# Patient Record
Sex: Female | Born: 1945 | Race: White | Hispanic: No | Smoking: Never smoker
Health system: Southern US, Community
[De-identification: ages and names within clinical notes are randomized; demographics above are authoritative.]

## PROBLEM LIST (undated history)

## (undated) DIAGNOSIS — N19 Unspecified kidney failure: Secondary | ICD-10-CM

## (undated) DIAGNOSIS — G4733 Obstructive sleep apnea (adult) (pediatric): Secondary | ICD-10-CM

## (undated) DIAGNOSIS — I509 Heart failure, unspecified: Secondary | ICD-10-CM

## (undated) DIAGNOSIS — I272 Pulmonary hypertension, unspecified: Secondary | ICD-10-CM

## (undated) DIAGNOSIS — I4891 Unspecified atrial fibrillation: Secondary | ICD-10-CM

## (undated) DIAGNOSIS — I340 Nonrheumatic mitral (valve) insufficiency: Secondary | ICD-10-CM

## (undated) DIAGNOSIS — I071 Rheumatic tricuspid insufficiency: Secondary | ICD-10-CM

## (undated) DIAGNOSIS — F410 Panic disorder [episodic paroxysmal anxiety] without agoraphobia: Secondary | ICD-10-CM

## (undated) DIAGNOSIS — I10 Essential (primary) hypertension: Secondary | ICD-10-CM

## (undated) DIAGNOSIS — E785 Hyperlipidemia, unspecified: Secondary | ICD-10-CM

## (undated) DIAGNOSIS — M1990 Unspecified osteoarthritis, unspecified site: Secondary | ICD-10-CM

## (undated) HISTORY — PX: TOTAL KNEE ARTHROPLASTY: SHX125

## (undated) HISTORY — PX: TONSILLECTOMY: SUR1361

## (undated) HISTORY — PX: HERNIA REPAIR: SHX51

---

## 2002-07-05 ENCOUNTER — Encounter: Admission: RE | Admit: 2002-07-05 | Discharge: 2002-07-05 | Payer: Self-pay | Admitting: *Deleted

## 2002-07-05 ENCOUNTER — Encounter: Admission: RE | Admit: 2002-07-05 | Discharge: 2002-10-03 | Payer: Self-pay | Admitting: *Deleted

## 2002-08-24 ENCOUNTER — Encounter: Admission: RE | Admit: 2002-08-24 | Discharge: 2002-08-24 | Payer: Self-pay | Admitting: *Deleted

## 2002-09-01 ENCOUNTER — Encounter: Admission: RE | Admit: 2002-09-01 | Discharge: 2002-11-30 | Payer: Self-pay | Admitting: *Deleted

## 2002-09-19 ENCOUNTER — Inpatient Hospital Stay (HOSPITAL_COMMUNITY): Admission: RE | Admit: 2002-09-19 | Discharge: 2002-09-24 | Payer: Self-pay | Admitting: *Deleted

## 2003-12-19 ENCOUNTER — Encounter (INDEPENDENT_AMBULATORY_CARE_PROVIDER_SITE_OTHER): Payer: Self-pay | Admitting: *Deleted

## 2003-12-19 ENCOUNTER — Observation Stay (HOSPITAL_COMMUNITY): Admission: RE | Admit: 2003-12-19 | Discharge: 2003-12-20 | Payer: Self-pay | Admitting: *Deleted

## 2007-11-03 HISTORY — PX: REPLACEMENT TOTAL KNEE: SUR1224

## 2008-06-27 ENCOUNTER — Inpatient Hospital Stay (HOSPITAL_COMMUNITY): Admission: RE | Admit: 2008-06-27 | Discharge: 2008-07-01 | Payer: Self-pay | Admitting: Orthopedic Surgery

## 2011-03-17 NOTE — Op Note (Signed)
NAME:  Kelly Dickson, Kelly Dickson              ACCOUNT NO.:  [id number]   MEDICAL RECORD NO.:  [id number]          PATIENT TYPE:  INP   LOCATION:  1617                         FACILITY:  [facility]   PHYSICIAN:  Ollen Gross, M.D.    DATE OF BIRTH:  03-22-1946   DATE OF PROCEDURE:  06/27/2008  DATE OF DISCHARGE:                               OPERATIVE REPORT   PREOPERATIVE DIAGNOSIS:  Osteoarthritis right knee.   POSTOPERATIVE DIAGNOSIS:  Osteoarthritis right knee.   PROCEDURE:  Right total knee arthroplasty.   SURGEON:  Dr. Lequita Halt   ASSISTANT:  Avel Peace PA-C   ANESTHESIA:  General with postop Marcaine pain pump.   ESTIMATED BLOOD LOSS:  Minimal.   DRAINS:  None.   TOURNIQUET TIME:  Tourniquet not utilized.   COMPLICATIONS:  None.   CONDITION.:  Stable to recovery.   CLINICAL NOTE:  Ms. Blakney is a 65 year old female with end-stage  arthritis of the right knee with progressively worsening pain and  dysfunction.  She has failed nonoperative management and presents for  total knee arthroplasty.   PROCEDURE IN DETAIL:  After successful administration of general  anesthetic a tourniquet placed on the right thigh and right lower  extremity prepped and draped in the usual sterile fashion.  She was  wrapped in Esmarch, knee flexed and tourniquet inflated to 300 mmHg.  Midline incision made with 10 blade through subcutaneous tissue to the  level of the extensor mechanism.  A fresh blade is used make a medial  parapatellar arthrotomy.  At this point it is obvious tourniquet is not  functioning and is serving as a venous tourniquet.  We subsequently  released the tourniquet after having up 3 minutes.  The bleeding was  minimal after I released the tourniquet.  The soft tissue over the  proximal medial tibia is subperiosteally elevated to the joint line with  the knife into the semimembranosus bursa with a Cobb elevator.  Soft  tissue laterally is elevated with attention being paid to avoid  the  patellar tendon on tibial tubercle.  Patella subluxed laterally, knee  flexed 90 degrees and ACL and PCL removed.  Drill was used for a  starting hole in the distal femur and canal was thoroughly irrigated.  The 5 degrees right valgus alignment guide placed referencing off the  posterior condyles, rotations marked on the block pinned to remove 11 mm  of distal femur.  I took 11 because of preop flexion contracture.  Distal femoral resection is made with an oscillating saw.  Sizing blocks  placed and size 4 is most appropriate in the AP plane, 3 most  appropriate in the mediolateral plane, thus 4 narrow will be the femoral  component used.  The size 4 cutting block is then placed with the  rotation marked at the epicondylar axis.  The anterior-posterior chamfer  cuts are made.   The tibia subluxed forward and the menisci removed.  The extramedullary  tibial alignment guide is placed referencing proximally at the medial  aspect of the tibial tubercle and distally along the second metatarsal  axis tibial crest.  The  block is pinned to remove 10 mm of the non  deficient lateral side.  Tibial resection is made with an oscillating  saw.  Size 3 is most appropriate tibial component and the proximal tibia  prepared the modular drill and keel punch for size 3.  Femoral  preparation is completed the intercondylar cut for the size 4.   Size 3 mobile bearing tibial trial, size 4 narrow posterior stabilized  femoral trial and 10-mm posterior stabilized rotating platform insert  trial placed.  With 10 full extension is achieved with excellent varus-  valgus anterior-posterior balance throughout full range of motion.  The  patella was everted, thickness measured to be 22 mm.  Freehand resection  taken to 13 mm, 35 template is placed, lug holes were drilled, trial  patella was placed and it tracks normally.  Osteophytes removed off the  posterior femur with the trial in place.  All trials removed  and the cut  bone surfaces prepared with pulsatile lavage.  Cement was mixed and once  ready for implantation the size 3 mobile bearing tibial tray size 4  narrow posterior stabilized femur and 35 patella are cemented into place  and patella is held with a clamp.  Trial 10-mm inserts placed, knee held  in full extension, all extruded cement removed.  Cement fully hardened  and the permanent 10 mm posterior stabilized rotating platform insert is  placed into the tibial tray.  Wound was copiously irrigated saline  solution and then FloSeal injected on the posterior capsule,  mediolateral gutters and suprapatellar area.  The moist sponge is placed  and then held for 2 minutes.  This effectively stopped all bleeding.  The wounds again irrigated and the arthrotomy closed with interrupted #1  PDS.  Flexion against gravity to 135 degrees.  Subcu closed with  interrupted 2-0 Vicryl subcuticular running 4-0 Monocryl.  Catheter for  Marcaine pain pump is placed and the pump initiated.  Steri-Strips and  bulky sterile dressing applied.  She is placed into a knee immobilizer,  awakened, transferred to recovery in stable condition.      Ollen Gross, M.D.  Electronically Signed     FA/MEDQ  D:  06/27/2008  T:  06/28/2008  Job:  [id number]

## 2011-03-17 NOTE — H&P (Signed)
NAME:  Kelly Dickson, Kelly Dickson              ACCOUNT NO.:  [id number]   MEDICAL RECORD NO.:  [id number]          PATIENT TYPE:  INP   LOCATION:  0006                         [facility]ACILITY:  [facility]   PHYSICIAN:  Ollen Gross, M.D.    DATE OF BIRTH:  1946-09-22   DATE OF ADMISSION:  06/27/2008  DATE OF DISCHARGE:                              HISTORY & PHYSICAL   CHIEF COMPLAINT:  Right knee pain.   HISTORY OF PRESENT ILLNESS:  The patient is a 65 year old female who has  seen by Dr. Lequita Halt for ongoing right knee pain.  She has known bone-on-  bone patellofemoral medial severe end-stage arthritis that has been  refractory to conservative management, now presents for surgery.  She  has been seen and felt to be stable for surgery.   ALLERGIES:  ALEVE.   CURRENT MEDICATIONS:  Lodine, Norvasc, Altace, Wellbutrin/bupropion,  HydroDIURIL.   PAST MEDICAL HISTORY:  1. Depression.  2. Hypertension.   PAST SURGICAL HISTORY:  1. Adenoids, tonsils.  2. Lipoma.  3. Gastric bypass.  4. Two ventral hernia repairs.   FAMILY HISTORY:  Father with abdominal aneurysm.  Mother with history of  severe hydration.  Maternal grandmother blood clot after hysterectomy.   SOCIAL HISTORY:  Divorced, retired [employer].  Nonsmoker.  Seldom intake of alcohol.  Lives alone.  Sister will be  assisting with care after surgery.   REVIEW OF SYSTEMS:  GENERAL:  No fevers, chills or night sweats.  NEUROLOGICAL:  No seizures, syncope or paralysis.  RESPIRATORY:  No  shortness breath, productive cough or hemoptysis.  Does have occasional  seasonal allergies.  CARDIOVASCULAR:  No chest pain, angina or  orthopnea. GASTROINTESTINAL:  No nausea, vomiting, diarrhea,  constipation.  GENITOURINARY:  Little bit of nocturia.  No dysuria,  hematuria.  MUSCULOSKELETAL:  Knee pain.   VITAL SIGNS:  Pulse 64, respirations 14, blood pressure 138/72.  GENERAL:  A 65 year old, white female well-nourished,  well-developed,  overweight, no acute distress, slightly anxious.  HEENT:  Normocephalic, atraumatic.  Pupils round and reactive.  Noted to  wear glasses.  EOMs intact.  NECK:  Supple.  CHEST:  Clear.  HEART:  Regular rate and rhythm with a faint systolic ejection murmur,  S1-S2.  ABDOMEN:  Soft, round, protuberant abdomen, bowel sounds present.  RECTAL-BREAST-GENITALIA:  Not done, not pertinent to present illness.  EXTREMITIES:  No effusion.  There is malalignment deformity, range of  motion 10-110, marked crepitus.   IMPRESSION:  Osteoarthritis, right knee.   PLAN:  The patient admitted to [facility] to undergo a right  total knee replacement arthroplasty.  Surgery will be performed by Dr.  Ollen Gross.      Alexzandrew L. Perkins, P.A.C.      Ollen Gross, M.D.  Electronically Signed    ALP/MEDQ  D:  06/26/2008  T:  06/27/2008  Job:  [id number]   cc:   Ollen Gross, M.D.  Fax: [phone]   Rema Fendt, N.P.  [facility]

## 2011-03-20 NOTE — Op Note (Signed)
   NAME:  Kelly Dickson, Kelly Dickson                        ACCOUNT NO.:  [id number]   MEDICAL RECORD NO.:  [id number]                   PATIENT TYPE:  INP   LOCATION:  X007                                 FACILITY:  [facility]   PHYSICIAN:  Sandria Bales. Ezzard Standing, M.D.               DATE OF BIRTH:  May 28, 1946   DATE OF PROCEDURE:  09/19/2002  DATE OF DISCHARGE:                                 OPERATIVE REPORT   PREOPERATIVE DIAGNOSIS:  Morbid obesity, planned gastrojejunal roux-en-Y  bypass.   POSTOPERATIVE DIAGNOSIS:  Morbid obesity, planned roux-en-Y gastrojejunal  bypass.   PROCEDURE:  Upper esophagoscopy.   SURGEON:  Kristine Garbe. Ezzard Standing, M.D.   ANESTHESIA:  General.   INDICATIONS FOR PROCEDURE:  The patient is undergoing a laparoscopic roux-en-  Y gastrojejunal bypass for morbid obesity, completed by Dr. Luan Pulling and  Dr. Smitty Cords Schermer.  They have now completed their stapled gastrojejunal  anastomosis, and I am endoscoping the patient for confirmation of the  anastomosis itself, the patency, and to recheck for leak.   DESCRIPTION OF PROCEDURE:  The patient had laparoscopes in. With pressure on  the abdomen, I passed the flexible Olympus endoscope without difficulty down  her mouth to the back of her throat. I identified  the esophagus down to the  GE junction which was right at 39 to 40 cm and got into the pouch. I did not  go through the anastomosis but visualized the anastomosis. I took photos of  the anastomosis. It looked like the anastomosis was about 2 to 3 cm below  the gastroesophageal junction. It was patent to the tune of approximately a  diameter of about 2 cm. There was no bleeding. There was no air leak. The  scope was then slowly withdrawn and it looked like a patent anastomosis  without leak and normal esophagus.   The patient tolerated the procedure well. Dr. Luan Pulling will dictate the  remainder of the operation; I am just doing the endoscopic portion.                                       Sandria Bales. Ezzard Standing, M.D.    DHN/MEDQ  D:  09/19/2002  T:  09/19/2002  Job:  [id number]   cc:   Vikki Ports, M.D.  [address], [address]  [place]  [place] [address]  Fax: [phone]

## 2011-03-20 NOTE — Discharge Summary (Signed)
NAME:  Kelly Dickson, Kelly Dickson              ACCOUNT NO.:  [id number]   MEDICAL RECORD NO.:  [id number]          PATIENT TYPE:  INP   LOCATION:  1617                         [facility]ACILITY:  [facility]   PHYSICIAN:  Ollen Gross, M.D.    DATE OF BIRTH:  [birth year]   DATE OF ADMISSION:  06/27/2008  DATE OF DISCHARGE:  07/01/2008                               DISCHARGE SUMMARY   ADMITTING DIAGNOSES:  1. Osteoarthritis, right knee.  2. Depression.  3. Hypertension.   DISCHARGE DIAGNOSES:  1. Osteoarthritis right knee status post right total knee replacement      arthroplasty.  2. Mild postop blood loss anemia, did not require transfusion.  3. Mild hypokalemia, improved.  4. Depression.  5. Hypertension.   PROCEDURE:  June 27, 2008, right total knee.  Surgeon, Dr. Lequita Halt.  Assistant, Avel Peace PA-C.  Anesthesia:  General with Marcaine pain  pump.   CONSULTATIONS:  None.   BRIEF HISTORY:  Ms. Seabolt is a 61-year female with end-stage arthritis  right knee, progressive worsening pain dysfunction, nonoperative  management, now presents for a total knee arthroplasty.   LABORATORY DATA:  Preop CBC showed hemoglobin of 13.2, hematocrit 40.0,  white cell count 8.0, platelets 287.  Serial CBCs followed.  Hemoglobin  dropped down to 9.9 then to 9.6.  last H&H was 9.1 and 27.2.  PT/PTT  preop 12.5 and 32, respectively.  INR 0.9.  Serial protimes followed.  PT/INR 24.0 and 2.0.  Chem panel on admission did show slightly low  potassium at 3.4.  Came back up to 3.9, last noted at 3.5.  Electrolytes  on the serial B-mets remained within normal limits.  Preop UA trace  ketones.  Otherwise, negative.  Blood group type B+.   EKG June 27, 2008, sinus bradycardia.  Septal infarct age  undetermined.  This is an unconfirmed EKG.   HOSPITAL COURSE:  The patient was admitted to [facility],  tolerated procedure well, later transferred to the recovery room on  orthopedic floor.  Started on PCA and p.o.  analgesic for pain control  following surgery.  Had some pain on the morning of day one but  controlled with medications.  Hemoglobin is down to 9.9.  Started on  iron.  Started back on blood pressure medications with parameters.  Blood pressure was elevated up to 200, was ranging from 150-200  postoperatively, so we did start her back on her home medications.  She  had not taken them as of yet postoperatively pressure did come under  better control, back down to 150 the next day, and had been under better  control once her medications were resumed.  Her therapy did well.  She  was up walking about 100 feet by day #2.  Dressing changed on day #2.  Incision looked good.  Hemoglobin was down just a little bit to 9.6, but  she was asymptomatic with this, continued on the iron.  Continued to  progress with physical therapy through day #3 and day #4.  She is up  walking 100 feet and 350 feet.  Encouraged medications and was  discharged home on June 04, 2008.   DISCHARGE/PLAN:  1. The patient discharged home on July 01, 2008.  2. Discharge diagnoses please see above.  3. Discharge medications:  Percocet, Robaxin, Nu-Iron, Coumadin.   DISCHARGE INSTRUCTIONS:  1. Activity:  Weightbearing as tolerated right lower extremity, total      knee protocol.  2. Follow-up 2 weeks.  3. Diet:  Heart-healthy diet.   DISPOSITION:  Home.   CONDITION ON DISCHARGE:  Improved.      Alexzandrew L. Perkins, P.A.C.      Ollen Gross, M.D.  Electronically Signed    ALP/MEDQ  D:  07/26/2008  T:  07/27/2008  Job:  [id number]   cc:   [facility] Rema Fendt, NP

## 2011-03-20 NOTE — Op Note (Signed)
NAME:  Kelly Dickson, Kelly Dickson                        ACCOUNT NO.:  [id number]   MEDICAL RECORD NO.:  [id number]                   PATIENT TYPE:  INP   LOCATION:  X007                                 FACILITY:  [facility]   PHYSICIAN:  Vikki Ports, M.D.         DATE OF BIRTH:  09-01-1946   DATE OF PROCEDURE:  09/19/2002  DATE OF DISCHARGE:                                 OPERATIVE REPORT   PREOPERATIVE DIAGNOSES:  1. Morbid obesity.  2. Ventral hernia.   POSTOPERATIVE DIAGNOSES:  1. Morbid obesity.  2. Ventral hernia.   PROCEDURE:  Laparoscopic Roux-en-Y gastric bypass, retrocolic antegastric  gastrojejunostomy with linear cutter.   SURGEON:  Vikki Ports, M.D.   ASSISTANT:  Sandria Bales. Ezzard Standing, M.D.   PROCTOR:  Gus Puma, M.D. [facility]).   ANESTHESIA:  General.   DESCRIPTION OF PROCEDURE:  The patient was taken to the operating room and  placed in a supine position.  After adequate anesthesia was induced using  endotracheal tube, Foley catheter was placed and the abdomen was prepped and  draped in the normal sterile fashion.  Using a Veress needle in the left  upper quadrant, pneumoperitoneum was obtained and then a blunt 5 mm trocar  was placed in the left upper quadrant.  Ventral hernia was identified.  A  small 5 mm trocar was placed in the left abdomen.  Contents of the hernia  sac were taken down.  The edges of the hernia defect could be visualized.  It measured about 16 x 16 cm.  Under direct visualization a 12 mm port was  placed in the infraumbilical region, and two additional 12 mm ports were  placed in the left upper quadrant.  Dissection began by identifying the  ligament of Treitz by retracting the transverse colon anteriorly.  The  ligament of Treitz was identified and measuring about 30 cm distal from the  ligament of Treitz, the small bowel was divided using a white-loaded GIA  stapling device.  Mesentery was also taken down with  two additional firings  of the GIA stapler.  Adequate hemostasis was ensured.  The distal divided  segment was then marked with a Penrose drain and 100 cm was calculated  distal to that division.  A side-to-side jejunojejunostomy was accomplished  using two firings of the white load GIA stapling device.  The defect was  closed with a running 2-0 Vicryl suture using the EndoStitch device.  Mesenteric defect was closed with interrupted 2-0 silk sutures.  At this  point a small defect in the transverse mesocolon was made just anterior and  lateral to the ligament of Treitz.  The Roux limb was then passed up behind  the transverse colon and into the lesser sac.   I then turned my attention to the stomach.  The EG junction was identified.  Then using the Harmonic scalpel, dissection of the angle of His was  undertaken.  I  had good visualization of the proximal portion of the spleen.  The gastrohepatic omentum was incised and an area about 4 cm distal to the  EG junction  on the lesser curve was identified.  Blunt and sharp dissection  was undertaken posterior to the stomach up to the angle of His.  The gastric  pouch was created using four firings of the GIA blue load and stapling  device.  This was accomplished with a 34 Ewald tube placed transorally.  After complete division of the gastric pouch, the Roux limb was difficult to  pull up posterior to the stomach, and therefore the gastrocolic ligament was  incised.  The Roux limb was visualized there.  There were a lot of adhesions  behind the stomach to the pancreas; therefore, I opted to bring the Roux  limb up anterior to the stomach wall.  After anchoring the Roux limb to the  gastric pouch, a side-to-side anastomosis was performed after a running 2-0  Vicryl stitch layer was placed in the posterior wall.  The stapling device  was fired once.  The anastomosis site appeared adequate.  The defect was  closed then with a running 2-0 Vicryl  suture using the EndoStitch device.  The anterior suture line was then reinforced with a running 2-0 Vicryl  suture.   Through a 5 mm incision in the right subcostal region, a liver retractor had  previously been placed and retracted the liver anteriorly.  After the  anastomosis was completed, endoscopy was performed by Dr. Ovidio Kin that  will be dictated in a separate report.  Endoscopy showed no evidence of  leak.  The upper abdomen was irrigated with saline, and the gastric pouch  and anastomosis were distended after distal obstruction.  There was no  evidence of leak.  The Roux limb was then tacked both medially and laterally  to the transverse mesocolon, and the jejunojejunostomy was also tacked just  distal to the anastomosis with an interrupted 2-0 silk suture.  I opted not  to repair the hernia at this time.  Adequate hemostasis was ensured.  The  fascial defects were closed using an EndoStitch device with interrupted 0  Vicryl suture.  Skin incisions were closed with subcutaneous 3-0 Vicryl and  the skin was closed with staples.  The patient tolerated the procedure well  and went to the PACU in good condition.                                               Vikki Ports, M.D.    KRH/MEDQ  D:  09/19/2002  T:  09/19/2002  Job:  [id number]   cc:   Teena Irani. Arlyce Dice, M.D.  [address]  [place]  [place] [address]  Fax: [phone]

## 2011-03-20 NOTE — Op Note (Signed)
NAME:  Kelly Dickson, Kelly Dickson                        ACCOUNT NO.:  [id number]   MEDICAL RECORD NO.:  [id number]                   PATIENT TYPE:  OBV   LOCATION:  0343                                 FACILITY:  [facility]   PHYSICIAN:  Vikki Ports, M.D.         DATE OF BIRTH:  Aug 29, 1946   DATE OF PROCEDURE:  12/19/2003  DATE OF DISCHARGE:  12/20/2003                                 OPERATIVE REPORT   PREOPERATIVE DIAGNOSES:  1. Ventral hernia.  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   POSTOPERATIVE DIAGNOSES:  1. Ventral hernia.  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   OPERATION/PROCEDURE:  1. Laparoscopic ventral hernia repair with mesh.  2. Excision of giant lipoma of the right upper quadrant.   SURGEON:  Vikki Ports, M.D.   ANESTHESIA:  General.   ASSISTANT:  Thornton Park. Daphine Deutscher, M.D.   DESCRIPTION OF PROCEDURE:  The patient was taken to the operating room and  placed in the supine position.  After adequate general anesthesia was  induced using the endotracheal tube, the abdomen was prepped and draped in  the normal sterile fashion.  Using a 12 mm incision in the left subcostal  region and Optiview technique, trocar was introduced into the abdomen.  Pneumoperitoneum was obtained.  Under direct visualization an additional 12  mm port was placed in the left lower quadrant and a 5 mm port in the right  lower quadrant.  Hernia was identified. Contents were reduced both by manual  palpation and traction intra-abdominally.  This was easily reduced.  A few  adhesions were taken down and the defect measured 8 cm x 8 cm.  A 12 cm  circular piece of Parietex dual-facing mesh was placed in the abdomen after  #1 Novofils had been placed in the periphery.  Those sutures were brought  out through the anterior fascia using a suture grasper.  These were ligated  down to the anterior fascia until the entire defect was closed.  The  periphery was tacked with a stapling  device.  I was satisfied with the  coverage of the hernia repair.  Adequate hemostasis was insured,  pneumoperitoneum was released.  Trocars were removed and the skin incisions  were closed with staples.   Right upper quadrant incision was made over the palpable lipoma.  It was  multilobulated and required significant dissection to remove adhesive bands.  The largest amount was removed en bloc but a number of smaller lipomas  were also excised.  Because of the large cavity, a drain was placed in the  cavity and the skin was closed with staples.  Sterile dressings were  applied.   The patient tolerated the procedure well and went to PACU in good condition.  Vikki Ports, M.D.    KRH/MEDQ  D:  12/20/2003  T:  12/21/2003  Job:  [id number]

## 2011-03-20 NOTE — Discharge Summary (Signed)
   NAME:  Kelly Dickson, Kelly Dickson                        ACCOUNT NO.:  [id number]   MEDICAL RECORD NO.:  [id number]                   PATIENT TYPE:  INP   LOCATION:  0473                                 FACILITY:  [facility]   PHYSICIAN:  Vikki Ports, M.D.         DATE OF BIRTH:  1946/04/06   DATE OF ADMISSION:  09/19/2002  DATE OF DISCHARGE:  09/24/2002                                 DISCHARGE SUMMARY   ADMISSION DIAGNOSES:  Morbid obesity.   DISCHARGE DIAGNOSES:  Morbid obesity.   PROCEDURE:  Laparoscopic Roux-en-Y gastric bypass.   CONSULTATIONS:  Juluis Mire, M.D., Meade Maw, M.D.   CONDITION ON DISCHARGE:  Good and improved.   FOLLOW UP:  With me seven days after surgery.   DISCHARGE MEDICATIONS:  Roxicet elixir 5-10 cubic centimeters p.o. q.4h.  p.r.n. pain.   DIET:  Strict close bariatric laparoscopic bypass diet as given to the  patient.   HISTORY OF PRESENT ILLNESS:  The patient is a 65 year old white female who  underwent extensive preoperative evaluation for morbid obesity.  The patient  presents for laparoscopic Roux-en-Y gastric bypass.  For the remainder of  the thorough H&P, please see the chart.   HOSPITAL COURSE:  The patient was admitted after home bowel prep, taken to  the operating room where she underwent laparoscopic Roux-en-Y gastric  bypass.  Endoscopy showed no evidence of leak and upper GI postoperatively  was normal as well.  The patient's hypertension required the increase of a  clonidine patch postoperatively and hypokalemia required supplemental  potassium.  The patient had minimal nausea over the first one to two days,  was started on a few ice chips and sips of Kool-Aid and Glucerna by  postoperative day number two.  Hypertension continued to improve over the  next three to four days requiring decrease of her clonidine patch.  On  postoperative day number four patient was tolerating her diet, was  ambulating well.  Dopplers showed no  evidence of lower extremity DVT and by postoperative day number five she was  feeling well, ambulating, was ready for discharge home.   FOLLOW UP:  With me one week after discharge.                                               Vikki Ports, M.D.    KRH/MEDQ  D:  11/22/2002  T:  11/22/2002  Job:  [id number]   cc:   Teena Irani. Arlyce Dice, M.D.  [id number]  [place]  [place] [address]  Fax: [phone]

## 2011-07-13 ENCOUNTER — Encounter (HOSPITAL_COMMUNITY): Payer: Medicare Other

## 2011-07-13 ENCOUNTER — Other Ambulatory Visit: Payer: Self-pay | Admitting: Orthopedic Surgery

## 2011-07-13 ENCOUNTER — Other Ambulatory Visit (HOSPITAL_COMMUNITY): Payer: Self-pay | Admitting: Orthopedic Surgery

## 2011-07-13 ENCOUNTER — Ambulatory Visit (HOSPITAL_COMMUNITY)
Admission: RE | Admit: 2011-07-13 | Discharge: 2011-07-13 | Disposition: A | Discharge status: Home / Self Care | Payer: Medicare Other | Source: Ambulatory Visit | Attending: Orthopedic Surgery | Admitting: Orthopedic Surgery

## 2011-07-13 DIAGNOSIS — I10 Essential (primary) hypertension: Secondary | ICD-10-CM | POA: Insufficient documentation

## 2011-07-13 DIAGNOSIS — Z01810 Encounter for preprocedural cardiovascular examination: Secondary | ICD-10-CM | POA: Insufficient documentation

## 2011-07-13 DIAGNOSIS — M1710 Unilateral primary osteoarthritis, unspecified knee: Secondary | ICD-10-CM | POA: Insufficient documentation

## 2011-07-13 DIAGNOSIS — R9431 Abnormal electrocardiogram [ECG] [EKG]: Secondary | ICD-10-CM | POA: Insufficient documentation

## 2011-07-13 DIAGNOSIS — I517 Cardiomegaly: Secondary | ICD-10-CM | POA: Insufficient documentation

## 2011-07-13 DIAGNOSIS — Z01812 Encounter for preprocedural laboratory examination: Secondary | ICD-10-CM | POA: Insufficient documentation

## 2011-07-13 DIAGNOSIS — Z01818 Encounter for other preprocedural examination: Secondary | ICD-10-CM | POA: Insufficient documentation

## 2011-07-13 LAB — COMPREHENSIVE METABOLIC PANEL
ALT: 15 U/L (ref 0–35)
BUN: 16 mg/dL (ref 6–23)
CO2: 28 mEq/L (ref 19–32)
Calcium: 9.8 mg/dL (ref 8.4–10.5)
Creatinine, Ser: 0.5 mg/dL (ref 0.50–1.10)
GFR calc Af Amer: >60 mL/min (ref >60)
GFR calc non Af Amer: >60 mL/min (ref >60)
Glucose, Bld: 109 mg/dL — ABNORMAL HIGH (ref 70–99)
Total Protein: 7.2 g/dL (ref 6.0–8.3)

## 2011-07-13 LAB — URINALYSIS, ROUTINE W REFLEX MICROSCOPIC
Glucose, UA: NEGATIVE mg/dL
Ketones, ur: NEGATIVE mg/dL
Nitrite: NEGATIVE
Protein, ur: NEGATIVE mg/dL
Urobilinogen, UA: 0.2 mg/dL (ref 0.0–1.0)

## 2011-07-13 LAB — PROTIME-INR: Prothrombin Time: 13 seconds (ref 11.6–15.2)

## 2011-07-13 LAB — CBC
Hemoglobin: 11.6 g/dL — ABNORMAL LOW (ref 12.0–15.0)
MCH: 27.6 pg (ref 26.0–34.0)
MCHC: 31.9 g/dL (ref 30.0–36.0)
Platelets: 266 10*3/uL (ref 150–400)
RDW: 13.9 % (ref 11.5–15.5)

## 2011-07-13 LAB — APTT: aPTT: 33 seconds (ref 24–37)

## 2011-07-13 LAB — URINE MICROSCOPIC-ADD ON

## 2011-07-20 ENCOUNTER — Inpatient Hospital Stay (HOSPITAL_COMMUNITY)
Admission: RE | Admit: 2011-07-20 | Discharge: 2011-07-23 | DRG: 470 | Disposition: A | Discharge status: Skilled Nursing Facility | Payer: Medicare Other | Source: Ambulatory Visit | Attending: Orthopedic Surgery | Admitting: Orthopedic Surgery

## 2011-07-20 DIAGNOSIS — Z01812 Encounter for preprocedural laboratory examination: Secondary | ICD-10-CM

## 2011-07-20 DIAGNOSIS — Z9884 Bariatric surgery status: Secondary | ICD-10-CM

## 2011-07-20 DIAGNOSIS — M899 Disorder of bone, unspecified: Secondary | ICD-10-CM | POA: Diagnosis present

## 2011-07-20 DIAGNOSIS — Z780 Asymptomatic menopausal state: Secondary | ICD-10-CM

## 2011-07-20 DIAGNOSIS — I10 Essential (primary) hypertension: Secondary | ICD-10-CM | POA: Diagnosis present

## 2011-07-20 DIAGNOSIS — Z96659 Presence of unspecified artificial knee joint: Secondary | ICD-10-CM

## 2011-07-20 DIAGNOSIS — E876 Hypokalemia: Secondary | ICD-10-CM | POA: Diagnosis not present

## 2011-07-20 DIAGNOSIS — E871 Hypo-osmolality and hyponatremia: Secondary | ICD-10-CM | POA: Diagnosis not present

## 2011-07-20 DIAGNOSIS — Z79899 Other long term (current) drug therapy: Secondary | ICD-10-CM

## 2011-07-20 DIAGNOSIS — D62 Acute posthemorrhagic anemia: Secondary | ICD-10-CM | POA: Diagnosis not present

## 2011-07-20 DIAGNOSIS — M1710 Unilateral primary osteoarthritis, unspecified knee: Principal | ICD-10-CM | POA: Diagnosis present

## 2011-07-20 DIAGNOSIS — F329 Major depressive disorder, single episode, unspecified: Secondary | ICD-10-CM | POA: Diagnosis present

## 2011-07-20 DIAGNOSIS — M21169 Varus deformity, not elsewhere classified, unspecified knee: Secondary | ICD-10-CM | POA: Diagnosis present

## 2011-07-20 DIAGNOSIS — F3289 Other specified depressive episodes: Secondary | ICD-10-CM | POA: Diagnosis present

## 2011-07-20 LAB — TYPE AND SCREEN
ABO/RH(D): B POS
Antibody Screen: NEGATIVE

## 2011-07-21 LAB — CBC
HCT: 28.4 % — ABNORMAL LOW (ref 36.0–46.0)
MCV: 86.9 fL (ref 78.0–100.0)
Platelets: 203 10*3/uL (ref 150–400)
RBC: 3.27 MIL/uL — ABNORMAL LOW (ref 3.87–5.11)
WBC: 8.4 10*3/uL (ref 4.0–10.5)

## 2011-07-21 LAB — BASIC METABOLIC PANEL
CO2: 27 mEq/L (ref 19–32)
Chloride: 100 mEq/L (ref 96–112)
Creatinine, Ser: 0.48 mg/dL — ABNORMAL LOW (ref 0.50–1.10)
Potassium: 4.1 mEq/L (ref 3.5–5.1)

## 2011-07-22 LAB — BASIC METABOLIC PANEL
BUN: 7 mg/dL (ref 6–23)
Calcium: 8.7 mg/dL (ref 8.4–10.5)
Chloride: 97 mEq/L (ref 96–112)
Creatinine, Ser: 0.5 mg/dL (ref 0.50–1.10)
GFR calc Af Amer: >60 mL/min (ref >60)

## 2011-07-22 LAB — CBC
HCT: 27.8 % — ABNORMAL LOW (ref 36.0–46.0)
Hemoglobin: 9 g/dL — ABNORMAL LOW (ref 12.0–15.0)
MCV: 86.6 fL (ref 78.0–100.0)
RDW: 13.6 % (ref 11.5–15.5)
WBC: 11.5 10*3/uL — ABNORMAL HIGH (ref 4.0–10.5)

## 2011-07-22 NOTE — Op Note (Signed)
NAMEMarland Dickson  AUTUM, BENFER NO.:  [id number]  MEDICAL RECORD NO.:  [id number]  LOCATION:  X003                         FACILITY:  [facility]  PHYSICIAN:  Ollen Gross, M.D.    DATE OF BIRTH:  February 27, 1946  DATE OF PROCEDURE:  07/20/2011 DATE OF DISCHARGE:                              OPERATIVE REPORT   PREOPERATIVE DIAGNOSIS:  Osteoarthritis, left knee.  POSTOPERATIVE DIAGNOSIS:  Osteoarthritis, left knee.  PROCEDURE:  Left total knee arthroplasty.  SURGEON:  Ollen Gross, MD  ASSISTANT:  Alexzandrew L. Perkins, PA-C  ANESTHESIA:  General.  ESTIMATED BLOOD LOSS:  Minimal.  TOURNIQUET TIME:  17 minutes at 300 mmHg.  COMPLICATIONS:  None.  CONDITION:  Stable to Recovery.  BRIEF CLINICAL NOTE:  Kelly Dickson is a 65 year old female with advanced end- stage arthritis of her left knee with severe bone on bone change and severe varus deformity.  She has had a previous successful right total knee.  She has failed injections and other nonoperative management on the left knee.  She presents for left total knee arthroplasty.  PROCEDURE IN DETAIL:  After successful administration of general anesthetic, a tourniquet was placed high on her left thigh, and her left lower extremity was prepped and draped in usual sterile fashion. Extremity was wrapped in an Esmarch, knee flexed, tourniquet inflated to 300 mmHg.  A midline incision was made with a 10 blade through subcutaneous tissue to the level of the extensor mechanism.  A fresh blade was used to make a medial parapatellar arthrotomy.  Soft tissue on the proximal medial tibia subperiosteally elevated to the joint line with the knife into the semimembranosus bursa with a Cobb elevator. Soft tissue laterally was elevated with attention being paid to avoid any patellar tendon on tibial tubercle.  Patella was everted, the flexed 90 degrees, and ACL and PCL removed.  She has severe bone on bone change in medial compartment with  a large erosion in the medial proximal tibia. A drill was used to create a starting hole in the distal femur and the canal was thoroughly irrigated.  The 5-degree left valgus alignment guide was placed and a distal femoral cutting block was pinned to remove 11 mm off the distal femur.  Distal femoral resection was made with an oscillating saw.  The tibia was then subluxed forward and the menisci removed.  She has a massive defect medially.  Extramedullary alignment guide was placed referencing proximally at the medial aspect of tibial tubercle and distally along the 2nd metatarsal axis and tibial crest.  If I was going to resect all the way down to the bottom of the defect that would have created 2 larger resection level and very large flexion/extension gaps. I resected at the normal level which was about 8 mm off the less deficient lateral side.  Resection was made with an oscillating saw. She though has a fairly significant defect medially.  I prepared the proximal tibia with a modular drill and keel punched for a size 2.5.  I then resected additional 5 mm of the medial side for placement of a 5 mm wedge medially.  We then drilled down deeper into the tibia for placement of the  M.B.T. revision stem.  We did set up a trial with a size 2.5 with a 5 mm augment medially.  This was impacted with perfect fit on the tibia.  We then used a keel punch to stabilize it.  The femoral sizing guide was placed and size 3 was most appropriate. Rotation was marked off the epicondylar axis.  Size 3 cutting block was placed and the anterior and posterior chamfer cuts were made. Intercondylar block was placed and that cut was made.  Trial size 3 posterior stabilized femur was then placed.  A 10 mm posterior stabilized rotating platform insert trial was placed.  With the 10, full extension was achieved with excellent varus-valgus, anterior-posterior balance, throughout full range of motion.  The patella  was everted and the thickness was measured to be 22 mm.  Freehand resection was taken to 12 mm, 35 template was placed, lug holes were drilled, trial patella was placed, and it tracked normally.  Osteophytes were removed off the posterior femur with the trial in place.  All trials were removed and the cut bone surfaces were prepared with pulsatile lavage.  Cement was mixed and once ready for implantation, the size 2.5 M.B.T. revision tray with a 5 mm augment medially was cemented into the tibia and impacted in place.  All extruded cement was removed.  Size 3 posterior stabilized femur and 35 patella were also cemented into place and the patella was held with a clamp.  Trial 10 mm insert was placed, knee held in full extension, and all extruded cement removed.  When the cement was fully hardened, then the permanent 10 mm posterior stabilized rotating platform insert was placed in the tibial tray.  Wound was copiously irrigated with saline solution and the arthrotomy closed over Hemovac drain with interrupted #1 PDS.  Flexion against gravity was 135 degrees and patella tracked normally. The tourniquet had been released at 17 minutes because it was not functioning.  Note that we did the majority of the case with the tourniquet down.  Subcutaneous was then closed with interrupted 2-0 Vicryl and subcuticular with running 4-0 Monocryl. Catheter for Marcaine pain pump was placed and pump was initiated. Steri-Strips and a bulky sterile dressing applied and she was placed into a knee immobilizer, awakened, and transported to Recovery in stable condition.  Please note that it was medical necessity to have a surgical assistant on this procedure in order to safely and expeditiously perform it. Surgical assistant was necessary for retraction of ligaments and vital neurovascular structures as well as positioning of the leg to allow for appropriately aligned implants.     Ollen Gross,  M.D.     FA/MEDQ  D:  07/20/2011  T:  07/20/2011  Job:  [id number]  Electronically Signed by Ollen Gross M.D. on 07/22/2011 10:10:02 AM

## 2011-07-23 LAB — BASIC METABOLIC PANEL
BUN: 9 mg/dL (ref 6–23)
Creatinine, Ser: <0.47 mg/dL — ABNORMAL LOW (ref 0.50–1.10)
Potassium: 3.4 mEq/L — ABNORMAL LOW (ref 3.5–5.1)

## 2011-07-23 LAB — CBC
HCT: 26.7 % — ABNORMAL LOW (ref 36.0–46.0)
MCHC: 32.6 g/dL (ref 30.0–36.0)
Platelets: 195 10*3/uL (ref 150–400)
RDW: 13.6 % (ref 11.5–15.5)

## 2011-07-29 NOTE — H&P (Signed)
NAME:  Kelly Dickson, STOFFEL NO.:  [id number]  MEDICAL RECORD NO.:  [id number]  LOCATION:                               FACILITY:  [facility]  PHYSICIAN:  Ollen Gross, M.D.    DATE OF BIRTH:  02-Jun-1946  DATE OF ADMISSION:  07/20/2011 DATE OF DISCHARGE:                             HISTORY & PHYSICAL   CHIEF COMPLAINT:  Left knee pain.  HISTORY OF PRESENT ILLNESS:  The patient is a 65 year old female who has previously undergone a right total knee back in August 2009.  Right knee is doing well, but the left knee continues to be problematic.  She is known to have end-stage arthritis and has already developed bone on bone.  She has been treated conservatively in the past for knee with analgesics including injections.  Despite conservative measures, she has progressive pain.  It is felt she would benefit from undergoing surgical intervention.  Risks and benefits have been discussed, she elected to proceed with surgery.  She does not have any contraindications for the surgery such as ongoing infection or progressive neurological disease.  ALLERGIES:  No known drug allergies.  CURRENT MEDICATIONS:  Ramipril, amlodipine, hydrochlorothiazide, oxycodone.  PAST MEDICAL HISTORY: 1. Hypertension. 2. History of depression. 3. Osteopenia. 4. Postmenopausal. 5. Childhood illnesses measles, mumps.  PAST SURGICAL HISTORY:  Tonsils and adenoids surgery in 1950s, tubal ligation in 1971, gastric bypass surgery in 2003, ventral hernia in 2005, ventral hernia repair again in 2007, right total knee replacement in August 2009.  SOCIAL HISTORY:  Divorced, nonsmoker, no alcohol, 4 children.  She does want to look into skilled nursing facility, possibly [facility].  She has 9 steps going into the front of her house and 17 steps coming in from her garage.  FAMILY HISTORY:  Father with an abdominal aneurysm.  Mother with severe dehydration, problems during a rehab stay at assisted  living.  REVIEW OF SYSTEMS:  GENERAL:  No fever, chills, night sweats.  NEURO: No seizures, syncope, or paralysis.  RESPIRATORY:  No shortness of breath, productive cough, or hemoptysis.  CARDIOVASCULAR:  No chest pain, orthopnea.  GI:  No nausea, vomiting, diarrhea, or constipation. GU:  No dysuria, hematuria, or discharge.  MUSCULOSKELETAL:  Knee pain.  PHYSICAL EXAMINATION:  VITAL SIGNS:  Pulse 72, respirations 14, blood pressure 173/88. GENERAL:  A 65 year old white female well nourished, well developed, overweight, obese, hip and thigh obesity.  She is alert, oriented, cooperative, pleasant. HEENT:  Normocephalic, atraumatic.  Pupils round, reactive.  EOMs intact. NECK:  Supple. CHEST:  Clear. HEART:  Heart is regular rate and rhythm with a grade 2/6 systolic ejection murmur, S1 and S2 noted. ABDOMEN:  Soft, round, protuberant abdomen.  Bowel sounds present. RECTAL/BREAST/GENITALIA:  Not done, not pertinent to present illness. EXTREMITIES:  Left knee range of motion 5 to 120, marked crepitus, tender more medial than lateral.  No instability.  IMPRESSION:  Osteoarthritis, left knee.  PLAN:  The patient will be admitted to [facility] to undergo a left total knee replacement arthroplasty.  Surgery will be performed by Dr. Ollen Gross.  She does want to look into [facility] following her hospital stay.  Alexzandrew L. Julien Girt, P.A.C.   ______________________________ Ollen Gross, M.D.    ALP/MEDQ  D:  07/19/2011  T:  07/19/2011  Job:  [id number]  cc:   Rema Fendt, FNP [facility]  Electronically Signed by Patrica Duel P.A.C. on 07/23/2011 11:34:59 AM Electronically Signed by Ollen Gross M.D. on 07/29/2011 10:33:12 AM

## 2011-08-12 NOTE — Discharge Summary (Signed)
NAMEMarland Kitchen  Kelly Dickson, Kelly Dickson              ACCOUNT NO.:  [id number]  MEDICAL RECORD NO.:  [id number]  LOCATION:  1609                         FACILITY:  [facility]  PHYSICIAN:  Alexzandrew L. Perkins, P.A.C.DATE OF BIRTH:  1946/10/23  DATE OF ADMISSION:  07/20/2011 DATE OF DISCHARGE:  07/23/2011                        DISCHARGE SUMMARY - REFERRING   ADMITTING DIAGNOSES: 1. Osteoarthritis, left knee. 2. Hypertension. 3. History of depression. 4. Osteopenia. 5. Postmenopausal. 6. Childhood illnesses of measles and mumps.  DISCHARGE DIAGNOSES: 1. Osteoarthritis, left knee, status post left total knee replacement     and arthroplasty. 2. Mild postop acute blood loss anemia, did not require transfusion. 3. Mild postop hyponatremia, stable. 4. Mild postop hypokalemia. 5. Hypertension. 6. History of depression. 7. Osteopenia. 8. Postmenopausal. 9. Childhood illnesses of measles and mumps.  PROCEDURE:  July 20, 2011, left total knee; surgeon Dr. Lequita Halt; assistant Alexzandrew Julien Girt, PA-C; anesthesia general; tourniquet time 17 minutes.  CONSULTS:  None.  BRIEF HISTORY:  The patient is a 65 year old female with advanced arthritis of left knee with severe bone-on-bone changes and severe varus deformity, successful right total knee and now presents for left total knee.  LABORATORY DATA:  Preop CBC showed a hemoglobin of 11.6, hematocrit 36.4, white cell count 7.8 and platelets 266.  PT/INR preop 13.0 and 0.96 with a PTT of 33.  Chem panel all within normal limits.  Preop UA, small leukocytes, rare squamous 7-10 white cells, rare bacteria.  Blood group type B+.  Nasal swabs were positive for Staph aureus, but negative for MRSA.  Serial CBCs were followed.  Hemoglobin dropped down from 9.1 to 9, last night H and H was 8.7 and 26.7.  Serial BMETs were followed for 3 days.  Sodium dropped from 138 to 132, stabilized at 132. Potassium dropped from 3.8 to 3.4.  Remaining of the  electrolytes remained within normal limits.  Glucose went up from 109 up to 147, was trending back down to 143.  HOSPITAL COURSE:  The patient was admitted to [facility], taken to OR, underwent above-stated procedure without complication.  The patient tolerated the procedure well, later transferred to recovery room of the orthopedic floor, started Xarelto for DVT prophylaxis, given 24 hours postop IV antibiotics.  Had a little bit of itching through the night which she was treated with Benadryl.  She did want to look into [facility].  We got social work involved postoperatively.  Hemovac drain was pulled and discontinued on postop day #1.  Her blood count was a little low, so we put her on some iron supplementation.  Sodium was a little low, felt be a dilutional component.  She started diuresing fluids well, though, was started back on home meds.  She did a little stand pivot on day #1; by day #2, she was still a little bit better, she started actually walking about 30 feet in the morning, got up to about 70 feet the afternoon.  We did change the dressing on day #2 and the incision looked good.  We stopped her fluids.  Sodium was at 132.  Other electrolytes remained within normal limits.  Hemoglobin was 9 at that point and she was asymptomatic.  Continued to  progress well.  She was seen on rounds on postop day #3 by Dr. Lequita Halt.  She was doing fine, no complaints and it was decided that she be transferred over to [facility] for continued postoperative care.  DISCHARGE/PLAN:  The patient was discharged over to [facility] on 07/23/2011.  DISCHARGE DIAGNOSES:  Please see above.  DISCHARGE MEDICATIONS:  Current medications at time of transfer include; 1. Xarelto 10 mg p.o. daily for 3 weeks, then discontinue the Xarelto. 2. Colace 100 mg p.o. b.i.d. 3. Hydrochlorothiazide 12.5 mg daily. 4. Altace 10 mg p.o. daily. 5. Norvasc 5 mg p.o. daily. 6. Nu-Iron 150 mg p.o.  daily for 3 weeks, then discontinue the Nu-     Iron. 7. OxyIR 5 mg 1 or 2 tablets every 4 hours as needed for moderate-to-     severe pain. 8. Tylenol 325 1 or 2 every 4-6 hours as needed for mild to moderate     pain. 9. Laxative of choice. 10.Enema of choice. 11.Robaxin 500 mg p.o. q.6-8 h. p.r.n. spasm.  DIET:  Heart-healthy diet.  ACTIVITY:  She is weightbearing as tolerated, total knee protocol.  PT and OT for gait training, ambulation, ADLs, range of motion and strengthening exercises per continued total knee protocol.  Please note that the patient may start showering, however, do not submerge incision under water.  FOLLOWUP:  2 weeks.  DISPOSITION:  [facility].  CONDITION ON DISCHARGE:  Improved.     Alexzandrew L. Perkins, P.A.C.     ALP/MEDQ  D:  07/23/2011  T:  07/23/2011  Job:  [id number]  cc:   Rema Fendt, FNP [facility]  Electronically Signed by Patrica Duel P.A.C. on 07/23/2011 11:34:47 AM Electronically Signed by Ollen Gross M.D. on 08/12/2011 11:15:52 AM

## 2015-11-25 ENCOUNTER — Emergency Department (HOSPITAL_BASED_OUTPATIENT_CLINIC_OR_DEPARTMENT_OTHER): Payer: Medicare Other

## 2015-11-25 ENCOUNTER — Inpatient Hospital Stay (HOSPITAL_BASED_OUTPATIENT_CLINIC_OR_DEPARTMENT_OTHER)
Admission: EM | Admit: 2015-11-25 | Discharge: 2015-11-30 | DRG: 603 | Disposition: A | Discharge status: Home / Self Care | Payer: Medicare Other | Attending: Internal Medicine | Admitting: Internal Medicine

## 2015-11-25 ENCOUNTER — Encounter (HOSPITAL_BASED_OUTPATIENT_CLINIC_OR_DEPARTMENT_OTHER): Payer: Self-pay

## 2015-11-25 DIAGNOSIS — I340 Nonrheumatic mitral (valve) insufficiency: Secondary | ICD-10-CM | POA: Diagnosis present

## 2015-11-25 DIAGNOSIS — I82812 Embolism and thrombosis of superficial veins of left lower extremities: Secondary | ICD-10-CM | POA: Diagnosis present

## 2015-11-25 DIAGNOSIS — E876 Hypokalemia: Secondary | ICD-10-CM | POA: Diagnosis present

## 2015-11-25 DIAGNOSIS — E785 Hyperlipidemia, unspecified: Secondary | ICD-10-CM | POA: Insufficient documentation

## 2015-11-25 DIAGNOSIS — I272 Other secondary pulmonary hypertension: Secondary | ICD-10-CM | POA: Diagnosis present

## 2015-11-25 DIAGNOSIS — I5022 Chronic systolic (congestive) heart failure: Secondary | ICD-10-CM | POA: Diagnosis present

## 2015-11-25 DIAGNOSIS — M7989 Other specified soft tissue disorders: Secondary | ICD-10-CM | POA: Diagnosis present

## 2015-11-25 DIAGNOSIS — Z7982 Long term (current) use of aspirin: Secondary | ICD-10-CM | POA: Diagnosis not present

## 2015-11-25 DIAGNOSIS — I071 Rheumatic tricuspid insufficiency: Secondary | ICD-10-CM | POA: Diagnosis present

## 2015-11-25 DIAGNOSIS — F410 Panic disorder [episodic paroxysmal anxiety] without agoraphobia: Secondary | ICD-10-CM | POA: Diagnosis not present

## 2015-11-25 DIAGNOSIS — I509 Heart failure, unspecified: Secondary | ICD-10-CM

## 2015-11-25 DIAGNOSIS — Z7901 Long term (current) use of anticoagulants: Secondary | ICD-10-CM | POA: Diagnosis not present

## 2015-11-25 DIAGNOSIS — I482 Chronic atrial fibrillation: Secondary | ICD-10-CM | POA: Diagnosis present

## 2015-11-25 DIAGNOSIS — L03116 Cellulitis of left lower limb: Principal | ICD-10-CM | POA: Diagnosis present

## 2015-11-25 DIAGNOSIS — I10 Essential (primary) hypertension: Secondary | ICD-10-CM | POA: Diagnosis present

## 2015-11-25 HISTORY — DX: Heart failure, unspecified: I50.9

## 2015-11-25 HISTORY — DX: Pulmonary hypertension, unspecified: I27.20

## 2015-11-25 HISTORY — DX: Hyperlipidemia, unspecified: E78.5

## 2015-11-25 HISTORY — DX: Essential (primary) hypertension: I10

## 2015-11-25 HISTORY — DX: Nonrheumatic mitral (valve) insufficiency: I34.0

## 2015-11-25 HISTORY — DX: Unspecified atrial fibrillation: I48.91

## 2015-11-25 HISTORY — DX: Panic disorder (episodic paroxysmal anxiety): F41.0

## 2015-11-25 HISTORY — DX: Rheumatic tricuspid insufficiency: I07.1

## 2015-11-25 LAB — PROTIME-INR
INR: 1.42 (ref 0.00–1.49)
PROTHROMBIN TIME: 17.5 s — AB (ref 11.6–15.2)

## 2015-11-25 LAB — COMPREHENSIVE METABOLIC PANEL
ALK PHOS: 76 U/L (ref 38–126)
ALT: 36 U/L (ref 14–54)
ANION GAP: 7 (ref 5–15)
AST: 24 U/L (ref 15–41)
Albumin: 3.4 g/dL — ABNORMAL LOW (ref 3.5–5.0)
BILIRUBIN TOTAL: 0.6 mg/dL (ref 0.3–1.2)
BUN: 18 mg/dL (ref 6–20)
CALCIUM: 8.8 mg/dL — AB (ref 8.9–10.3)
CO2: 31 mmol/L (ref 22–32)
CREATININE: 0.71 mg/dL (ref 0.44–1.00)
Chloride: 101 mmol/L (ref 101–111)
GFR calc Af Amer: >60 mL/min (ref >60)
GFR calc non Af Amer: >60 mL/min (ref >60)
GLUCOSE: 126 mg/dL — AB (ref 65–99)
Potassium: 3 mmol/L — ABNORMAL LOW (ref 3.5–5.1)
Sodium: 139 mmol/L (ref 135–145)
TOTAL PROTEIN: 6.2 g/dL — AB (ref 6.5–8.1)

## 2015-11-25 LAB — CBC WITH DIFFERENTIAL/PLATELET
Basophils Absolute: 0 10*3/uL (ref 0.0–0.1)
Basophils Relative: 0 %
EOS PCT: 0 %
Eosinophils Absolute: 0 10*3/uL (ref 0.0–0.7)
HEMATOCRIT: 35.1 % — AB (ref 36.0–46.0)
Hemoglobin: 11.1 g/dL — ABNORMAL LOW (ref 12.0–15.0)
LYMPHS ABS: 0.6 10*3/uL — AB (ref 0.7–4.0)
LYMPHS PCT: 6 %
MCH: 31 pg (ref 26.0–34.0)
MCHC: 31.6 g/dL (ref 30.0–36.0)
MCV: 98 fL (ref 78.0–100.0)
MONO ABS: 0.6 10*3/uL (ref 0.1–1.0)
MONOS PCT: 5 %
Neutro Abs: 9.3 10*3/uL — ABNORMAL HIGH (ref 1.7–7.7)
Neutrophils Relative %: 89 %
PLATELETS: 138 10*3/uL — AB (ref 150–400)
RBC: 3.58 MIL/uL — ABNORMAL LOW (ref 3.87–5.11)
RDW: 14.2 % (ref 11.5–15.5)
WBC: 10.5 10*3/uL (ref 4.0–10.5)

## 2015-11-25 LAB — LACTIC ACID, PLASMA: LACTIC ACID, VENOUS: 0.9 mmol/L (ref 0.5–2.0)

## 2015-11-25 LAB — BRAIN NATRIURETIC PEPTIDE: B NATRIURETIC PEPTIDE 5: 854 pg/mL — AB (ref 0.0–100.0)

## 2015-11-25 LAB — PROCALCITONIN: Procalcitonin: 0.18 ng/mL

## 2015-11-25 LAB — APTT: aPTT: 42 seconds — ABNORMAL HIGH (ref 24–37)

## 2015-11-25 MED ORDER — COQ10 100 MG PO CAPS: 100.0000 mg | ORAL_CAPSULE | Freq: Two times a day (BID) | ORAL | Status: DC

## 2015-11-25 MED ORDER — FUROSEMIDE 40 MG PO TABS
40.0000 mg | ORAL_TABLET | Freq: Every day | ORAL | Status: DC
  Administered 2015-11-26 – 2015-11-30 (×5): 40 mg via ORAL
  Filled 2015-11-25 (×5): qty 1

## 2015-11-25 MED ORDER — VANCOMYCIN HCL IN DEXTROSE 1-5 GM/200ML-% IV SOLN
1000.0000 mg | Freq: Two times a day (BID) | INTRAVENOUS | Status: DC
  Administered 2015-11-25 – 2015-11-28 (×6): 1000 mg via INTRAVENOUS
  Filled 2015-11-25 (×8): qty 200

## 2015-11-25 MED ORDER — VITAMIN D 1000 UNITS PO TABS
1000.0000 [IU] | ORAL_TABLET | Freq: Every day | ORAL | Status: DC
  Administered 2015-11-26 – 2015-11-29 (×4): 1000 [IU] via ORAL
  Filled 2015-11-25 (×4): qty 1

## 2015-11-25 MED ORDER — OMEGA-3-ACID ETHYL ESTERS 1 G PO CAPS
1.0000 g | ORAL_CAPSULE | Freq: Every day | ORAL | Status: DC
  Administered 2015-11-26 – 2015-11-29 (×4): 1 g via ORAL
  Filled 2015-11-25 (×5): qty 1

## 2015-11-25 MED ORDER — SPIRONOLACTONE 25 MG PO TABS
12.5000 mg | ORAL_TABLET | Freq: Every day | ORAL | Status: DC
  Administered 2015-11-26 – 2015-11-30 (×5): 12.5 mg via ORAL
  Filled 2015-11-25 (×5): qty 1

## 2015-11-25 MED ORDER — ACETAMINOPHEN 650 MG RE SUPP: 650.0000 mg | Freq: Four times a day (QID) | RECTAL | Status: DC | PRN

## 2015-11-25 MED ORDER — AMIODARONE HCL 200 MG PO TABS
200.0000 mg | ORAL_TABLET | Freq: Two times a day (BID) | ORAL | Status: DC
  Administered 2015-11-25 – 2015-11-30 (×10): 200 mg via ORAL
  Filled 2015-11-25 (×10): qty 1

## 2015-11-25 MED ORDER — CLINDAMYCIN PHOSPHATE 900 MG/50ML IV SOLN
900.0000 mg | Freq: Once | INTRAVENOUS | Status: AC
  Administered 2015-11-25: 900 mg via INTRAVENOUS
  Filled 2015-11-25: qty 50

## 2015-11-25 MED ORDER — ONDANSETRON HCL 4 MG/2ML IJ SOLN: 4.0000 mg | Freq: Four times a day (QID) | INTRAMUSCULAR | Status: DC | PRN

## 2015-11-25 MED ORDER — SODIUM CHLORIDE 0.9 % IJ SOLN
3.0000 mL | Freq: Two times a day (BID) | INTRAMUSCULAR | Status: DC
  Administered 2015-11-25 – 2015-11-29 (×7): 3 mL via INTRAVENOUS

## 2015-11-25 MED ORDER — MORPHINE SULFATE (PF) 2 MG/ML IV SOLN
1.0000 mg | INTRAVENOUS | Status: DC | PRN
  Administered 2015-11-26 – 2015-11-29 (×11): 1 mg via INTRAVENOUS
  Filled 2015-11-25 (×11): qty 1

## 2015-11-25 MED ORDER — DEXTROSE 5 % IV SOLN
2.0000 g | INTRAVENOUS | Status: DC
  Filled 2015-11-25: qty 2

## 2015-11-25 MED ORDER — BENZONATATE 100 MG PO CAPS: 200.0000 mg | ORAL_CAPSULE | ORAL | Status: DC | PRN

## 2015-11-25 MED ORDER — POTASSIUM CHLORIDE CRYS ER 20 MEQ PO TBCR
40.0000 meq | EXTENDED_RELEASE_TABLET | Freq: Once | ORAL | Status: AC
  Administered 2015-11-25: 40 meq via ORAL
  Filled 2015-11-25: qty 2

## 2015-11-25 MED ORDER — APIXABAN 5 MG PO TABS: 5.0000 mg | ORAL_TABLET | Freq: Two times a day (BID) | ORAL | Status: DC

## 2015-11-25 MED ORDER — SODIUM CHLORIDE 0.9 % IV BOLUS (SEPSIS)
1000.0000 mL | Freq: Once | INTRAVENOUS | Status: AC
  Administered 2015-11-25: 1000 mL via INTRAVENOUS

## 2015-11-25 MED ORDER — SACUBITRIL-VALSARTAN 24-26 MG PO TABS
1.0000 | ORAL_TABLET | Freq: Two times a day (BID) | ORAL | Status: DC
  Administered 2015-11-25 – 2015-11-30 (×10): 1 via ORAL
  Filled 2015-11-25 (×14): qty 1

## 2015-11-25 MED ORDER — ONDANSETRON HCL 4 MG PO TABS: 4.0000 mg | ORAL_TABLET | Freq: Four times a day (QID) | ORAL | Status: DC | PRN

## 2015-11-25 MED ORDER — FISH OIL OIL: 1000.0000 mg | TOPICAL_OIL | Freq: Every day | Status: DC

## 2015-11-25 MED ORDER — RED YEAST RICE 600 MG PO TABS: 1.0000 | ORAL_TABLET | Freq: Every day | ORAL | Status: DC

## 2015-11-25 MED ORDER — ASPIRIN EC 81 MG PO TBEC
81.0000 mg | DELAYED_RELEASE_TABLET | Freq: Every day | ORAL | Status: DC
Start: 2015-11-26 — End: 2015-11-30
  Administered 2015-11-26 – 2015-11-29 (×4): 81 mg via ORAL
  Filled 2015-11-25 (×4): qty 1

## 2015-11-25 MED ORDER — DULOXETINE HCL 30 MG PO CPEP
30.0000 mg | ORAL_CAPSULE | Freq: Every day | ORAL | Status: DC
  Administered 2015-11-26 – 2015-11-30 (×5): 30 mg via ORAL
  Filled 2015-11-25 (×5): qty 1

## 2015-11-25 MED ORDER — OXYCODONE-ACETAMINOPHEN 5-325 MG PO TABS: 1.0000 | ORAL_TABLET | Freq: Once | ORAL | Status: DC

## 2015-11-25 MED ORDER — APIXABAN 5 MG PO TABS
5.0000 mg | ORAL_TABLET | Freq: Two times a day (BID) | ORAL | Status: DC
  Administered 2015-11-25 – 2015-11-30 (×10): 5 mg via ORAL
  Filled 2015-11-25 (×10): qty 1

## 2015-11-25 MED ORDER — ACETAMINOPHEN 325 MG PO TABS
650.0000 mg | ORAL_TABLET | Freq: Four times a day (QID) | ORAL | Status: DC | PRN
  Administered 2015-11-25: 650 mg via ORAL
  Filled 2015-11-25: qty 2

## 2015-11-25 MED ORDER — ALPHA-TOCOPHEROL LIQD: 1.0000 | Freq: Every day | Status: DC

## 2015-11-25 MED ORDER — METOPROLOL TARTRATE 25 MG PO TABS
25.0000 mg | ORAL_TABLET | Freq: Two times a day (BID) | ORAL | Status: DC
  Administered 2015-11-25 – 2015-11-30 (×10): 25 mg via ORAL
  Filled 2015-11-25 (×10): qty 1

## 2015-11-25 MED ORDER — ADULT MULTIVITAMIN W/MINERALS CH
1.0000 | ORAL_TABLET | Freq: Every day | ORAL | Status: DC
Start: 2015-11-26 — End: 2015-11-30
  Administered 2015-11-26 – 2015-11-29 (×4): 1 via ORAL
  Filled 2015-11-25 (×6): qty 1

## 2015-11-25 MED ORDER — CALCIUM-VITAMIN D-VITAMIN K 500-1000-40 MG-UNT-MCG PO CHEW: 1.0000 | CHEWABLE_TABLET | Freq: Two times a day (BID) | ORAL | Status: DC

## 2015-11-25 MED ORDER — PIPERACILLIN-TAZOBACTAM 3.375 G IVPB
3.3750 g | Freq: Three times a day (TID) | INTRAVENOUS | Status: DC
  Administered 2015-11-26 – 2015-11-29 (×13): 3.375 g via INTRAVENOUS
  Filled 2015-11-25 (×18): qty 50

## 2015-11-25 MED ORDER — LECITHIN 400 MG PO CAPS: 1.0000 | ORAL_CAPSULE | Freq: Two times a day (BID) | ORAL | Status: DC

## 2015-11-25 MED ORDER — LACTINEX PO CHEW
1.0000 | CHEWABLE_TABLET | Freq: Every day | ORAL | Status: DC
  Administered 2015-11-26 – 2015-11-29 (×4): 1 via ORAL
  Filled 2015-11-25 (×9): qty 1

## 2015-11-25 NOTE — Progress Notes (Signed)
Patient arrived on unit via stretcher with [facility]. Patient alert and oriented x4. Patient oriented to room, staff and unit. Patient placed on telemetry monitor, CCMD notified. Skin assessment completed, check flowsheets. Patient's IV clean, dry and intact. Pain denies pain.  Safety Fall Prevention Plan was given, discussed and signed by patient.  Orders have been reviewed and implemented. Call light has been placed within reach. RN will continue to monitor the patient.   Rivka Barbara BSN, RN  Phone Number: [phone]

## 2015-11-25 NOTE — Progress Notes (Addendum)
ANTICOAGULATION CONSULT NOTE - Initial Consult  Pharmacy Consult for apixaban Indication: atrial fibrillation  No Known Allergies  Patient Measurements: Height: 5\' 4"  (162.6 cm) Weight: 213 lb 6.4 oz (96.798 kg) IBW/kg (Calculated) : 54.7  Vital Signs: Temp: 98.4 F (36.9 C) (01/23 1935) Temp Source: Oral (01/23 1935) BP: 126/55 mmHg (01/23 1935) Pulse Rate: 77 (01/23 1935)  Labs:  Recent Labs  11/25/15 1345  HGB 11.1*  HCT 35.1*  PLT 138*  CREATININE 0.71    Estimated Creatinine Clearance: 73.9 mL/min (by C-G formula based on Cr of 0.71).   Medical History: Past Medical History  Diagnosis Date  . Panic attack   . A-fib (HCC)   . CHF (congestive heart failure) (HCC)    Assessment: 69 yof presented to the hospital with leg swelling. To continue her home regimen of apixaban. Patients daughter stated she only took her AM dose of 5mg  today. H/H + platelets are slightly low. Scr is WNL. No bleeding noted.   Goal of Therapy:  Therapeutic anticoagulation and stroke prevention Monitor platelets by anticoagulation protocol: Yes   Plan:  - Apixaban 5mg  PO BID - F/u renal fxn, S&S of bleeding  *Pharmacy will sign off and only follow peripherally as no dose adjustments are anticipated. Thank you for the consult!  Gracee Ratterree, Drake Leach 11/25/2015,8:55 PM  Addendum: Also starting vancomycin and zosyn for cellulitis. Pt is afebrile and WBC is WNL. Scr is WNL and cultures are pending.   Plan: - Zosyn 3.375gm IV Q8H (4 hr inf) - Vanc 1gm IV Q12H - F/u renal fxn, C&S, clinical status and trough at [facility], PharmD, BCPS Pager # [phone] 11/25/2015 10:17 PM

## 2015-11-25 NOTE — ED Notes (Addendum)
Attempted to call report to receiving nurse on 6E but she is unavailable at this time.  She is to call me back.  [facility] left with pt before she could get the Percocet.

## 2015-11-25 NOTE — ED Notes (Signed)
Left LE swelling, "blue since August"-now red with "weeping" since yesterday-pt has been on doxy and lasix with no relief-has been seen by PCP, cards and minute [facility]

## 2015-11-25 NOTE — H&P (Signed)
[facility] History and Physical  Kelly Dickson ZOX:[id number] DOB: 1946-10-07 DOA: 11/25/2015  Referring physician: ED physician PCP: Karma Lew, FNP  Specialists:   Chief Complaint: left leg pain and swelling  HPI: Kelly Dickson is a 70 y.o. female with PMH of Systolic congestive heart failure (EF 25-30 percent per patient's daughter, I could not find 2-D echo record in Epic), hypertension, depression, anxiety, panic disorder, atrial fibrillation on Eliquis, pulmonary hypertension, mitral regurgitation, tricuspid regurgitation, who presents with left leg pain and swelling.  Patient reports that she has been having intermittent left leg swelling and blue color change since last August. She recently had dog scratch and puncture wound to the left lateral aspect of her leg. Since then has had some increased swelling. Has seen her PCP multiple times for this. She completed 10-day course of doxycycline without relief. Has tried Lasix as well. She reports that her left leg become red and more painful, which has been progressively getting worse. She had fever with temperature 100 at one time point. Patient does not have nausea, vomiting, chest pain, shortness of breath, cough, abdominal pain, diarrhea, dysuria. She has increased urinary frequency, which she attributed to Lasix use.   In ED, patient was found to have WBC 10.5, temperature normal, no tachycardia, potassium 3.0, renal function normal. Left leg venous Doppler is negative for DVT, but showed nonocclusive thrombus within the left greater saphenous vein. Patient is admitted to inpatient for further eval and treatment.  EKG: Not done in ED, will get one.   Where does patient live?   At home    Can patient participate in ADLs?   Little   Review of Systems:   General: had fevers, chills, no changes in body weight, has fatigue HEENT: no blurry vision, hearing changes or sore throat Pulm: no dyspnea, coughing,  wheezing CV: no chest pain, palpitations Abd: no nausea, vomiting, abdominal pain, diarrhea, constipation GU: no dysuria, burning on urination, increased urinary frequency, hematuria  Ext: has leg edema Neuro: no unilateral weakness, numbness, or tingling, no vision change or hearing loss Skin: no rash MSK: No muscle spasm, no deformity, no limitation of range of movement in spin Heme: No easy bruising.  Travel history: No recent long distant travel.  Allergy: No Known Allergies  Past Medical History  Diagnosis Date  . Panic attack   . A-fib (HCC)   . CHF (congestive heart failure) (HCC)   . Essential hypertension   . MR (mitral regurgitation)   . TR (tricuspid regurgitation)   . Pulmonary hypertension (HCC)   . HLD (hyperlipidemia)     Past Surgical History  Procedure Laterality Date  . Replacement total knee      Social History:  reports that she has never smoked. She does not have any smokeless tobacco history on file. She reports that she does not drink alcohol or use illicit drugs.  Family History:  Family History  Problem Relation Age of Onset  . Breast cancer Mother   . Hypertension Sister      Prior to Admission medications   Medication Sig Start Date End Date Taking? Authorizing Provider  AMIODARONE HCL PO Take by mouth.   Yes Historical Provider, MD  Apixaban (ELIQUIS PO) Take by mouth.   Yes Historical Provider, MD  aspirin 81 MG tablet Take 81 mg by mouth daily.   Yes Historical Provider, MD  Benzonatate (TESSALON PO) Take by mouth.   Yes Historical Provider, MD  Bioflavonoid Products (ESTER C  PO) Take by mouth.   Yes Historical Provider, MD  Calcium Citrate-Vitamin D (CALCIUM + D PO) Take by mouth.   Yes Historical Provider, MD  COENZYME Q-10 PO Take by mouth.   Yes Historical Provider, MD  DULoxetine HCl (CYMBALTA PO) Take by mouth.   Yes Historical Provider, MD  Fish Oil OIL by Does not apply route.   Yes Historical Provider, MD  Furosemide (LASIX PO)  Take by mouth.   Yes Historical Provider, MD  METOPROLOL TARTRATE PO Take by mouth.   Yes Historical Provider, MD  Multiple Vitamin (MULTIVITAMIN) tablet Take 1 tablet by mouth daily.   Yes Historical Provider, MD  Red Yeast Rice Extract (RED YEAST RICE PO) Take by mouth.   Yes Historical Provider, MD  Sacubitril-Valsartan (ENTRESTO PO) Take by mouth.   Yes Historical Provider, MD  Vitamin E (ALPHA-TOCOPHEROL) LIQD by Does not apply route.   Yes Historical Provider, MD  VITAMIN E EX Apply topically.   Yes Historical Provider, MD    Physical Exam: Filed Vitals:   11/25/15 1700 11/25/15 1748 11/25/15 1800 11/25/15 1935  BP: 126/68 106/62 120/52 126/55  Pulse: 63 68 88 77  Temp:  99 F (37.2 C)  98.4 F (36.9 C)  TempSrc:  Oral  Oral  Resp:  18  18  Height:    5\' 4"  (1.626 m)  Weight:    96.798 kg (213 lb 6.4 oz)  SpO2: 98% 99% 96% 97%   General: Not in acute distress HEENT:       Eyes: PERRL, EOMI, no scleral icterus.       ENT: No discharge from the ears and nose, no pharynx injection, no tonsillar enlargement.        Neck: No JVD, no bruit, no mass felt. Heme: No neck lymph node enlargement. Cardiac: S1/S2, RRR, No murmurs, No gallops or rubs. Pulm:  No rales, wheezing, rhonchi or rubs. Abd: Soft, nondistended, nontender, no rebound pain, no organomegaly, BS present. Has a reducible and nontender ventral hernia Ext: has leg edema bilaterally, 3+ on left leg and 2+ on the right leg. The left lower leg is warm and tender.  2+DP/PT pulse bilaterally. Musculoskeletal: No joint deformities, No joint redness or warmth, no limitation of ROM in spin. Skin: No rashes.  Neuro: Alert, oriented X3, cranial nerves II-XII grossly intact, moves all extremities. Psych: Patient is not psychotic, no suicidal or hemocidal ideation.  Labs on Admission:  Basic Metabolic Panel:  Recent Labs Lab 11/25/15 1345 11/26/15 0145  NA 139 142  K 3.0* 4.1  CL 101 102  CO2 31 30  GLUCOSE 126* 131*   BUN 18 14  CREATININE 0.71 0.83  CALCIUM 8.8* 8.8*  MG  --  2.0   Liver Function Tests:  Recent Labs Lab 11/25/15 1345 11/26/15 0145  AST 24 19  ALT 36 29  ALKPHOS 76 75  BILITOT 0.6 1.0  PROT 6.2* 5.4*  ALBUMIN 3.4* 2.7*   No results for input(s): LIPASE, AMYLASE in the last 168 hours. No results for input(s): AMMONIA in the last 168 hours. CBC:  Recent Labs Lab 11/25/15 1345 11/26/15 0145  WBC 10.5 8.0  NEUTROABS 9.3*  --   HGB 11.1* 10.1*  HCT 35.1* 32.0*  MCV 98.0 97.6  PLT 138* 128*   Cardiac Enzymes: No results for input(s): CKTOTAL, CKMB, CKMBINDEX, TROPONINI in the last 168 hours.  BNP (last 3 results)  Recent Labs  11/25/15 2234  BNP 854.0*    ProBNP (  last 3 results) No results for input(s): PROBNP in the last 8760 hours.  CBG: No results for input(s): GLUCAP in the last 168 hours.  Radiological Exams on Admission: US Venous Img Lower Unilateral Left  11/25/2015  CLINICAL DATA:  History of lower leg swelling, redness, tenderness for 2 weeks. Status post abrasion on the calf. Pain, edema, injury, color changes. History of atrial fibrillation and CHF. EXAM: LEFT LOWER EXTREMITY VENOUS DOPPLER ULTRASOUND TECHNIQUE: Gray-scale sonography with graded compression, as well as color Doppler and duplex ultrasound were performed to evaluate the lower extremity deep venous systems from the level of the common femoral vein and including the common femoral, femoral, profunda femoral, popliteal and calf veins including the posterior tibial, peroneal and gastrocnemius veins when visible. The superficial great saphenous vein was also interrogated. Spectral Doppler was utilized to evaluate flow at rest and with distal augmentation maneuvers in the common femoral, femoral and popliteal veins. COMPARISON:  None. FINDINGS: Contralateral Common Femoral Vein: Respiratory phasicity is normal and symmetric with the symptomatic side. No evidence of thrombus. Normal  compressibility. Common Femoral Vein: No evidence of thrombus. Normal compressibility, respiratory phasicity and response to augmentation. Saphenofemoral Junction: No evidence of thrombus. Normal compressibility and flow on color Doppler imaging. Profunda Femoral Vein: No evidence of thrombus. Normal compressibility and flow on color Doppler imaging. Femoral Vein: No evidence of thrombus. Normal compressibility, respiratory phasicity and response to augmentation. Popliteal Vein: No evidence of thrombus. Normal compressibility, respiratory phasicity and response to augmentation. Calf Veins: Limited visualization Superficial Great Saphenous Vein: Create left greater saphenous vein from the upper thigh to the knee is noncompressible. Only partial flow is seen in the upper thigh of the greater saphenous vein. Echogenic material is identified within the greater saphenous vein in the upper thigh, consistent with nonocclusive thrombus. Venous Reflux:  None. Other Findings:  Subcutaneous edema identified in the lower leg. IMPRESSION: 1. No evidence for occlusive deep vein thrombosis in the left lower extremity. 2. Nonocclusive thrombus identified within the left greater saphenous vein. 3. Lower extremity edema. Electronically Signed   By: Norva Pavlov M.D.   On: 11/25/2015 14:59    Assessment/Plan Principal Problem:   Cellulitis of leg, left Active Problems:   Cellulitis   Panic attack   A-fib (HCC)   CHF (congestive heart failure) (HCC)   Leg swelling   Essential hypertension   MR (mitral regurgitation)   TR (tricuspid regurgitation)   Pulmonary hypertension (HCC)   Cellulitis of leg, left: Patient's left leg tenderness, warmth and swelling are consistency with cellulitis. Given history of dog scratch, will start Abx with anaerobe coverage. Patient is not septic on admission, hemodynamically stable.  - will admit to tele bed - Empiric antimicrobial treatment with vancomycin and Zosyn per pharmacy  (EDP gave dose of clindamycin). - PRN Zofran for nausea, morphine and Percocet for pain - Blood cultures x 2  - will get Procalcitonin and trend lactic acid levels per sepsis protocol. - IVF: 1.0 L of NS bolus was given by EDP, will not continue IVF now due to Low EF.  Atrial Fibrillation: CHA2DS2-VASc Score is 5, needs oral anticoagulation. Patient is on Eliquis at home. INR is pending on admission. Heart rate is well controlled. -continue metoprolol, amiodarone and eliquis  Systolic CHF (congestive heart failure) (HCC): Per patient's daughter, she had 2-D echo on 05/2015 with EF 25-30 percent. Patient is on Lasix at home. Patient has fluid overload, but respiratory functions okay. Due to ongoing cellulitis and risk of  developing sepsis, I will not escalate diuretics at this moment. -Continue home dose Lasix 40 mg daily and spironolactone -Aspirin, metoprolol,and Entresto -Check BNP  Essential hypertension: -On metoprolol and Lasix -Entresto  Hypokalemia: K= 3.0 on admission. - Repleted - Check Mg level   DVT ppx: on Eliquis  Code Status: Full code Family Communication:  Yes, patient's daughter  at bed side Disposition Plan: Admit to inpatient   Date of Service 11/26/2015    Lorretta Harp [facility] Pager [phone]  If 7PM-7AM, please contact night-coverage [email or web address] Password [email or web address] 11/26/2015, 5:06 AM

## 2015-11-25 NOTE — ED Provider Notes (Signed)
CSN: [id number]     Arrival date & time 11/25/15  1246 History   First MD Initiated Contact with Patient 11/25/15 1307     Chief Complaint  Patient presents with  . Leg Swelling     (Consider location/radiation/quality/duration/timing/severity/associated sxs/prior Treatment) Patient is a 70 y.o. female presenting with general illness. The history is provided by the patient and a relative.  Illness Severity:  Severe Onset quality:  Gradual Duration:  1 week Timing:  Constant Progression:  Worsening Chronicity:  Recurrent Associated symptoms: rash   Associated symptoms: no chest pain, no congestion, no fever, no headaches, no myalgias, no nausea, no rhinorrhea, no shortness of breath, no vomiting and no wheezing     70 yo F with a chief complaint of left lower externally swelling. This is been an off and on problem for this patient. She recently had a wound from a dog scratch. Puncture the left lateral aspect of her leg. Since then has had some increased swelling. Has seen her PCP multiple times for this. Did attend a course of doxycycline without relief. Has tried Lasix as well. Patient returns today feeling systemically ill. Denies any fevers but has had some weakness. Patient with some nausea but denies vomiting.  Past Medical History  Diagnosis Date  . Panic attack   . A-fib (HCC)   . CHF (congestive heart failure) [facility])    Past Surgical History  Procedure Laterality Date  . Replacement total knee     No family history on file. Social History  Substance Use Topics  . Smoking status: Never Smoker   . Smokeless tobacco: None  . Alcohol Use: No   OB History    No data available     Review of Systems  Constitutional: Negative for fever and chills.  HENT: Negative for congestion and rhinorrhea.   Eyes: Negative for redness and visual disturbance.  Respiratory: Negative for shortness of breath and wheezing.   Cardiovascular: Positive for leg swelling. Negative for chest  pain and palpitations.  Gastrointestinal: Negative for nausea and vomiting.  Genitourinary: Negative for dysuria and urgency.  Musculoskeletal: Negative for myalgias and arthralgias.  Skin: Positive for color change and rash. Negative for pallor and wound.  Neurological: Negative for dizziness and headaches.      Allergies  Review of patient's allergies indicates no known allergies.  Home Medications   Prior to Admission medications   Medication Sig Start Date End Date Taking? Authorizing Provider  AMIODARONE HCL PO Take by mouth.   Yes Historical Provider, MD  Apixaban (ELIQUIS PO) Take by mouth.   Yes Historical Provider, MD  aspirin 81 MG tablet Take 81 mg by mouth daily.   Yes Historical Provider, MD  Benzonatate (TESSALON PO) Take by mouth.   Yes Historical Provider, MD  Bioflavonoid Products (ESTER C PO) Take by mouth.   Yes Historical Provider, MD  Calcium Citrate-Vitamin D (CALCIUM + D PO) Take by mouth.   Yes Historical Provider, MD  COENZYME Q-10 PO Take by mouth.   Yes Historical Provider, MD  DULoxetine HCl (CYMBALTA PO) Take by mouth.   Yes Historical Provider, MD  Fish Oil OIL by Does not apply route.   Yes Historical Provider, MD  Furosemide (LASIX PO) Take by mouth.   Yes Historical Provider, MD  METOPROLOL TARTRATE PO Take by mouth.   Yes Historical Provider, MD  Multiple Vitamin (MULTIVITAMIN) tablet Take 1 tablet by mouth daily.   Yes Historical Provider, MD  Red Yeast Rice Extract (  RED YEAST RICE PO) Take by mouth.   Yes Historical Provider, MD  Sacubitril-Valsartan (ENTRESTO PO) Take by mouth.   Yes Historical Provider, MD  Vitamin E (ALPHA-TOCOPHEROL) LIQD by Does not apply route.   Yes Historical Provider, MD  VITAMIN E EX Apply topically.   Yes Historical Provider, MD   BP 116/64 mmHg  Pulse 84  Temp(Src) 98.4 F (36.9 C) (Oral)  Resp 18  Ht 5\' 4"  (1.626 m)  Wt 195 lb (88.451 kg)  BMI 33.46 kg/m2  SpO2 97% Physical Exam  Constitutional: She is  oriented to person, place, and time. She appears well-developed and well-nourished. No distress.  HENT:  Head: Normocephalic and atraumatic.  Eyes: EOM are normal. Pupils are equal, round, and reactive to light.  Neck: Normal range of motion. Neck supple.  Cardiovascular: Normal rate and regular rhythm.  Exam reveals no gallop and no friction rub.   No murmur heard. Pulmonary/Chest: Effort normal. She has no wheezes. She has no rales.  Abdominal: Soft. She exhibits no distension. There is no tenderness. There is no rebound.  Musculoskeletal: She exhibits edema (4+ edema to the left lower extremity compared to 2+ on the right. Erythema spreading from the ankle up into the  tibial tuberosity). She exhibits no tenderness.  Pulse motor and sensation intact to the foot.  Neurological: She is alert and oriented to person, place, and time.  Skin: Skin is warm and dry. She is not diaphoretic.  Psychiatric: She has a normal mood and affect. Her behavior is normal.  Nursing note and vitals reviewed.   ED Course  Procedures (including critical care time) Labs Review Labs Reviewed  CBC WITH DIFFERENTIAL/PLATELET - Abnormal; Notable for the following:    RBC 3.58 (*)    Hemoglobin 11.1 (*)    HCT 35.1 (*)    Platelets 138 (*)    Neutro Abs 9.3 (*)    Lymphs Abs 0.6 (*)    All other components within normal limits  COMPREHENSIVE METABOLIC PANEL - Abnormal; Notable for the following:    Potassium 3.0 (*)    Glucose, Bld 126 (*)    Calcium 8.8 (*)    Total Protein 6.2 (*)    Albumin 3.4 (*)    All other components within normal limits  CULTURE, BLOOD (ROUTINE X 2)  CULTURE, BLOOD (ROUTINE X 2)    Imaging Review No results found. I have personally reviewed and evaluated these images and lab results as part of my medical decision-making.   EKG Interpretation None      Emergency Focused Ultrasound Exam Limited Ultrasound of Soft Tissue   Performed and interpreted by Dr.  Adela Lank Indication: evaluation for infection or foreign body Transverse and Sagittal views of the LLE are obtained in real time for the purposes of evaluation of skin and underlying soft tissues.  Findings: no heterogeneous fluid collection, with hyperemia/edema of surrounding tissue Interpretation: no abscess, with cellulitis Images archived electronically.  CPT Codes:   Lower extremity [id number]      MDM   Final diagnoses:  Cellulitis of left lower extremity    70 yo F with a chief complaint of left lower extremity swelling. This is most likely secondary to cellulitis. Patient having significant edema will obtain a DVT study. Due to extent of edema and erythema as well as systemic symptoms feel the patient needs admission with IV antibiotics. DVT study negative. Will admit. Transfer to [facility].   The patients results and plan were reviewed and discussed.  Any x-rays performed were independently reviewed by myself.   Differential diagnosis were considered with the presenting HPI.  Medications  potassium chloride SA (K-DUR,KLOR-CON) CR tablet 40 mEq (not administered)  sodium chloride 0.9 % bolus 1,000 mL (1,000 mLs Intravenous New Bag/Given 11/25/15 1401)  clindamycin (CLEOCIN) IVPB 900 mg (0 mg Intravenous Stopped 11/25/15 1519)    Filed Vitals:   11/25/15 1300 11/25/15 1437 11/25/15 1500  BP: 116/64 127/80 105/66  Pulse: 84 69 67  Temp: 98.4 F (36.9 C)    TempSrc: Oral    Resp: 18 16 16   Height: 5\' 4"  (1.626 m)    Weight: 195 lb (88.451 kg)    SpO2: 97% 99% 99%    Final diagnoses:  Cellulitis of left lower extremity    Admission/ observation were discussed with the admitting physician, patient and/or family and they are comfortable with the plan.    Melene Plan, DO 11/25/15 1536

## 2015-11-26 DIAGNOSIS — I10 Essential (primary) hypertension: Secondary | ICD-10-CM

## 2015-11-26 DIAGNOSIS — E876 Hypokalemia: Secondary | ICD-10-CM

## 2015-11-26 LAB — COMPREHENSIVE METABOLIC PANEL
ALBUMIN: 2.7 g/dL — AB (ref 3.5–5.0)
ALK PHOS: 75 U/L (ref 38–126)
ALT: 29 U/L (ref 14–54)
AST: 19 U/L (ref 15–41)
Anion gap: 10 (ref 5–15)
BILIRUBIN TOTAL: 1 mg/dL (ref 0.3–1.2)
BUN: 14 mg/dL (ref 6–20)
CALCIUM: 8.8 mg/dL — AB (ref 8.9–10.3)
CO2: 30 mmol/L (ref 22–32)
CREATININE: 0.83 mg/dL (ref 0.44–1.00)
Chloride: 102 mmol/L (ref 101–111)
GFR calc Af Amer: >60 mL/min (ref >60)
GLUCOSE: 131 mg/dL — AB (ref 65–99)
POTASSIUM: 4.1 mmol/L (ref 3.5–5.1)
Sodium: 142 mmol/L (ref 135–145)
TOTAL PROTEIN: 5.4 g/dL — AB (ref 6.5–8.1)

## 2015-11-26 LAB — CBC
HCT: 32 % — ABNORMAL LOW (ref 36.0–46.0)
Hemoglobin: 10.1 g/dL — ABNORMAL LOW (ref 12.0–15.0)
MCH: 30.8 pg (ref 26.0–34.0)
MCHC: 31.6 g/dL (ref 30.0–36.0)
MCV: 97.6 fL (ref 78.0–100.0)
PLATELETS: 128 10*3/uL — AB (ref 150–400)
RBC: 3.28 MIL/uL — ABNORMAL LOW (ref 3.87–5.11)
RDW: 14.1 % (ref 11.5–15.5)
WBC: 8 10*3/uL (ref 4.0–10.5)

## 2015-11-26 LAB — LACTIC ACID, PLASMA: LACTIC ACID, VENOUS: 0.8 mmol/L (ref 0.5–2.0)

## 2015-11-26 LAB — MAGNESIUM: Magnesium: 2 mg/dL (ref 1.7–2.4)

## 2015-11-26 NOTE — Evaluation (Addendum)
Occupational Therapy Evaluation Patient Details Name: Kelly Dickson MRN: [id number] DOB: [birth year] Today's Date: 11/26/2015    History of Present Illness HPI: Kelly Dickson is a 69 y.o. female with PMH of Systolic congestive heart failure, hypertension, depression, knee surgery, anxiety, panic disorder, atrial fibrillation on Eliquis, pulmonary hypertension, mitral regurgitation, tricuspid regurgitation, who presents with left leg pain and swelling. Left leg venous Doppler is negative for DVT, but showed nonocclusive thrombus within the left greater saphenous vein   Clinical Impression   Pt admitted with above. Pt independent with ADLs, PTA. Feel pt will benefit from acute OT to reinforce energy conservation techniques and increase strength prior to d/c. Do not feel pt will need OT follow up upon d/c.    Follow Up Recommendations  No OT follow up    Equipment Recommendations  None recommended by OT    Recommendations for Other Services       Precautions / Restrictions Restrictions Weight Bearing Restrictions: No      Mobility Bed Mobility Overal bed mobility: Needs Assistance Bed Mobility: Sit to Supine       Sit to supine: Min assist   General bed mobility comments: assist with LE and positioned LEs towards end of session.  Transfers Overall transfer level: Needs assistance   Transfers: Sit to/from Stand Sit to Stand: Supervision              Balance      Able to pick item off of floor without LOB. Pt had difficulty with single leg stance without using UE support-decreased balance.                                      ADL Overall ADL's : Needs assistance/impaired     Grooming: Wash/dry hands;Oral care;Set up;Standing (set up for tasks performed today; may would need supervision as well for additional tasks for energy conservation).        Lower Body Bathing: Set up;Supervison/ safety;Sit to/from stand       Lower Body  Dressing: Sit to/from stand;Supervision/safety;Set up   Toilet Transfer: Supervision/safety;Ambulation;Regular Toilet   Toileting- Architect and Hygiene: Sit to/from stand Toileting - Clothing Manipulation Details (indicate cue type and reason): no physical assist needed. Pt wanting door shut while she toileted.     Functional mobility during ADLs: Supervision/safety General ADL Comments: instructed pt to elevate LLE. Educated on energy conservation techniques. Pt verbalized understanding of tub transfer. Educated on safety such as rugs/items on floor and sitting for LB ADLs.  Pt talked about her son (who recently passed away). Informed pt of cost of reacher in gift shop.     Vision     Perception     Praxis      Pertinent Vitals/Pain Pain Assessment: 0-10 Pain Score: 8  Pain Location: LLE and also Rt shoulder  Pain Intervention(s): Monitored during session;Repositioned     Hand Dominance     Extremity/Trunk Assessment Upper Extremity Assessment Upper Extremity Assessment: Generalized weakness (Rt shoulder flexors weaker than left)   Lower Extremity Assessment Lower Extremity Assessment: Defer to PT evaluation       Communication Communication Communication: No difficulties   Cognition Arousal/Alertness: Awake/alert Behavior During Therapy: WFL for tasks assessed/performed Overall Cognitive Status: Within Functional Limits for tasks assessed (talkative)                     General  Comments       Exercises       Shoulder Instructions      Home Living Family/patient expects to be discharged to:: Private residence Living Arrangements: Alone Available Help at Discharge: Family;Available PRN/intermittently Type of Home: House Home Access: Ramped entrance (on rainy days, will go through basement-flight of steps)     Home Layout: Two level;Able to live on main level with bedroom/bathroom;Laundry or work area in Government social research officer of Steps: flight Alternate Level Stairs-Rails: Right Bathroom Shower/Tub: Dietitian: Environmental consultant - 2 wheels;Bedside commode;Tub bench; long handled brush; hand held shower head          Prior Functioning/Environment Level of Independence: Independent             OT Diagnosis: Generalized weakness;Acute pain   OT Problem List: Decreased strength;Decreased activity tolerance;Impaired balance (sitting and/or standing);Decreased knowledge of use of DME or AE;Pain;Increased edema   OT Treatment/Interventions: Self-care/ADL training;Therapeutic exercise;Energy conservation;DME and/or AE instruction;Therapeutic activities;Patient/family education;Balance training    OT Goals(Current goals can be found in the care plan section) Acute Rehab OT Goals Patient Stated Goal: not stated OT Goal Formulation: With patient Time For Goal Achievement: 12/03/15 Potential to Achieve Goals: Good ADL Goals Pt Will Transfer to Toilet: with modified independence;ambulating Additional ADL Goal #1: Pt will independently verbalize 3 energy conservation techniques and utilize in session as needed. Additional ADL Goal #2: Pt will independently perform HEP for bilateral UEs to increase strength.  OT Frequency: Min 2X/week   Barriers to D/C:            Co-evaluation              End of Session    Activity Tolerance: Patient tolerated treatment well Patient left: in bed;with call bell/phone within reach   Time: 1412-1444 OT Time Calculation (min): 32 min Charges:  OT General Charges $OT Visit: 1 Procedure OT Evaluation $OT Eval Moderate Complexity: 1 Procedure G-CodesEarlie Raveling OTR/L [phone] 11/26/2015, 3:00 PM

## 2015-11-26 NOTE — Progress Notes (Signed)
Utilization review completed. Rafferty Postlewait, RN, BSN. 

## 2015-11-26 NOTE — Progress Notes (Signed)
[facility] PROGRESS NOTE  ORADELL SKOGSTAD VJD:[id number] DOB: Mar 16, 1946 DOA: 11/25/2015 PCP: Karma Lew, FNP  Assessment/Plan: 1. Left lower extremity cellulitis -She presents with complaints of worsening left lower extremity pain, erythema, swelling, localized warmth. Reports having been scratched by her dog several weeks ago. Symptoms worsened despite being treated with a ten-day course of doxycycline -Plan to continue broad-spectrum empiric IV antibiotic therapy with vancomycin and Zosyn for now. -She had a left lower extremity ultrasound which did not reveal evidence for occlusive deep venous thrombosis.  2.  History of atrial fibrillation -Having a CHADSVasc score of 5 -She is chronically anticoagulated with Eliquis which has been continued during this hospitalization -Continue metoprolol 25 mg by mouth twice a day and amiodarone 200 mg by mouth twice a day. -Telemetry shows she is rate controlled  3.  Chronic systolic congestive heart failure -Per family members patient having a history of systolic congestive heart failure having EF of 25-30%. -She appears euvolemic -Plan to continue spironolactone 12.5 mg daily and Lasix 40 mg by mouth daily  4.  Hypokalemia -Patient presents with a potassium of 3.0, improved to 4.1 after replacement  5.  Hypertension. -Blood pressures controlled, continue metoprolol 25 mg by mouth twice a day and Entresto 24/26 mg BID.   Code Status: Full Code Family Communication: I spoke with her daughter who was present at bedside Disposition Plan: Continue emperic AB coverage   Consultants:  Pharmacy  Antibiotics:  Vancomycin  Zosyn  HPI/Subjective: Kelly Dickson Is a 70 year old female with a past medical history chronic systolic congestive heart failure, Having an ejection fraction of 25-30% per family members, hypertension, chronic atrial fibrillation on chronic anticoagulation therapy with Eliquis, admitted to the medicine  service on 11/25/2015 when she presented with complaints of left leg swelling, pain, erythema. Symptoms consistent with left lower extremity cellulitis.She had been treated with doxycycline in the outpatient setting. She was started on vancomycin and Zosyn during this hospitalization.  Objective: Filed Vitals:   11/26/15 0518 11/26/15 0821  BP: 130/49 141/63  Pulse: 62 71  Temp: 97.9 F (36.6 C) 97.8 F (36.6 C)  Resp: 16 17    Intake/Output Summary (Last 24 hours) at 11/26/15 1337 Last data filed at 11/26/15 1007  Gross per 24 hour  Intake    582 ml  Output      0 ml  Net    582 ml   Filed Weights   11/25/15 1300 11/25/15 1935  Weight: 88.451 kg (195 lb) 96.798 kg (213 lb 6.4 oz)    Exam:   General:  calm, cooperative, pleasant in no acute distress awake and alert  Cardiovascular: Regular rate and rhythm normal S1-S2  Respiratory: Normal is 24, lungs are clear to auscultation bilaterally  Abdomen: Soft nontender nondistended  Musculoskeletal: There is erythema and swelling involving her left lower extremity extending from ankle to just below the knee, associate with inspiration over posterior aspect of left lower extremity, there is no evidence of Purulence or fluctuant masses  Data Reviewed: Basic Metabolic Panel:  Recent Labs Lab 11/25/15 1345 11/26/15 0145  NA 139 142  K 3.0* 4.1  CL 101 102  CO2 31 30  GLUCOSE 126* 131*  BUN 18 14  CREATININE 0.71 0.83  CALCIUM 8.8* 8.8*  MG  --  2.0   Liver Function Tests:  Recent Labs Lab 11/25/15 1345 11/26/15 0145  AST 24 19  ALT 36 29  ALKPHOS 76 75  BILITOT 0.6 1.0  PROT 6.2*  5.4*  ALBUMIN 3.4* 2.7*   No results for input(s): LIPASE, AMYLASE in the last 168 hours. No results for input(s): AMMONIA in the last 168 hours. CBC:  Recent Labs Lab 11/25/15 1345 11/26/15 0145  WBC 10.5 8.0  NEUTROABS 9.3*  --   HGB 11.1* 10.1*  HCT 35.1* 32.0*  MCV 98.0 97.6  PLT 138* 128*   Cardiac Enzymes: No  results for input(s): CKTOTAL, CKMB, CKMBINDEX, TROPONINI in the last 168 hours. BNP (last 3 results)  Recent Labs  11/25/15 2234  BNP 854.0*    ProBNP (last 3 results) No results for input(s): PROBNP in the last 8760 hours.  CBG: No results for input(s): GLUCAP in the last 168 hours.  Recent Results (from the past 240 hour(s))  Culture, blood (Routine X 2) w Reflex to ID Panel     Status: None (Preliminary result)   Collection Time: 11/25/15  1:44 PM  Result Value Ref Range Status   Specimen Description BLOOD RIGHT ANTECUBITAL  Final   Special Requests BOTTLES DRAWN AEROBIC AND ANAEROBIC 5CC  Final   Culture PENDING  Incomplete   Report Status PENDING  Incomplete  Culture, blood (Routine X 2) w Reflex to ID Panel     Status: None (Preliminary result)   Collection Time: 11/25/15  1:50 PM  Result Value Ref Range Status   Specimen Description BLOOD LEFT ANTECUBITAL  Final   Special Requests BOTTLES DRAWN AEROBIC AND ANAEROBIC  5CC  Final   Culture PENDING  Incomplete   Report Status PENDING  Incomplete     Studies: US Venous Img Lower Unilateral Left  11/25/2015  CLINICAL DATA:  History of lower leg swelling, redness, tenderness for 2 weeks. Status post abrasion on the calf. Pain, edema, injury, color changes. History of atrial fibrillation and CHF. EXAM: LEFT LOWER EXTREMITY VENOUS DOPPLER ULTRASOUND TECHNIQUE: Gray-scale sonography with graded compression, as well as color Doppler and duplex ultrasound were performed to evaluate the lower extremity deep venous systems from the level of the common femoral vein and including the common femoral, femoral, profunda femoral, popliteal and calf veins including the posterior tibial, peroneal and gastrocnemius veins when visible. The superficial great saphenous vein was also interrogated. Spectral Doppler was utilized to evaluate flow at rest and with distal augmentation maneuvers in the common femoral, femoral and popliteal veins.  COMPARISON:  None. FINDINGS: Contralateral Common Femoral Vein: Respiratory phasicity is normal and symmetric with the symptomatic side. No evidence of thrombus. Normal compressibility. Common Femoral Vein: No evidence of thrombus. Normal compressibility, respiratory phasicity and response to augmentation. Saphenofemoral Junction: No evidence of thrombus. Normal compressibility and flow on color Doppler imaging. Profunda Femoral Vein: No evidence of thrombus. Normal compressibility and flow on color Doppler imaging. Femoral Vein: No evidence of thrombus. Normal compressibility, respiratory phasicity and response to augmentation. Popliteal Vein: No evidence of thrombus. Normal compressibility, respiratory phasicity and response to augmentation. Calf Veins: Limited visualization Superficial Great Saphenous Vein: Create left greater saphenous vein from the upper thigh to the knee is noncompressible. Only partial flow is seen in the upper thigh of the greater saphenous vein. Echogenic material is identified within the greater saphenous vein in the upper thigh, consistent with nonocclusive thrombus. Venous Reflux:  None. Other Findings:  Subcutaneous edema identified in the lower leg. IMPRESSION: 1. No evidence for occlusive deep vein thrombosis in the left lower extremity. 2. Nonocclusive thrombus identified within the left greater saphenous vein. 3. Lower extremity edema. Electronically Signed   By: Lanora Manis  Manson Passey M.D.   On: 11/25/2015 14:59    Scheduled Meds: . amiodarone  200 mg Oral BID  . apixaban  5 mg Oral BID  . aspirin EC  81 mg Oral Daily  . cholecalciferol  1,000 Units Oral Daily  . DULoxetine  30 mg Oral Daily  . furosemide  40 mg Oral Daily  . lactobacillus acidophilus & bulgar  1 tablet Oral QHS  . metoprolol tartrate  25 mg Oral BID  . multivitamin with minerals  1 tablet Oral Daily  . omega-3 acid ethyl esters  1 g Oral Daily  . piperacillin-tazobactam (ZOSYN)  IV  3.375 g Intravenous Q8H   . sacubitril-valsartan  1 tablet Oral BID  . sodium chloride  3 mL Intravenous Q12H  . spironolactone  12.5 mg Oral Daily  . vancomycin  1,000 mg Intravenous Q12H   Continuous Infusions:   Principal Problem:   Cellulitis of leg, left Active Problems:   Panic attack   A-fib (HCC)   CHF (congestive heart failure) (HCC)   Leg swelling   Essential hypertension   MR (mitral regurgitation)   TR (tricuspid regurgitation)   Pulmonary hypertension (HCC)   Hypokalemia    Time spent: 35 min    Kelly Dickson  [facility] Pager [phone]. If 7PM-7AM, please contact night-coverage at [email or web address], password [email or web address] 11/26/2015, 1:37 PM  LOS: 1 day

## 2015-11-26 NOTE — Evaluation (Signed)
Physical Therapy Evaluation Patient Details Name: Kelly Dickson MRN: [id number] DOB: 1946/01/11 Today's Date: 11/26/2015   History of Present Illness  HPI: Kelly Dickson is a 70 y.o. female with PMH of Systolic congestive heart failure (EF 25-30 percent per patient's daughter, I could not find 2-D echo record in Epic), hypertension, depression, anxiety, panic disorder, atrial fibrillation on Eliquis, pulmonary hypertension, mitral regurgitation, tricuspid regurgitation, who presents with left leg pain and swelling. Left leg venous Doppler is negative for DVT, but showed nonocclusive thrombus within the left greater saphenous vein  Clinical Impression   Pt admitted with above diagnosis. Pt currently with functional limitations due to the deficits listed below (see PT Problem List). I anticipate that with antibiotics her cellulitis will improve rapidly and she will likely progress to not needing an assistive device;  Pt will benefit from skilled PT to increase their independence and safety with mobility to allow discharge to the venue listed below.       Follow Up Recommendations No PT follow up    Equipment Recommendations  None recommended by PT (well-equipped from previous TKAs)    Recommendations for Other Services       Precautions / Restrictions Precautions Precautions: None Restrictions Weight Bearing Restrictions: No      Mobility  Bed Mobility Overal bed mobility: Independent                Transfers Overall transfer level: Needs assistance Equipment used: None;Rolling walker (2 wheeled) Transfers: Sit to/from Stand Sit to Stand: Supervision         General transfer comment: Noting very painful with Weight bearing LLE at initial stand, with initial unsteadiness without UE support on RW; Still, not needing phsyical assist; better with RW  Ambulation/Gait Ambulation/Gait assistance: Supervision Ambulation Distance (Feet): 150 Feet Assistive device:  Rolling walker (2 wheeled);None Gait Pattern/deviations: Step-to pattern;Step-through pattern Gait velocity: approaching WFL   General Gait Details: Step-to pattern initially, progressing to step-through, and with more time walking approached a gait pattern close to WNL; Needing RW/UE support at sink for first few steps to unweigh painful LLE; Able to walk back to room pushing IV pole without a lot of difficulty  Stairs            Wheelchair Mobility    Modified Rankin (Stroke Patients Only)       Balance Overall balance assessment: No apparent balance deficits (not formally assessed)                                           Pertinent Vitals/Pain Pain Assessment: 0-10 Pain Score: 6  Pain Location: LLE in dependent position, or with weight bearing Pain Descriptors / Indicators: Aching;Throbbing Pain Intervention(s): Limited activity within patient's tolerance;Monitored during session;Other (comment) (elevated LLE in recliner)    Home Living Family/patient expects to be discharged to:: Private residence Living Arrangements: Alone Available Help at Discharge: Family;Available PRN/intermittently Type of Home: House Home Access: Ramped entrance (on rainy days, will go thru basement -- flight of steps)     Home Layout: Two level;Able to live on main level with bedroom/bathroom;Laundry or work area in [employer] Equipment: Environmental consultant - 2 wheels;Bedside commode (has had TKAs)      Prior Function Level of Independence: Independent               Hand Dominance  Extremity/Trunk Assessment   Upper Extremity Assessment: Overall WFL for tasks assessed           Lower Extremity Assessment: LLE deficits/detail   LLE Deficits / Details: Pain, erythema, and swelling LLE below the knee; Hip, knee, ankle ROM WFL; pt and daughter report erythema is much improved with antibiotics     Communication   Communication: No difficulties  Cognition  Arousal/Alertness: Awake/alert Behavior During Therapy: WFL for tasks assessed/performed Overall Cognitive Status: Within Functional Limits for tasks assessed                      General Comments General comments (skin integrity, edema, etc.): Noted increased swelling after activity with LLE in dependent position with skin tight/shiny; Elevated extremity    Exercises General Exercises - Lower Extremity Pt was able to perform her HEP after her TKAs       Assessment/Plan    PT Assessment Patient needs continued PT services  PT Diagnosis Difficulty walking;Acute pain   PT Problem List Decreased activity tolerance;Decreased mobility;Decreased knowledge of use of DME;Pain  PT Treatment Interventions DME instruction;Gait training;Stair training;Functional mobility training;Therapeutic activities;Therapeutic exercise;Patient/family education   PT Goals (Current goals can be found in the Care Plan section) Acute Rehab PT Goals Patient Stated Goal: did not state PT Goal Formulation: With patient Time For Goal Achievement: 12/10/15 Potential to Achieve Goals: Good (I anticipate she'll meet goals in 1-2 sessions)    Frequency Min 3X/week   Barriers to discharge        Co-evaluation               End of Session   Activity Tolerance: Patient tolerated treatment well Patient left: in chair;with call bell/phone within reach Nurse Communication: Mobility status         Time: 8295-6213 PT Time Calculation (min) (ACUTE ONLY): 31 min   Charges:   PT Evaluation $PT Eval Low Complexity: 1 Procedure PT Treatments $Gait Training: 8-22 mins   PT G Codes:        Olen Pel 11/26/2015, 10:21 AM  Van Clines, PT  Acute Rehabilitation Services Pager [phone] Office [phone]

## 2015-11-27 LAB — BASIC METABOLIC PANEL
Anion gap: 10 (ref 5–15)
BUN: 14 mg/dL (ref 6–20)
CHLORIDE: 103 mmol/L (ref 101–111)
CO2: 27 mmol/L (ref 22–32)
Calcium: 8.4 mg/dL — ABNORMAL LOW (ref 8.9–10.3)
Creatinine, Ser: 0.77 mg/dL (ref 0.44–1.00)
GFR calc Af Amer: >60 mL/min (ref >60)
GFR calc non Af Amer: >60 mL/min (ref >60)
Glucose, Bld: 101 mg/dL — ABNORMAL HIGH (ref 65–99)
POTASSIUM: 3 mmol/L — AB (ref 3.5–5.1)
SODIUM: 140 mmol/L (ref 135–145)

## 2015-11-27 LAB — CBC
HEMATOCRIT: 31.7 % — AB (ref 36.0–46.0)
HEMOGLOBIN: 10.3 g/dL — AB (ref 12.0–15.0)
MCH: 31.9 pg (ref 26.0–34.0)
MCHC: 32.5 g/dL (ref 30.0–36.0)
MCV: 98.1 fL (ref 78.0–100.0)
Platelets: 144 10*3/uL — ABNORMAL LOW (ref 150–400)
RBC: 3.23 MIL/uL — AB (ref 3.87–5.11)
RDW: 13.8 % (ref 11.5–15.5)
WBC: 5.7 10*3/uL (ref 4.0–10.5)

## 2015-11-27 MED ORDER — POTASSIUM CHLORIDE CRYS ER 20 MEQ PO TBCR
40.0000 meq | EXTENDED_RELEASE_TABLET | Freq: Four times a day (QID) | ORAL | Status: AC
  Administered 2015-11-27 (×2): 40 meq via ORAL
  Filled 2015-11-27 (×2): qty 2

## 2015-11-27 NOTE — Progress Notes (Signed)
[facility] PROGRESS NOTE  Kelly TOPPIN ZOX:[id number] DOB: 1946-09-30 DOA: 11/25/2015 PCP: Karma Lew, FNP  Assessment/Plan: 1. Left lower extremity cellulitis -She presents with complaints of worsening left lower extremity pain, erythema, swelling, localized warmth. Reports having been scratched by her dog several weeks ago. Symptoms worsened despite being treated with a ten-day course of doxycycline -She had a left lower extremity ultrasound which did not reveal evidence for occlusive deep venous thrombosis. -On 11/27/2015 she continues to have extensive erythema, swelling involving left lower extremity. This is day 2 of IV antibiotics, will continue broad-spectrum coverage with IV vancomycin and Zosyn  2.  History of atrial fibrillation -Having a CHADSVasc score of 5 -She is chronically anticoagulated with Eliquis which has been continued during this hospitalization -Continue metoprolol 25 mg by mouth twice a day and amiodarone 200 mg by mouth twice a day. -She has been rate controlled for the past several days  3.  Chronic systolic congestive heart failure -Per family members patient having a history of systolic congestive heart failure having EF of 25-30%. -She remains euvolemic -Plan to continue spironolactone 12.5 mg daily and Lasix 40 mg by mouth daily  4.  Hypokalemia -A.m. lab work on 11/27/2015 showing potassium of 3.0, will replace with oral potassium  5.  Hypertension. -Blood pressures controlled, continue metoprolol 25 mg by mouth twice a day and Entresto 24/26 mg BID.   Code Status: Full Code Family Communication: I spoke with her daughter who was present at bedside Disposition Plan: Continue emperic AB coverage   Consultants:  Pharmacy  Antibiotics:  Vancomycin  Zosyn  HPI/Subjective: Mrs Kelly Dickson Is a 70 year old female with a past medical history chronic systolic congestive heart failure, Having an ejection fraction of 25-30% per family  members, hypertension, chronic atrial fibrillation on chronic anticoagulation therapy with Eliquis, admitted to the medicine service on 11/25/2015 when she presented with complaints of left leg swelling, pain, erythema. Symptoms consistent with left lower extremity cellulitis.She had been treated with doxycycline in the outpatient setting. She was started on vancomycin and Zosyn during this hospitalization.  Objective: Filed Vitals:   11/26/15 2102 11/27/15 0518  BP: 111/48 121/73  Pulse: 59 64  Temp: 98.7 F (37.1 C) 98.6 F (37 C)  Resp: 18 16    Intake/Output Summary (Last 24 hours) at 11/27/15 0930 Last data filed at 11/27/15 0600  Gross per 24 hour  Intake   1260 ml  Output      0 ml  Net   1260 ml   Filed Weights   11/25/15 1300 11/25/15 1935  Weight: 88.451 kg (195 lb) 96.798 kg (213 lb 6.4 oz)    Exam:   General:  calm, cooperative, pleasant in no acute distress awake and alert. She ambulated down the hallway and back.  Cardiovascular: Regular rate and rhythm normal S1-S2  Respiratory: Normal is 24, lungs are clear to auscultation bilaterally  Abdomen: Soft nontender nondistended  Musculoskeletal: There is erythema and swelling involving her left lower extremity extending from ankle to just below the knee, associate with inspiration over posterior aspect of left lower extremity, there is no evidence of Purulence or fluctuant masses  Data Reviewed: Basic Metabolic Panel:  Recent Labs Lab 11/25/15 1345 11/26/15 0145 11/27/15 0715  NA 139 142 140  K 3.0* 4.1 3.0*  CL 101 102 103  CO2 31 30 27   GLUCOSE 126* 131* 101*  BUN 18 14 14   CREATININE 0.71 0.83 0.77  CALCIUM 8.8* 8.8* 8.4*  MG  --  2.0  --    Liver Function Tests:  Recent Labs Lab 11/25/15 1345 11/26/15 0145  AST 24 19  ALT 36 29  ALKPHOS 76 75  BILITOT 0.6 1.0  PROT 6.2* 5.4*  ALBUMIN 3.4* 2.7*   No results for input(s): LIPASE, AMYLASE in the last 168 hours. No results for input(s):  AMMONIA in the last 168 hours. CBC:  Recent Labs Lab 11/25/15 1345 11/26/15 0145 11/27/15 0715  WBC 10.5 8.0 5.7  NEUTROABS 9.3*  --   --   HGB 11.1* 10.1* 10.3*  HCT 35.1* 32.0* 31.7*  MCV 98.0 97.6 98.1  PLT 138* 128* 144*   Cardiac Enzymes: No results for input(s): CKTOTAL, CKMB, CKMBINDEX, TROPONINI in the last 168 hours. BNP (last 3 results)  Recent Labs  11/25/15 2234  BNP 854.0*    ProBNP (last 3 results) No results for input(s): PROBNP in the last 8760 hours.  CBG: No results for input(s): GLUCAP in the last 168 hours.  Recent Results (from the past 240 hour(s))  Culture, blood (Routine X 2) w Reflex to ID Panel     Status: None (Preliminary result)   Collection Time: 11/25/15  1:44 PM  Result Value Ref Range Status   Specimen Description BLOOD RIGHT ANTECUBITAL  Final   Special Requests BOTTLES DRAWN AEROBIC AND ANAEROBIC 5CC  Final   Culture   Final    NO GROWTH < 24 HOURS Performed at [facility]    Report Status PENDING  Incomplete  Culture, blood (Routine X 2) w Reflex to ID Panel     Status: None (Preliminary result)   Collection Time: 11/25/15  1:50 PM  Result Value Ref Range Status   Specimen Description BLOOD LEFT ANTECUBITAL  Final   Special Requests BOTTLES DRAWN AEROBIC AND ANAEROBIC  5CC  Final   Culture   Final    NO GROWTH < 24 HOURS Performed at [facility]    Report Status PENDING  Incomplete     Studies: US Venous Img Lower Unilateral Left  11/25/2015  CLINICAL DATA:  History of lower leg swelling, redness, tenderness for 2 weeks. Status post abrasion on the calf. Pain, edema, injury, color changes. History of atrial fibrillation and CHF. EXAM: LEFT LOWER EXTREMITY VENOUS DOPPLER ULTRASOUND TECHNIQUE: Gray-scale sonography with graded compression, as well as color Doppler and duplex ultrasound were performed to evaluate the lower extremity deep venous systems from the level of the common femoral vein and including the  common femoral, femoral, profunda femoral, popliteal and calf veins including the posterior tibial, peroneal and gastrocnemius veins when visible. The superficial great saphenous vein was also interrogated. Spectral Doppler was utilized to evaluate flow at rest and with distal augmentation maneuvers in the common femoral, femoral and popliteal veins. COMPARISON:  None. FINDINGS: Contralateral Common Femoral Vein: Respiratory phasicity is normal and symmetric with the symptomatic side. No evidence of thrombus. Normal compressibility. Common Femoral Vein: No evidence of thrombus. Normal compressibility, respiratory phasicity and response to augmentation. Saphenofemoral Junction: No evidence of thrombus. Normal compressibility and flow on color Doppler imaging. Profunda Femoral Vein: No evidence of thrombus. Normal compressibility and flow on color Doppler imaging. Femoral Vein: No evidence of thrombus. Normal compressibility, respiratory phasicity and response to augmentation. Popliteal Vein: No evidence of thrombus. Normal compressibility, respiratory phasicity and response to augmentation. Calf Veins: Limited visualization Superficial Great Saphenous Vein: Create left greater saphenous vein from the upper thigh to the knee is noncompressible. Only partial flow is seen in  the upper thigh of the greater saphenous vein. Echogenic material is identified within the greater saphenous vein in the upper thigh, consistent with nonocclusive thrombus. Venous Reflux:  None. Other Findings:  Subcutaneous edema identified in the lower leg. IMPRESSION: 1. No evidence for occlusive deep vein thrombosis in the left lower extremity. 2. Nonocclusive thrombus identified within the left greater saphenous vein. 3. Lower extremity edema. Electronically Signed   By: Norva Pavlov M.D.   On: 11/25/2015 14:59    Scheduled Meds: . amiodarone  200 mg Oral BID  . apixaban  5 mg Oral BID  . aspirin EC  81 mg Oral Daily  .  cholecalciferol  1,000 Units Oral Daily  . DULoxetine  30 mg Oral Daily  . furosemide  40 mg Oral Daily  . lactobacillus acidophilus & bulgar  1 tablet Oral QHS  . metoprolol tartrate  25 mg Oral BID  . multivitamin with minerals  1 tablet Oral Daily  . omega-3 acid ethyl esters  1 g Oral Daily  . piperacillin-tazobactam (ZOSYN)  IV  3.375 g Intravenous Q8H  . sacubitril-valsartan  1 tablet Oral BID  . sodium chloride  3 mL Intravenous Q12H  . spironolactone  12.5 mg Oral Daily  . vancomycin  1,000 mg Intravenous Q12H   Continuous Infusions:   Principal Problem:   Cellulitis of leg, left Active Problems:   Panic attack   A-fib (HCC)   CHF (congestive heart failure) (HCC)   Leg swelling   Essential hypertension   MR (mitral regurgitation)   TR (tricuspid regurgitation)   Pulmonary hypertension (HCC)   Hypokalemia    Time spent: 30 min    Jeralyn Bennett  [facility] Pager [phone]. If 7PM-7AM, please contact night-coverage at [email or web address], password [facility] 11/27/2015, 9:30 AM  LOS: 2 days

## 2015-11-27 NOTE — Progress Notes (Signed)
Occupational Therapy Treatment and Discharge Summary Patient Details Name: ROBBYE DEDE MRN: [id number] DOB: 07/20/1946 Today's Date: 11/27/2015    History of present illness HPI: KARIANNE NOGUEIRA is a 70 y.o. female with PMH of Systolic congestive heart failure, hypertension, depression, knee surgery, anxiety, panic disorder, atrial fibrillation on Eliquis, pulmonary hypertension, mitral regurgitation, tricuspid regurgitation, who presents with left leg pain and swelling. Left leg venous Doppler is negative for DVT, but showed nonocclusive thrombus within the left greater saphenous vein   OT comments  Pt making good progress and now is mod I with all basic adls in her room. Pt bathed in shower sitting on shower chair and manages room and toileting w/o assist.  Pt given theraband for exercises at home and energy conservation information.  No further acute OT needs.  Follow Up Recommendations  No OT follow up    Equipment Recommendations  None recommended by OT    Recommendations for Other Services      Precautions / Restrictions Precautions Precautions: None Restrictions Weight Bearing Restrictions: No       Mobility Bed Mobility               General bed mobility comments: pt on EOB on arrival.  Transfers Overall transfer level: Modified independent Equipment used: None Transfers: Sit to/from Stand Sit to Stand: Modified independent (Device/Increase time)         General transfer comment: pt walking in room w/o physical assist.  uses walker when in hallway.    Balance Overall balance assessment: No apparent balance deficits (not formally assessed)                                 ADL Overall ADL's : Needs assistance/impaired Eating/Feeding: Independent;Sitting   Grooming: Wash/dry hands;Wash/dry face;Oral care;Modified independent;Standing   Upper Body Bathing: Modified independent;Sitting   Lower Body Bathing: Modified independent;Sit  to/from stand Lower Body Bathing Details (indicate cue type and reason): pt fully bathed in the shower Upper Body Dressing : Set up;Sitting   Lower Body Dressing: Modified independent;Sit to/from stand Lower Body Dressing Details (indicate cue type and reason): pt walked to drawers and retreived own clothing. Toilet Transfer: Modified Independent;Ambulation;Comfort height toilet;Grab bars Toilet Transfer Details (indicate cue type and reason): pt walked to bathroom without walker.   Toileting- Clothing Manipulation and Hygiene: Modified independent;Sit to/from stand Toileting - Clothing Manipulation Details (indicate cue type and reason): no physical assist needed. Pt wanting door shut while she toileted. Tub/ Shower Transfer: Gaffer;Modified independent;Shower seat (has tub shower at home with a bench.) Tub/Shower Transfer Details (indicate cue type and reason): no assist needed. Functional mobility during ADLs: Modified independent General ADL Comments: Pt given energy conservation hand out and pt utilizes many of these techniques already at home.      Vision                     Perception     Praxis      Cognition   Behavior During Therapy: [facility] for tasks assessed/performed Overall Cognitive Status: Within Functional Limits for tasks assessed                       Extremity/Trunk Assessment               Exercises Other Exercises Other Exercises: Pt given two levels of theraband and exercises for shoulders and elbows to  assist her moving forward.  Pt understands all exercises.   Shoulder Instructions       General Comments      Pertinent Vitals/ Pain       Pain Assessment: 0-10 Pain Score: 5  Pain Location: LLlE Pain Descriptors / Indicators: Tightness;Throbbing Pain Intervention(s): Limited activity within patient's tolerance;Monitored during session;Repositioned  Home Living                                           Prior Functioning/Environment              Frequency Min 2X/week     Progress Toward Goals  OT Goals(current goals can now be found in the care plan section)  Progress towards OT goals: Goals met/education completed, patient discharged from OT  Acute Rehab OT Goals Patient Stated Goal: not stated OT Goal Formulation: With patient Time For Goal Achievement: 12/03/15 Potential to Achieve Goals: Good ADL Goals Pt Will Transfer to Toilet: with modified independence;ambulating Additional ADL Goal #1: Pt will independently verbalize 3 energy conservation techniques and utilize in session as needed. Additional ADL Goal #2: Pt will independently perform HEP for bilateral UEs to increase strength.  Plan Discharge plan remains appropriate    Co-evaluation                 End of Session     Activity Tolerance Patient tolerated treatment well   Patient Left in bed;with call bell/phone within reach   Nurse Communication Mobility status        Time: 1030-1100 OT Time Calculation (min): 30 min  Charges: OT General Charges $OT Visit: 1 Procedure OT Treatments $Self Care/Home Management : 23-37 mins  Glenford Peers 11/27/2015, 10:56 AM  [phone]

## 2015-11-28 DIAGNOSIS — M7989 Other specified soft tissue disorders: Secondary | ICD-10-CM

## 2015-11-28 LAB — CBC
HEMATOCRIT: 36.6 % (ref 36.0–46.0)
HEMOGLOBIN: 11.6 g/dL — AB (ref 12.0–15.0)
MCH: 31.1 pg (ref 26.0–34.0)
MCHC: 31.7 g/dL (ref 30.0–36.0)
MCV: 98.1 fL (ref 78.0–100.0)
Platelets: 195 10*3/uL (ref 150–400)
RBC: 3.73 MIL/uL — AB (ref 3.87–5.11)
RDW: 13.8 % (ref 11.5–15.5)
WBC: 6.9 10*3/uL (ref 4.0–10.5)

## 2015-11-28 LAB — BASIC METABOLIC PANEL
ANION GAP: 11 (ref 5–15)
BUN: 16 mg/dL (ref 6–20)
CHLORIDE: 100 mmol/L — AB (ref 101–111)
CO2: 29 mmol/L (ref 22–32)
Calcium: 8.9 mg/dL (ref 8.9–10.3)
Creatinine, Ser: 0.83 mg/dL (ref 0.44–1.00)
GFR calc non Af Amer: >60 mL/min (ref >60)
Glucose, Bld: 102 mg/dL — ABNORMAL HIGH (ref 65–99)
POTASSIUM: 4.4 mmol/L (ref 3.5–5.1)
SODIUM: 140 mmol/L (ref 135–145)

## 2015-11-28 MED ORDER — VANCOMYCIN HCL IN DEXTROSE 1-5 GM/200ML-% IV SOLN
1000.0000 mg | Freq: Two times a day (BID) | INTRAVENOUS | Status: DC
  Filled 2015-11-28: qty 200

## 2015-11-28 NOTE — Care Management Important Message (Signed)
Important Message  Patient Details  Name: Kelly Dickson MRN: [id number] Date of Birth: [birth year]   Medicare Important Message Given:  Yes    Oralia Rud Delorse Shane 11/28/2015, 2:34 PM

## 2015-11-28 NOTE — Progress Notes (Signed)
Pharmacy Antibiotic Follow-up Note  Kelly Dickson is a 70 y.o. year-old female admitted on 11/25/2015.  The patient is currently on day 4 of Vanco/Zosyn for cellulitis.  Assessment/Plan: 69 yoF, LLE cellulitis, pharmacy consulted to dose Vanc/Zosyn. Reports having been scratched by her dog several weeks ago.   ID: Vanc/zosyn D#4 for cellulitis - Afebrile, WBC WNL, Scr 0.77 AF LLE cellulitis - sxs worsened despite 10 day course doxy. [place] neg for DVT. Less red, less tender today.. Continue broad spectrum abx.   Vanc 1/23>> Zosyn 1/23>>  1/23 BC x 2 >>ngtd  Plan:  - Apixaban 5mg  PO BID - Zosyn 3.375gm IV Q8H (4 hr inf) - Vanc 1gm IV Q12H - I think q12 is probably too much for 70 yo F. Trough tonight.  [name] S. Merilynn Finland, PharmD, BCPS Clinical Staff Pharmacist Pager [phone]   Temp (24hrs), Avg:98.3 F (36.8 C), Min:97.6 F (36.4 C), Max:98.7 F (37.1 C)   Recent Labs Lab 11/25/15 1345 11/26/15 0145 11/27/15 0715 11/28/15 0543  WBC 10.5 8.0 5.7 6.9    Recent Labs Lab 11/25/15 1345 11/26/15 0145 11/27/15 0715 11/28/15 0543  CREATININE 0.71 0.83 0.77 0.83   Estimated Creatinine Clearance: 67.9 mL/min (by C-G formula based on Cr of 0.83).    No Known Allergies   Pasty Spillers PharmD 11/28/2015 2:50 PM

## 2015-11-28 NOTE — Progress Notes (Signed)
Physical Therapy Treatment Patient Details Name: Kelly Dickson MRN: [id number] DOB: [birth year] Today's Date: 11/28/2015    History of Present Illness HPI: Kelly Dickson is a 70 y.o. female with PMH of Systolic congestive heart failure (EF 25-30 percent per patient's daughter, I could not find 2-D echo record in Epic), hypertension, depression, anxiety, panic disorder, atrial fibrillation on Eliquis, pulmonary hypertension, mitral regurgitation, tricuspid regurgitation, who presents with left leg pain and swelling. Left leg venous Doppler is negative for DVT, but showed nonocclusive thrombus within the left greater saphenous vein    PT Comments    Continues to make good progress towards functional goals. Tolerating ambulation better without an assistive device, occasionally holding rail in hallway. Will continue to follow and progress until d/c.  Follow Up Recommendations  No PT follow up     Equipment Recommendations  None recommended by PT (well-equipped from previous TKAs)    Recommendations for Other Services       Precautions / Restrictions Precautions Precautions: None Restrictions Weight Bearing Restrictions: No    Mobility  Bed Mobility               General bed mobility comments: Standing when PT entered room  Transfers                 General transfer comment: Standing when PT entered room  Ambulation/Gait Ambulation/Gait assistance: Modified independent (Device/Increase time) Ambulation Distance (Feet): 225 Feet Assistive device: None Gait Pattern/deviations: Step-through pattern;Decreased stride length;Wide base of support;Drifts right/left Gait velocity: approaching WFL Gait velocity interpretation: Below normal speed for age/gender General Gait Details: Mild sway noted but able to self correct. Declined use of RW for support. Occasionally touching rail in hallway for balance. Did not require physical assist. She required one standing rest  break to complete full distance. No overt loss of balance. Cues for awareness and safety.   Stairs            Wheelchair Mobility    Modified Rankin (Stroke Patients Only)       Balance                                    Cognition Arousal/Alertness: Awake/alert Behavior During Therapy: WFL for tasks assessed/performed Overall Cognitive Status: Within Functional Limits for tasks assessed                      Exercises General Exercises - Lower Extremity Ankle Circles/Pumps: AROM;Left;5 reps;Seated    General Comments        Pertinent Vitals/Pain Pain Assessment: Faces Faces Pain Scale: Hurts little more Pain Location: LLE Pain Descriptors / Indicators: Throbbing Pain Intervention(s): Monitored during session;Repositioned    Home Living                      Prior Function            PT Goals (current goals can now be found in the care plan section) Acute Rehab PT Goals Patient Stated Goal: did not state PT Goal Formulation: With patient Time For Goal Achievement: 12/10/15 Potential to Achieve Goals: Good (I anticipate she'll meet goals in 1-2 sessions) Progress towards PT goals: Progressing toward goals    Frequency  Min 3X/week    PT Plan Current plan remains appropriate    Co-evaluation             End  of Session   Activity Tolerance: Patient tolerated treatment well Patient left: with call bell/phone within reach;in bed     Time: 1049-1104 PT Time Calculation (min) (ACUTE ONLY): 15 min  Charges:  $Gait Training: 8-22 mins                    G Codes:      Kelly Dickson 28-Dec-2015, 12:08 PM Kelly Dickson Kelly Dickson, [place] [phone]

## 2015-11-28 NOTE — Progress Notes (Signed)
PROGRESS NOTE  Kelly Dickson ZOX:[id number] DOB: September 29, 1946 DOA: 11/25/2015 PCP: Karma Lew, FNP  HPI: Kelly Dickson Is a 70 year old female with a past medical history chronic systolic congestive heart failure, Having an ejection fraction of 25-30% per family members, hypertension, chronic atrial fibrillation on chronic anticoagulation therapy with Eliquis, admitted to the medicine service on 11/25/2015 when she presented with complaints of left leg swelling, pain, erythema. Symptoms consistent with left lower extremity cellulitis.She had been treated with doxycycline in the outpatient setting. She was started on vancomycin and Zosyn during this hospitalization.  Subjective / 24 H Interval events - appreciates improvement in her LLE cellulitis, less red and less tender - pain mostly on posterior calf  Assessment/Plan: Principal Problem:   Cellulitis of leg, left Active Problems:   Panic attack   A-fib (HCC)   CHF (congestive heart failure) (HCC)   Leg swelling   Essential hypertension   MR (mitral regurgitation)   TR (tricuspid regurgitation)   Pulmonary hypertension (HCC)   Hypokalemia  Left lower extremity cellulitis - She presents with complaints of worsening left lower extremity pain, erythema, swelling, localized warmth. Reports having been scratched by her dog several weeks ago. Symptoms worsened despite being treated with a ten-day course of doxycycline - She had a left lower extremity ultrasound which did not reveal evidence for occlusive deep venous thrombosis. - On 11/28/2015 she continues to have extensive erythema, swelling involving left lower extremity, however improving.  - day #3 of IV antibiotics, will continue broad-spectrum coverage with IV vancomycin and Zosyn for now  History of atrial fibrillation - Having a CHADSVasc score of 5 - She is chronically anticoagulated with Eliquis which has been continued during this hospitalization - Continue  metoprolol 25 mg by mouth twice a day and amiodarone 200 mg by mouth twice a day. - She has been rate controlled for the past several days  Chronic systolic congestive heart failure - Per family members patient having a history of systolic congestive heart failure having EF of 25-30%. - She remains euvolemic - Plan to continue spironolactone 12.5 mg daily and Lasix 40 mg by mouth daily  Hypokalemia - A.m. lab work yesterday showing potassium of 3.0, repleted - improved to 4.4 this morning with supplementation   Hypertension. - Blood pressures controlled 121/48, continue metoprolol 25 mg by mouth twice a day and Entresto 24/26 mg BID.    Diet: Diet 2 gram sodium Room service appropriate?: Yes; Fluid consistency:: Thin Fluids: none DVT Prophylaxis: Eliquis  Code Status: Full Code Family Communication: no family bedside  Disposition Plan: home when ready   Barriers to discharge: IV antibiotics  Consultants:  None   Procedures:  None    Antibiotics Vancomycin 1/23 >> Zosyn 1/23 >>   Studies  No results found.  Objective  Filed Vitals:   11/27/15 2013 11/28/15 0501 11/28/15 0950 11/28/15 1000  BP: 122/37 142/41 120/73 121/48  Pulse: 60 54 75 64  Temp: 98.7 F (37.1 C) 98.4 F (36.9 C)  98.4 F (36.9 C)  TempSrc: Oral Oral  Oral  Resp: 19 20  20   Height:      Weight: 88.5 kg (195 lb 1.7 oz)     SpO2: 96% 97%  96%    Intake/Output Summary (Last 24 hours) at 11/28/15 1329 Last data filed at 11/28/15 0900  Gross per 24 hour  Intake   1052 ml  Output      0 ml  Net   1052 ml  Filed Weights   11/25/15 1300 11/25/15 1935 11/27/15 2013  Weight: 88.451 kg (195 lb) 96.798 kg (213 lb 6.4 oz) 88.5 kg (195 lb 1.7 oz)    Exam:  GENERAL: NAD  HEENT: no scleral icterus, PERRL  NECK: supple, no LAD  LUNGS: CTA biL, no wheezing  HEART: RRR without MRG  ABDOMEN: soft, non tender  EXTREMITIES: no clubbing / cyanosis. LLE erythema and swelling extending  from below the knee towards the ankle, more erythematous posteriorly  NEUROLOGIC: non focal  PSYCHIATRIC: normal mood and affect  SKIN: no rashes  Data Reviewed: Basic Metabolic Panel:  Recent Labs Lab 11/25/15 1345 11/26/15 0145 11/27/15 0715 11/28/15 0543  NA 139 142 140 140  K 3.0* 4.1 3.0* 4.4  CL 101 102 103 100*  CO2 31 30 27 29   GLUCOSE 126* 131* 101* 102*  BUN 18 14 14 16   CREATININE 0.71 0.83 0.77 0.83  CALCIUM 8.8* 8.8* 8.4* 8.9  MG  --  2.0  --   --    Liver Function Tests:  Recent Labs Lab 11/25/15 1345 11/26/15 0145  AST 24 19  ALT 36 29  ALKPHOS 76 75  BILITOT 0.6 1.0  PROT 6.2* 5.4*  ALBUMIN 3.4* 2.7*   CBC:  Recent Labs Lab 11/25/15 1345 11/26/15 0145 11/27/15 0715 11/28/15 0543  WBC 10.5 8.0 5.7 6.9  NEUTROABS 9.3*  --   --   --   HGB 11.1* 10.1* 10.3* 11.6*  HCT 35.1* 32.0* 31.7* 36.6  MCV 98.0 97.6 98.1 98.1  PLT 138* 128* 144* 195   BNP (last 3 results)  Recent Labs  11/25/15 2234  BNP 854.0*    Recent Results (from the past 240 hour(s))  Culture, blood (Routine X 2) w Reflex to ID Panel     Status: None (Preliminary result)   Collection Time: 11/25/15  1:44 PM  Result Value Ref Range Status   Specimen Description BLOOD RIGHT ANTECUBITAL  Final   Special Requests BOTTLES DRAWN AEROBIC AND ANAEROBIC 5CC  Final   Culture   Final    NO GROWTH 2 DAYS Performed at [facility]    Report Status PENDING  Incomplete  Culture, blood (Routine X 2) w Reflex to ID Panel     Status: None (Preliminary result)   Collection Time: 11/25/15  1:50 PM  Result Value Ref Range Status   Specimen Description BLOOD LEFT ANTECUBITAL  Final   Special Requests BOTTLES DRAWN AEROBIC AND ANAEROBIC  5CC  Final   Culture   Final    NO GROWTH 2 DAYS Performed at [facility]    Report Status PENDING  Incomplete     Scheduled Meds: . amiodarone  200 mg Oral BID  . apixaban  5 mg Oral BID  . aspirin EC  81 mg Oral Daily  .  cholecalciferol  1,000 Units Oral Daily  . DULoxetine  30 mg Oral Daily  . furosemide  40 mg Oral Daily  . lactobacillus acidophilus & bulgar  1 tablet Oral QHS  . metoprolol tartrate  25 mg Oral BID  . multivitamin with minerals  1 tablet Oral Daily  . omega-3 acid ethyl esters  1 g Oral Daily  . piperacillin-tazobactam (ZOSYN)  IV  3.375 g Intravenous Q8H  . sacubitril-valsartan  1 tablet Oral BID  . sodium chloride  3 mL Intravenous Q12H  . spironolactone  12.5 mg Oral Daily  . vancomycin  1,000 mg Intravenous Q12H   Continuous Infusions:  Pamella Pert, MD [facility] Pager [phone]. If 7 PM - 7 AM, please contact night-coverage at [email or web address], password [facility] 11/28/2015, 1:29 PM  LOS: 3 days

## 2015-11-28 NOTE — Care Management Note (Signed)
Case Management Note  Patient Details  Name: DEVENY PEGUES MRN: [id number] Date of Birth: [birth year]  Subjective/Objective:        Left lower extremity cellulitis            Action/Plan: NCM spoke to pt. States she is independent at home. Drives her to her appointments. Her dtr lives next door and assist with her care if needed. States she can afford her medications. States she is grieving the passing of her son, Viviann Spare that passed away two weeks ago. Reports having RW and 3n1 at home from her previous knee replacement. She had HH with [facility] in the past. No PT/OT recommended. Pt states she will be able to care for herself once she goes home. NCM will continue to follow for dc needs.   Expected Discharge Date:  11/30/2015               Expected Discharge Plan:  Home/Self Care  In-House Referral:  NA  Discharge planning Services  CM Consult  Post Acute Care Choice:  NA Choice offered to:  NA  DME Arranged:  N/A DME Agency:  NA  HH Arranged:  NA HH Agency:  NA  Status of Service:  Completed, signed off  Medicare Important Message Given:  Yes Date Medicare IM Given:    Medicare IM give by:    Date Additional Medicare IM Given:    Additional Medicare Important Message give by:     If discussed at Long Length of Stay Meetings, dates discussed:    Additional Comments:  Elliot Cousin, RN 11/28/2015, 2:50 PM

## 2015-11-29 LAB — VANCOMYCIN, TROUGH: Vancomycin Tr: 23 ug/mL — ABNORMAL HIGH (ref 10.0–20.0)

## 2015-11-29 MED ORDER — VANCOMYCIN HCL IN DEXTROSE 1-5 GM/200ML-% IV SOLN
1000.0000 mg | INTRAVENOUS | Status: DC
  Administered 2015-11-29: 1000 mg via INTRAVENOUS
  Filled 2015-11-29 (×2): qty 200

## 2015-11-29 NOTE — Progress Notes (Deleted)
Attempted 3 times to call [facility], no answer from staff. Notified social work, advised to proceed with discharge.    Ok Anis, RN 11/29/2015 7:34 PM

## 2015-11-29 NOTE — Progress Notes (Signed)
Physical Therapy Treatment Patient Details Name: Kelly Dickson MRN: [id number] DOB: 10-02-1946 Today's Date: 11/29/2015    History of Present Illness HPI: Kelly Dickson is a 70 y.o. female with PMH of Systolic congestive heart failure (EF 25-30 percent per patient's daughter, I could not find 2-D echo record in Epic), hypertension, depression, anxiety, panic disorder, atrial fibrillation on Eliquis, pulmonary hypertension, mitral regurgitation, tricuspid regurgitation, who presents with left leg pain and swelling. Left leg venous Doppler is negative for DVT, but showed nonocclusive thrombus within the left greater saphenous vein    PT Comments    Pt is getting up to walk with RW today due to her new puncture site on LLE posterior calf.  Per pt she struck it on the chair when she got up to regular chair over being tired of recliner.  Nursing was notified to come re-dress the leg and give approval for a bath due to new skin change.  Follow Up Recommendations  No PT follow up     Equipment Recommendations  None recommended by PT    Recommendations for Other Services       Precautions / Restrictions Precautions Precautions: None Restrictions Weight Bearing Restrictions: No    Mobility  Bed Mobility Overal bed mobility: Modified Independent                Transfers Overall transfer level: Modified independent Equipment used: Rolling walker (2 wheeled);1 person hand held assist Transfers: Sit to/from [employer] Sit to Stand: Supervision;Min guard Stand pivot transfers: Supervision;Min guard       General transfer comment: Pt has been reportedly taking a shower by herself  Ambulation/Gait Ambulation/Gait assistance: Min guard Ambulation Distance (Feet): 200 Feet Assistive device: 1 person hand held assist;Rolling walker (2 wheeled) (per pt request due to puncturing her L leg on the chair) Gait Pattern/deviations: Step-through pattern;Wide base of  support;Drifts right/left Gait velocity: approaching WFL Gait velocity interpretation: Below normal speed for age/gender General Gait Details: asked for walker, did show some unsteadiness with PT and did use contact of therapist and  gait belt   Stairs            Wheelchair Mobility    Modified Rankin (Stroke Patients Only)       Balance Overall balance assessment: Modified Independent                                  Cognition Arousal/Alertness: Awake/alert Behavior During Therapy: WFL for tasks assessed/performed Overall Cognitive Status: Within Functional Limits for tasks assessed                      Exercises      General Comments General comments (skin integrity, edema, etc.): new puncture on L posterior calf pt had just created when PT entered      Pertinent Vitals/Pain Pain Assessment: Faces Pain Score: 0-No pain Faces Pain Scale: No hurt    Home Living                      Prior Function            PT Goals (current goals can now be found in the care plan section) Acute Rehab PT Goals Patient Stated Goal: did not state Progress towards PT goals: Progressing toward goals    Frequency  Min 3X/week    PT Plan Current plan remains appropriate  Co-evaluation             End of Session Equipment Utilized During Treatment: Gait belt Activity Tolerance: Patient tolerated treatment well Patient left: with call bell/phone within reach;in bed     Time: 1610-9604 PT Time Calculation (min) (ACUTE ONLY): 27 min  Charges:  $Gait Training: 8-22 mins $Therapeutic Activity: 8-22 mins                    G Codes:      Ivar Drape 05-Dec-2015, 5:25 PM   Samul Dada, PT MS Acute Rehab Dept. Number: [facility] [id number] and [facility] [phone]

## 2015-11-29 NOTE — Progress Notes (Signed)
PROGRESS NOTE  Kelly Dickson DXI:[id number] DOB: 11-Aug-1946 DOA: 11/25/2015 PCP: Karma Lew, FNP  HPI: Kelly Dickson Is a 70 year old female with a past medical history chronic systolic congestive heart failure, Having an ejection fraction of 25-30% per family members, hypertension, chronic atrial fibrillation on chronic anticoagulation therapy with Eliquis, admitted to the medicine service on 11/25/2015 when she presented with complaints of left leg swelling, pain, erythema. Symptoms consistent with left lower extremity cellulitis.She had been treated with doxycycline in the outpatient setting. She was started on vancomycin and Zosyn during this hospitalization.  Subjective / 24 H Interval events - No complaints this morning, appreciates ongoing improvement in her leg - no chest pain, shortness of breath, no abdominal pain, nausea or vomiting.   Assessment/Plan: Principal Problem:   Cellulitis of leg, left Active Problems:   Panic attack   A-fib (HCC)   CHF (congestive heart failure) (HCC)   Leg swelling   Essential hypertension   MR (mitral regurgitation)   TR (tricuspid regurgitation)   Pulmonary hypertension (HCC)   Hypokalemia   Left lower extremity cellulitis - She presents with complaints of worsening left lower extremity pain, erythema, swelling, localized warmth. Reports having been scratched by her dog several weeks ago. Symptoms worsened despite being treated with a ten-day course of doxycycline - She had a left lower extremity ultrasound which did not reveal evidence for occlusive deep venous thrombosis. - day #4 of IV antibiotics, will continue broad-spectrum coverage with IV vancomycin and Zosyn for now - improving, anticipate transition to oral antibiotics tomorrow   History of atrial fibrillation - Having a CHADSVasc score of 5 - She is chronically anticoagulated with Eliquis which has been continued during this hospitalization - Continue metoprolol 25 mg  by mouth twice a day and amiodarone 200 mg by mouth twice a day. - She has been rate controlled for the past several days  Chronic systolic congestive heart failure - Per family members patient having a history of systolic congestive heart failure having EF of 25-30%. - She remains euvolemic - Plan to continue spironolactone 12.5 mg daily and Lasix 40 mg by mouth daily  Hypokalemia - A.m. lab work yesterday showing potassium of 3.0, repleted - stable  Hypertension. - Blood pressures controlled 121/48, continue metoprolol 25 mg by mouth twice a day and Entresto 24/26 mg BID.    Diet: Diet 2 gram sodium Room service appropriate?: Yes; Fluid consistency:: Thin Fluids: none DVT Prophylaxis: Eliquis  Code Status: Full Code Family Communication: no family bedside  Disposition Plan: home when ready, likely 24 hours Barriers to discharge: IV antibiotics  Consultants:  None   Procedures:  None    Antibiotics Vancomycin 1/23 >> Zosyn 1/23 >>   Studies  No results found.  Objective  Filed Vitals:   11/28/15 2159 11/29/15 0427 11/29/15 0500 11/29/15 1019  BP: 118/68 120/63  122/61  Pulse: 62 58  70  Temp: 98.5 F (36.9 C) 98.7 F (37.1 C)    TempSrc: Oral Oral    Resp: 19 20    Height:      Weight: 88.4 kg (194 lb 14.2 oz)  88.4 kg (194 lb 14.2 oz)   SpO2: 98% 95%  99%    Intake/Output Summary (Last 24 hours) at 11/29/15 1202 Last data filed at 11/29/15 0900  Gross per 24 hour  Intake    770 ml  Output      0 ml  Net    770 ml   Filed  Weights   11/27/15 2013 11/28/15 2159 11/29/15 0500  Weight: 88.5 kg (195 lb 1.7 oz) 88.4 kg (194 lb 14.2 oz) 88.4 kg (194 lb 14.2 oz)    Exam:  GENERAL: NAD  HEENT: no scleral icterus  NECK: supple, no LAD  LUNGS: CTA biL, no wheezing  HEART: RRR without MRG  ABDOMEN: soft, non tender  EXTREMITIES: no clubbing / cyanosis. LLE erythema and swelling extending from below the knee towards the ankle, more erythematous  posteriorly, improving today  Data Reviewed: Basic Metabolic Panel:  Recent Labs Lab 11/25/15 1345 11/26/15 0145 11/27/15 0715 11/28/15 0543  NA 139 142 140 140  K 3.0* 4.1 3.0* 4.4  CL 101 102 103 100*  CO2 GLUCOSE 126* 131* 101* 102*  BUN CREATININE 0.71 0.83 0.77 0.83  CALCIUM 8.8* 8.8* 8.4* 8.9  MG  --  2.0  --   --    Liver Function Tests:  Recent Labs Lab 11/25/15 1345 11/26/15 0145  AST 24 19  ALT 36 29  ALKPHOS 76 75  BILITOT 0.6 1.0  PROT 6.2* 5.4*  ALBUMIN 3.4* 2.7*   CBC:  Recent Labs Lab 11/25/15 1345 11/26/15 0145 11/27/15 0715 11/28/15 0543  WBC 10.5 8.0 5.7 6.9  NEUTROABS 9.3*  --   --   --   HGB 11.1* 10.1* 10.3* 11.6*  HCT 35.1* 32.0* 31.7* 36.6  MCV 98.0 97.6 98.1 98.1  PLT 138* 128* 144* 195   BNP (last 3 results)  Recent Labs  11/25/15 2234  BNP 854.0*    Recent Results (from the past 240 hour(s))  Culture, blood (Routine X 2) w Reflex to ID Panel     Status: None (Preliminary result)   Collection Time: 11/25/15  1:44 PM  Result Value Ref Range Status   Specimen Description BLOOD RIGHT ANTECUBITAL  Final   Special Requests BOTTLES DRAWN AEROBIC AND ANAEROBIC 5CC  Final   Culture   Final    NO GROWTH 3 DAYS Performed at [facility]    Report Status PENDING  Incomplete  Culture, blood (Routine X 2) w Reflex to ID Panel     Status: None (Preliminary result)   Collection Time: 11/25/15  1:50 PM  Result Value Ref Range Status   Specimen Description BLOOD LEFT ANTECUBITAL  Final   Special Requests BOTTLES DRAWN AEROBIC AND ANAEROBIC  5CC  Final   Culture   Final    NO GROWTH 3 DAYS Performed at [facility]    Report Status PENDING  Incomplete     Scheduled Meds: . amiodarone  200 mg Oral BID  . apixaban  5 mg Oral BID  . aspirin EC  81 mg Oral Daily  . cholecalciferol  1,000 Units Oral Daily  . DULoxetine  30 mg Oral Daily  . furosemide  40 mg Oral Daily  . lactobacillus  acidophilus & bulgar  1 tablet Oral QHS  . metoprolol tartrate  25 mg Oral BID  . multivitamin with minerals  1 tablet Oral Daily  . omega-3 acid ethyl esters  1 g Oral Daily  . piperacillin-tazobactam (ZOSYN)  IV  3.375 g Intravenous Q8H  . sacubitril-valsartan  1 tablet Oral BID  . sodium chloride  3 mL Intravenous Q12H  . spironolactone  12.5 mg Oral Daily  . vancomycin  1,000 mg Intravenous Q24H   Continuous Infusions:    Pamella Pert, MD [facility] Pager [phone]. If 7  PM - 7 AM, please contact night-coverage at [email or web address], password [facility] 11/29/2015, 12:02 PM  LOS: 4 days

## 2015-11-29 NOTE — Progress Notes (Signed)
Pharmacy Antibiotic Follow-up Note  Kelly Dickson is a 70 y.o. year-old female admitted on 11/25/2015.  The patient is currently on day 4 of Vanco/Zosyn for cellulitis.  Assessment/Plan: 69 yoF, LLE cellulitis, pharmacy consulted to dose Vanc/Zosyn. Reports having been scratched by her dog several weeks ago.   ID: Vanc/zosyn D#5 for cellulitis - Afebrile, WBC WNL, Scr 0.77 AF LLE cellulitis - sxs worsened despite 10 day course doxy. [place] neg for DVT. Less red, less tender today.. Continue broad spectrum abx.   Vanc 1/23>> Zosyn 1/23>>  1/27 VT = 23  1/23 BC x 2 >>ngtd  Plan:  - Apixaban 5mg  PO BID - Zosyn 3.375gm IV Q8H (4 hr inf) - Decrease Vancomycin to 1 gram iv Q 24 hours starting 1/27 at 1600 pm      Temp (24hrs), Avg:98.4 F (36.9 C), Min:98.2 F (36.8 C), Max:98.5 F (36.9 C)   Recent Labs Lab 11/25/15 1345 11/26/15 0145 11/27/15 0715 11/28/15 0543  WBC 10.5 8.0 5.7 6.9     Recent Labs Lab 11/25/15 1345 11/26/15 0145 11/27/15 0715 11/28/15 0543  CREATININE 0.71 0.83 0.77 0.83   Estimated Creatinine Clearance: 67.9 mL/min (by C-G formula based on Cr of 0.83).    No Known Allergies  Thank you Okey Regal, PharmD [phone] 11/29/2015 3:13 AM

## 2015-11-30 LAB — CULTURE, BLOOD (ROUTINE X 2)
CULTURE: NO GROWTH
CULTURE: NO GROWTH

## 2015-11-30 MED ORDER — CIPROFLOXACIN HCL 500 MG PO TABS: 500.0000 mg | ORAL_TABLET | Freq: Two times a day (BID) | ORAL | Status: DC

## 2015-11-30 MED ORDER — CLINDAMYCIN HCL 300 MG PO CAPS: 300.0000 mg | ORAL_CAPSULE | Freq: Three times a day (TID) | ORAL | Status: DC

## 2015-11-30 MED ORDER — BACITRACIN 500 UNIT/GM EX OINT: 1.0000 "application " | TOPICAL_OINTMENT | Freq: Every day | CUTANEOUS | Status: DC

## 2015-11-30 NOTE — Discharge Summary (Signed)
Physician Discharge Summary  Kelly Dickson PYK:[id number] DOB: [birth year] DOA: 11/25/2015  PCP: Kelly Lew, FNP  Admit date: 11/25/2015 Discharge date: 11/30/2015  Time spent: > 30 minutes  Recommendations for Outpatient Follow-up:  1. Outpatient referral to Heart failure clinic 2. Outpatient referral to Vascular Surgery    Discharge Diagnoses:  Principal Problem:   Cellulitis of leg, left Active Problems:   Panic attack   A-fib (HCC)   CHF (congestive heart failure) (HCC)   Leg swelling   Essential hypertension   MR (mitral regurgitation)   TR (tricuspid regurgitation)   Pulmonary hypertension (HCC)   Hypokalemia  Discharge Condition: stable  Diet recommendation: heart healthy  Filed Weights   11/28/15 2159 11/29/15 0500 11/30/15 0500  Weight: 88.4 kg (194 lb 14.2 oz) 88.4 kg (194 lb 14.2 oz) 88.4 kg (194 lb 14.2 oz)    History of present illness:  See H&P, Labs, Consult and Test reports for all details in brief, patient is a [name] Is a 71 year old female with a past medical history chronic systolic congestive heart failure, Having an ejection fraction of 25-30% per family members, hypertension, chronic atrial fibrillation on chronic anticoagulation therapy with Eliquis, admitted to the medicine service on 11/25/2015 when she presented with complaints of left leg swelling, pain, erythema. Symptoms consistent with left lower extremity cellulitis.She had been treated with doxycycline in the outpatient setting. She was started on vancomycin and Zosyn during this hospitalization.  Hospital Course:  Left lower extremity cellulitis - She presents with complaints of worsening left lower extremity pain, erythema, swelling, localized warmth. Reports having been scratched by her dog several weeks ago. Symptoms worsened despite being treated with a ten-day course of doxycycline. She had a left lower extremity ultrasound which did not reveal evidence for occlusive deep  venous thrombosis. She is also on Eliquis. She was maintained with IV Vancomycin and Zosyn for 5 days, with significant improvement in her cellulitis, her erythema resolved and was transitioned on oral agents with Clindamycin and Ciprofloxacin on discharge to complete a course.  History of atrial fibrillation - Having a CHADSVasc score of 5, She is chronically anticoagulated with Eliquis which has been continued during this hospitalization. Continue metoprolol 25 mg by mouth twice a day and amiodarone 200 mg by mouth twice a day. She has been rate controlled Chronic systolic congestive heart failure - Per family members patient having a history of systolic congestive heart failure having EF of 25-30%. She remains euvolemic, continue spironolactone 12.5 mg daily and Lasix 40 mg. Continue Entresto. Patient wants to get established with outpatient cardiology, information regarding CHF clinic was given to patient.  Hypokalemia - stable Hypertension - controlled  Procedures:  None    Consultations:  None   Discharge Exam: Filed Vitals:   11/29/15 2057 11/30/15 0500 11/30/15 0517 11/30/15 0941  BP: 112/65  117/39 126/44  Pulse: 65  55 73  Temp: 98 F (36.7 C)  98.3 F (36.8 C) 98 F (36.7 C)  TempSrc: Oral  Oral Oral  Resp: 18  16 18   Height:      Weight:  88.4 kg (194 lb 14.2 oz)    SpO2: 98%  97% 98%    General: NAD Cardiovascular: RRR Respiratory: CTA biL  Discharge Instructions Activity:  As tolerated   Get Medicines reviewed and adjusted: Please take all your medications with you for your next visit with your Primary MD  Please request your Primary MD to go over all hospital tests and  procedure/radiological results at the follow up, please ask your Primary MD to get all Hospital records sent to his/her office.  If you experience worsening of your admission symptoms, develop shortness of breath, life threatening emergency, suicidal or homicidal thoughts you must seek medical  attention immediately by calling 911 or calling your MD immediately if symptoms less severe.  You must read complete instructions/literature along with all the possible adverse reactions/side effects for all the Medicines you take and that have been prescribed to you. Take any new Medicines after you have completely understood and accpet all the possible adverse reactions/side effects.   Do not drive when taking Pain medications.   Do not take more than prescribed Pain, Sleep and Anxiety Medications  Special Instructions: If you have smoked or chewed Tobacco in the last 2 yrs please stop smoking, stop any regular Alcohol and or any Recreational drug use.  Wear Seat belts while driving.  Please note  You were cared for by a hospitalist during your hospital stay. Once you are discharged, your primary care physician will handle any further medical issues. Please note that NO REFILLS for any discharge medications will be authorized once you are discharged, as it is imperative that you return to your primary care physician (or establish a relationship with a primary care physician if you do not have one) for your aftercare needs so that they can reassess your need for medications and monitor your lab values.    Medication List    TAKE these medications        acetaminophen 500 MG tablet  Commonly known as:  TYLENOL  Take 500 mg by mouth at bedtime as needed for moderate pain.     amiodarone 200 MG tablet  Commonly known as:  PACERONE  Take 200 mg by mouth 2 (two) times daily.     aspirin 81 MG tablet  Take 81 mg by mouth daily.     bacitracin 500 UNIT/GM ointment  Apply 1 application topically daily.     CALCIUM + D [phone] MG-UNT-MCG Chew  Generic drug:  Calcium-Vitamin D-Vitamin K  Chew 1 tablet by mouth 2 (two) times daily.     cholecalciferol 1000 units tablet  Commonly known as:  VITAMIN D  Take 1,000 Units by mouth daily.     ciprofloxacin 500 MG tablet  Commonly  known as:  CIPRO  Take 1 tablet (500 mg total) by mouth 2 (two) times daily.     clindamycin 300 MG capsule  Commonly known as:  CLEOCIN  Take 1 capsule (300 mg total) by mouth 3 (three) times daily.     CoQ10 100 MG Caps  Take 100 mg by mouth 2 (two) times daily.     DULoxetine 30 MG capsule  Commonly known as:  CYMBALTA  Take 30 mg by mouth daily.     ELIQUIS 5 MG Tabs tablet  Generic drug:  apixaban  Take 5 mg by mouth 2 (two) times daily.     ENTRESTO 24-26 MG  Generic drug:  sacubitril-valsartan  Take 1 tablet by mouth 2 (two) times daily.     Fish Oil 1000 MG Caps  Take 1,000 mg by mouth 2 (two) times daily.     furosemide 40 MG tablet  Commonly known as:  LASIX  Take 40 mg by mouth daily.     lactobacillus acidophilus & bulgar chewable tablet  Chew 1 tablet by mouth at bedtime.     LECITHIN PO  Take 2 tablets  by mouth 2 (two) times daily.     metoprolol tartrate 25 MG tablet  Commonly known as:  LOPRESSOR  Take 25 mg by mouth 2 (two) times daily.     multivitamin tablet  Take 1 tablet by mouth daily.     Red Yeast Rice 600 MG Caps  Take 600 mg by mouth 2 (two) times daily.     spironolactone 25 MG tablet  Commonly known as:  ALDACTONE  Take 12.5 mg by mouth daily.     TESSALON PERLES 100 MG capsule  Generic drug:  benzonatate  Take 100 mg by mouth 3 (three) times daily as needed for cough.     Vitamin C/Bioflavonoids 1000-25 MG Tabs  Take 1 tablet by mouth daily.     vitamin E 400 UNIT capsule  Take 400 Units by mouth daily.           Follow-up Information    Follow up with Bensimhon, Reuel Boom, MD. Call in 2 days.   Specialty:  Cardiology   Why:  Heart failure Clinic   Contact information:   [address] [place] [address] [phone]       Follow up with [facility]. Call in 2 days.   Contact information:   [address] [place] [address] [phone]      Follow up with  Kelly Lew, FNP In 1 week.   Specialty:  Nurse Practitioner   Why:  to re-evaluate cellulitis prior to stopping antibiotics   Contact information:   [address] [place] [address] [phone]       The results of significant diagnostics from this hospitalization (including imaging, microbiology, ancillary and laboratory) are listed below for reference.    Significant Diagnostic Studies: US Venous Img Lower Unilateral Left  11/25/2015  CLINICAL DATA:  History of lower leg swelling, redness, tenderness for 2 weeks. Status post abrasion on the calf. Pain, edema, injury, color changes. History of atrial fibrillation and CHF. EXAM: LEFT LOWER EXTREMITY VENOUS DOPPLER ULTRASOUND TECHNIQUE: Gray-scale sonography with graded compression, as well as color Doppler and duplex ultrasound were performed to evaluate the lower extremity deep venous systems from the level of the common femoral vein and including the common femoral, femoral, profunda femoral, popliteal and calf veins including the posterior tibial, peroneal and gastrocnemius veins when visible. The superficial great saphenous vein was also interrogated. Spectral Doppler was utilized to evaluate flow at rest and with distal augmentation maneuvers in the common femoral, femoral and popliteal veins. COMPARISON:  None. FINDINGS: Contralateral Common Femoral Vein: Respiratory phasicity is normal and symmetric with the symptomatic side. No evidence of thrombus. Normal compressibility. Common Femoral Vein: No evidence of thrombus. Normal compressibility, respiratory phasicity and response to augmentation. Saphenofemoral Junction: No evidence of thrombus. Normal compressibility and flow on color Doppler imaging. Profunda Femoral Vein: No evidence of thrombus. Normal compressibility and flow on color Doppler imaging. Femoral Vein: No evidence of thrombus. Normal compressibility, respiratory phasicity and response to augmentation.  Popliteal Vein: No evidence of thrombus. Normal compressibility, respiratory phasicity and response to augmentation. Calf Veins: Limited visualization Superficial Great Saphenous Vein: Create left greater saphenous vein from the upper thigh to the knee is noncompressible. Only partial flow is seen in the upper thigh of the greater saphenous vein. Echogenic material is identified within the greater saphenous vein in the upper thigh, consistent with nonocclusive thrombus. Venous Reflux:  None. Other Findings:  Subcutaneous edema identified in the lower leg.  IMPRESSION: 1. No evidence for occlusive deep vein thrombosis in the left lower extremity. 2. Nonocclusive thrombus identified within the left greater saphenous vein. 3. Lower extremity edema. Electronically Signed   By: Norva Pavlov M.D.   On: 11/25/2015 14:59    Microbiology: Recent Results (from the past 240 hour(s))  Culture, blood (Routine X 2) w Reflex to ID Panel     Status: None   Collection Time: 11/25/15  1:44 PM  Result Value Ref Range Status   Specimen Description BLOOD RIGHT ANTECUBITAL  Final   Special Requests BOTTLES DRAWN AEROBIC AND ANAEROBIC 5CC  Final   Culture   Final    NO GROWTH 5 DAYS Performed at [facility]    Report Status 11/30/2015 FINAL  Final  Culture, blood (Routine X 2) w Reflex to ID Panel     Status: None   Collection Time: 11/25/15  1:50 PM  Result Value Ref Range Status   Specimen Description BLOOD LEFT ANTECUBITAL  Final   Special Requests BOTTLES DRAWN AEROBIC AND ANAEROBIC  5CC  Final   Culture   Final    NO GROWTH 5 DAYS Performed at [facility]    Report Status 11/30/2015 FINAL  Final     Labs: Basic Metabolic Panel:  Recent Labs Lab 11/25/15 1345 11/26/15 0145 11/27/15 0715 11/28/15 0543  NA 139 142 140 140  K 3.0* 4.1 3.0* 4.4  CL 101 102 103 100*  CO2 31 30 27 29   GLUCOSE 126* 131* 101* 102*  BUN 18 14 14 16   CREATININE 0.71 0.83 0.77 0.83  CALCIUM 8.8* 8.8*  8.4* 8.9  MG  --  2.0  --   --    Liver Function Tests:  Recent Labs Lab 11/25/15 1345 11/26/15 0145  AST 24 19  ALT 36 29  ALKPHOS 76 75  BILITOT 0.6 1.0  PROT 6.2* 5.4*  ALBUMIN 3.4* 2.7*   CBC:  Recent Labs Lab 11/25/15 1345 11/26/15 0145 11/27/15 0715 11/28/15 0543  WBC 10.5 8.0 5.7 6.9  NEUTROABS 9.3*  --   --   --   HGB 11.1* 10.1* 10.3* 11.6*  HCT 35.1* 32.0* 31.7* 36.6  MCV 98.0 97.6 98.1 98.1  PLT 138* 128* 144* 195   BNP: BNP (last 3 results)  Recent Labs  11/25/15 2234  BNP 854.0*     Signed:  GHERGHE, COSTIN  [facility] 11/30/2015, 1:16 PM

## 2015-11-30 NOTE — Discharge Instructions (Signed)
Follow with FEDZIUK, BERNADETTE H, FNP in 5-7 days to re-evaluate cellulitis  Please get a complete blood count and chemistry panel checked by your Primary MD at your next visit, and again as instructed by your Primary MD. Please get your medications reviewed and adjusted by your Primary MD.  Please request your Primary MD to go over all Hospital Tests and Procedure/Radiological results at the follow up, please get all Hospital records sent to your Prim MD by signing hospital release before you go home.  If you had Pneumonia of Lung problems at the Hospital: Please get a 2 view Chest X ray done in 6-8 weeks after hospital discharge or sooner if instructed by your Primary MD.  If you have Congestive Heart Failure: Please call your Cardiologist or Primary MD anytime you have any of the following symptoms:  1) 3 pound weight gain in 24 hours or 5 pounds in 1 week  2) shortness of breath, with or without a dry hacking cough  3) swelling in the hands, feet or stomach  4) if you have to sleep on extra pillows at night in order to breathe  Follow cardiac low salt diet and 1.5 lit/day fluid restriction.  If you have diabetes Accuchecks 4 times/day, Once in AM empty stomach and then before each meal. Log in all results and show them to your primary doctor at your next visit. If any glucose reading is under 80 or above 300 call your primary MD immediately.  If you have Seizure/Convulsions/Epilepsy: Please do not drive, operate heavy machinery, participate in activities at heights or participate in high speed sports until you have seen by Primary MD or a Neurologist and advised to do so again.  If you had Gastrointestinal Bleeding: Please ask your Primary MD to check a complete blood count within one week of discharge or at your next visit. Your endoscopic/colonoscopic biopsies that are pending at the time of discharge, will also need to followed by your Primary MD.  Get Medicines reviewed and  adjusted. Please take all your medications with you for your next visit with your Primary MD  Please request your Primary MD to go over all hospital tests and procedure/radiological results at the follow up, please ask your Primary MD to get all Hospital records sent to his/her office.  If you experience worsening of your admission symptoms, develop shortness of breath, life threatening emergency, suicidal or homicidal thoughts you must seek medical attention immediately by calling 911 or calling your MD immediately  if symptoms less severe.  You must read complete instructions/literature along with all the possible adverse reactions/side effects for all the Medicines you take and that have been prescribed to you. Take any new Medicines after you have completely understood and accpet all the possible adverse reactions/side effects.   Do not drive or operate heavy machinery when taking Pain medications.   Do not take more than prescribed Pain, Sleep and Anxiety Medications  Special Instructions: If you have smoked or chewed Tobacco  in the last 2 yrs please stop smoking, stop any regular Alcohol  and or any Recreational drug use.  Wear Seat belts while driving.  Please note You were cared for by a hospitalist during your hospital stay. If you have any questions about your discharge medications or the care you received while you were in the hospital after you are discharged, you can call the unit and asked to speak with the hospitalist on call if the hospitalist that took care of you  is not available. Once you are discharged, your primary care physician will handle any further medical issues. Please note that NO REFILLS for any discharge medications will be authorized once you are discharged, as it is imperative that you return to your primary care physician (or establish a relationship with a primary care physician if you do not have one) for your aftercare needs so that they can reassess your need  for medications and monitor your lab values.  You can reach the hospitalist office at phone [phone] or fax [phone]   If you do not have a primary care physician, you can call [phone] for a physician referral.  Activity: As tolerated with Full fall precautions use walker/cane & assistance as needed  Diet: heart healthy  Disposition Home

## 2015-12-06 ENCOUNTER — Encounter: Payer: Self-pay | Admitting: Vascular Surgery

## 2015-12-11 ENCOUNTER — Encounter: Payer: Self-pay | Admitting: Vascular Surgery

## 2015-12-11 ENCOUNTER — Ambulatory Visit (INDEPENDENT_AMBULATORY_CARE_PROVIDER_SITE_OTHER): Payer: Medicare Other | Admitting: Vascular Surgery

## 2015-12-11 VITALS — BP 144/82 | HR 66 | Ht 64.0 in | Wt 205.0 lb

## 2015-12-11 DIAGNOSIS — G609 Hereditary and idiopathic neuropathy, unspecified: Secondary | ICD-10-CM

## 2015-12-11 DIAGNOSIS — I809 Phlebitis and thrombophlebitis of unspecified site: Secondary | ICD-10-CM

## 2015-12-11 LAB — VITAMIN B12: VITAMIN B 12: 315 pg/mL (ref 200–1100)

## 2015-12-11 NOTE — Progress Notes (Signed)
[facility] [place] HISTORY AND PHYSICAL   Referring Physician: Dr Pamella Pert History of Present Illness:  Patient is a 70 y.o. female who presents for evaluation of numbness and tingling in her feet left greater than right. She feels like she is wearing socks on her feet at all times. This has been present for 8 months. She also recently had a cellulitis in her left lower extremity. As part of the workup for that she was noted to have a thrombosed left greater saphenous vein and superficial thrombophlebitis. She denies any claudication symptoms although she does not walk very long distances. She has a history of congestive heart failure and is followed by Dr. Gala Romney for this. She denies rest pain. She has no history of nonhealing wounds.  Other medical problems include atrial fibrillation, hypertension, pulmonary hypertension, and hyperlipidemia all of which are currently stable. She is on Elavil plus for her atrial fibrillation. She was on Ellik was prior to the time of her left greater saphenous vein thrombosis.  Past Medical History  Diagnosis Date  . Panic attack   . A-fib (HCC)   . CHF (congestive heart failure) (HCC)   . Essential hypertension   . MR (mitral regurgitation)   . TR (tricuspid regurgitation)   . Pulmonary hypertension (HCC)   . HLD (hyperlipidemia)    past surgical history: Gastric bypass, ventral hernia repair  Past Surgical History  Procedure Laterality Date  . Replacement total knee      Social History Social History  Substance Use Topics  . Smoking status: Never Smoker   . Smokeless tobacco: None  . Alcohol Use: No    Family History Family History  Problem Relation Age of Onset  . Breast cancer Mother   . Hypertension Sister     Allergies  No Known Allergies   Current Outpatient Prescriptions  Medication Sig Dispense Refill  . acetaminophen (TYLENOL) 500 MG tablet Take 500 mg by mouth at bedtime as needed for moderate  pain.     [name] amiodarone (PACERONE) 200 MG tablet Take 200 mg by mouth 2 (two) times daily.    [name] apixaban (ELIQUIS) 5 MG TABS tablet Take 5 mg by mouth 2 (two) times daily.    [name] aspirin 81 MG tablet Take 81 mg by mouth daily.    . bacitracin 500 UNIT/GM ointment Apply 1 application topically daily. 15 g 0  . benzonatate (TESSALON PERLES) 100 MG capsule Take 100 mg by mouth 3 (three) times daily as needed for cough.     . Bioflavonoid Products (VITAMIN C/BIOFLAVONOIDS) 1000-25 MG TABS Take 1 tablet by mouth daily.    . Calcium-Vitamin D-Vitamin K (CALCIUM + D) [phone] MG-UNT-MCG CHEW Chew 1 tablet by mouth 2 (two) times daily.    . cholecalciferol (VITAMIN D) 1000 units tablet Take 1,000 Units by mouth daily.    . clindamycin (CLEOCIN) 300 MG capsule Take 1 capsule (300 mg total) by mouth 3 (three) times daily. 21 capsule 0  . Coenzyme Q10 (COQ10) 100 MG CAPS Take 100 mg by mouth 2 (two) times daily.    . DULoxetine (CYMBALTA) 30 MG capsule Take 30 mg by mouth daily.    . furosemide (LASIX) 40 MG tablet Take 40 mg by mouth daily.    [name] lactobacillus acidophilus & bulgar (LACTINEX) chewable tablet Chew 1 tablet by mouth at bedtime.    [name] LECITHIN PO Take 2 tablets by mouth 2 (two) times daily.    . metoprolol tartrate (  LOPRESSOR) 25 MG tablet Take 25 mg by mouth 2 (two) times daily.    . Multiple Vitamin (MULTIVITAMIN) tablet Take 1 tablet by mouth daily.    . Omega-3 Fatty Acids (FISH OIL) 1000 MG CAPS Take 1,000 mg by mouth 2 (two) times daily.    . Red Yeast Rice 600 MG CAPS Take 600 mg by mouth 2 (two) times daily.    . sacubitril-valsartan (ENTRESTO) 24-26 MG Take 1 tablet by mouth 2 (two) times daily.    [name] spironolactone (ALDACTONE) 25 MG tablet Take 12.5 mg by mouth daily.    . vitamin E 400 UNIT capsule Take 400 Units by mouth daily.    . ciprofloxacin (CIPRO) 500 MG tablet Take 1 tablet (500 mg total) by mouth 2 (two) times daily. (Patient not taking: Reported on 12/11/2015) 14 tablet 0    No current facility-administered medications for this visit.    ROS:   General:  No weight loss, Fever, chills  HEENT: No recent headaches, no nasal bleeding, no visual changes, no sore throat  Neurologic: No dizziness, blackouts, seizures. No recent symptoms of stroke or mini- stroke. No recent episodes of slurred speech, or temporary blindness.  Cardiac: No recent episodes of chest pain/pressure, no shortness of breath at rest.  + shortness of breath with exertion.  + history of atrial fibrillation or irregular heartbeat  Vascular: No history of rest pain in feet.  No history of claudication.  No history of non-healing ulcer, No history of DVT   Pulmonary: No home oxygen, no productive cough, no hemoptysis,  No asthma or wheezing  Musculoskeletal:  [ x] Arthritis, [ ]  Low back pain,  [ ]  Joint pain  Hematologic:No history of hypercoagulable state.  No history of easy bleeding.  No history of anemia  Gastrointestinal: No hematochezia or melena,  No gastroesophageal reflux, no trouble swallowing  Urinary: [ ]  chronic Kidney disease, [ ]  on HD - [ ]  MWF or [ ]  TTHS, [ ]  Burning with urination, [ ]  Frequent urination, [ ]  Difficulty urinating;   Skin: No rashes  Psychological: + history of anxiety,  No history of depression   Physical Examination  Filed Vitals:   12/11/15 1440 12/11/15 1450  BP: 141/85 144/82  Pulse: 66   Height: 5\' 4"  (1.626 m)   Weight: 205 lb (92.987 kg)   SpO2: 99%     Body mass index is 35.17 kg/(m^2).  General:  Alert and oriented, no acute distress HEENT: Normal Neck: No bruit or JVD Pulmonary: Clear to auscultation bilaterally Cardiac: Regular Rate and Rhythm without 3/6 systolic murmur Abdomen: Soft, non-tender, non-distended, no mass, no scars Skin: No rash, toes dusky bilaterally Extremity Pulses:  2+ radial, brachial, femoral, dorsalis pedis, posterior tibial pulses bilaterally Musculoskeletal: No deformity trace edema  Neurologic:  Upper and lower extremity motor 5/5 and symmetric  DATA:  Reviewed patient's duplex scan dated 04/24/2016 which showed superficial thrombophlebitis of the left greater saphenous no evidence of DVT bilaterally  ASSESSMENT:  Superficial thrombophlebitis left leg now resolved most likely this was secondary to the cellulitis from an infection in her left leg. She is currently on Ellik was which should reduce risk of propagation of this and creation of secondary DVT. She has palpable pulses in her feet and no evidence of arterial occlusive disease. The duskiness of her toes is most likely secondary to her pulmonary hypertension and overall cardiac dysfunction. Her numbness and tingling in her feet is most likely secondary to peripheral neuropathy.  I'm not sure of the etiology of this. However she has had gastric bypass and sometimes these patients can become vitamin B12 deficient over time.   PLAN: #1 check vitamin B12 level #2 follow-up with me on an as-needed basis. She has follow-up scheduled in the near future with Dr. Gala Romney.  Fabienne Bruns, MD [facility] [place] Office: [phone] Pager: [phone]

## 2015-12-12 LAB — VITAMIN B1

## 2015-12-19 ENCOUNTER — Other Ambulatory Visit (HOSPITAL_COMMUNITY): Payer: Self-pay | Admitting: *Deleted

## 2015-12-19 ENCOUNTER — Telehealth (HOSPITAL_COMMUNITY): Payer: Self-pay | Admitting: Vascular Surgery

## 2015-12-19 ENCOUNTER — Encounter (HOSPITAL_COMMUNITY): Payer: Self-pay | Admitting: Internal Medicine

## 2015-12-19 ENCOUNTER — Encounter (HOSPITAL_COMMUNITY): Payer: Self-pay | Admitting: *Deleted

## 2015-12-19 ENCOUNTER — Ambulatory Visit (HOSPITAL_COMMUNITY)
Admission: RE | Admit: 2015-12-19 | Discharge: 2015-12-19 | Disposition: A | Discharge status: Home / Self Care | Payer: Medicare Other | Source: Ambulatory Visit | Attending: Internal Medicine | Admitting: Internal Medicine

## 2015-12-19 VITALS — BP 104/60 | HR 83 | Wt 202.2 lb

## 2015-12-19 DIAGNOSIS — I4891 Unspecified atrial fibrillation: Secondary | ICD-10-CM | POA: Diagnosis not present

## 2015-12-19 DIAGNOSIS — I428 Other cardiomyopathies: Secondary | ICD-10-CM | POA: Insufficient documentation

## 2015-12-19 DIAGNOSIS — Z8249 Family history of ischemic heart disease and other diseases of the circulatory system: Secondary | ICD-10-CM | POA: Diagnosis not present

## 2015-12-19 DIAGNOSIS — I447 Left bundle-branch block, unspecified: Secondary | ICD-10-CM | POA: Insufficient documentation

## 2015-12-19 DIAGNOSIS — R0683 Snoring: Secondary | ICD-10-CM | POA: Insufficient documentation

## 2015-12-19 DIAGNOSIS — I081 Rheumatic disorders of both mitral and tricuspid valves: Secondary | ICD-10-CM | POA: Insufficient documentation

## 2015-12-19 DIAGNOSIS — Z7982 Long term (current) use of aspirin: Secondary | ICD-10-CM | POA: Insufficient documentation

## 2015-12-19 DIAGNOSIS — I482 Chronic atrial fibrillation: Secondary | ICD-10-CM | POA: Diagnosis not present

## 2015-12-19 DIAGNOSIS — E785 Hyperlipidemia, unspecified: Secondary | ICD-10-CM | POA: Diagnosis not present

## 2015-12-19 DIAGNOSIS — I272 Other secondary pulmonary hypertension: Secondary | ICD-10-CM | POA: Insufficient documentation

## 2015-12-19 DIAGNOSIS — I110 Hypertensive heart disease with heart failure: Secondary | ICD-10-CM | POA: Insufficient documentation

## 2015-12-19 DIAGNOSIS — I2510 Atherosclerotic heart disease of native coronary artery without angina pectoris: Secondary | ICD-10-CM | POA: Diagnosis not present

## 2015-12-19 DIAGNOSIS — Z803 Family history of malignant neoplasm of breast: Secondary | ICD-10-CM | POA: Diagnosis not present

## 2015-12-19 DIAGNOSIS — Z79899 Other long term (current) drug therapy: Secondary | ICD-10-CM | POA: Diagnosis not present

## 2015-12-19 DIAGNOSIS — Z7902 Long term (current) use of antithrombotics/antiplatelets: Secondary | ICD-10-CM | POA: Insufficient documentation

## 2015-12-19 DIAGNOSIS — I5022 Chronic systolic (congestive) heart failure: Secondary | ICD-10-CM | POA: Insufficient documentation

## 2015-12-19 LAB — COMPREHENSIVE METABOLIC PANEL
ALT: 22 U/L (ref 14–54)
AST: 21 U/L (ref 15–41)
Albumin: 4 g/dL (ref 3.5–5.0)
Alkaline Phosphatase: 67 U/L (ref 38–126)
Anion gap: 10 (ref 5–15)
BILIRUBIN TOTAL: 0.7 mg/dL (ref 0.3–1.2)
BUN: 24 mg/dL — AB (ref 6–20)
CALCIUM: 9.7 mg/dL (ref 8.9–10.3)
CO2: 28 mmol/L (ref 22–32)
CREATININE: 0.98 mg/dL (ref 0.44–1.00)
Chloride: 105 mmol/L (ref 101–111)
GFR calc Af Amer: >60 mL/min (ref >60)
GFR, EST NON AFRICAN AMERICAN: 57 mL/min — AB (ref >60)
Glucose, Bld: 113 mg/dL — ABNORMAL HIGH (ref 65–99)
Potassium: 4.5 mmol/L (ref 3.5–5.1)
Sodium: 143 mmol/L (ref 135–145)
TOTAL PROTEIN: 7.2 g/dL (ref 6.5–8.1)

## 2015-12-19 LAB — T4, FREE: Free T4: 1.09 ng/dL (ref 0.61–1.12)

## 2015-12-19 LAB — TSH: TSH: 4.474 u[IU]/mL (ref 0.350–4.500)

## 2015-12-19 MED ORDER — CARVEDILOL 6.25 MG PO TABS: 6.2500 mg | ORAL_TABLET | Freq: Two times a day (BID) | ORAL | Status: DC

## 2015-12-19 NOTE — Telephone Encounter (Signed)
Left pt detailed message .[name] Gave pt echo appt 12/31/15 @ 2:00, sleep study 02/10/16 and 3 week f/u 01/09/16 @ 11:00

## 2015-12-19 NOTE — Patient Instructions (Signed)
Stop Metoprolol  Start Carvedilol 6.25 mg Twice daily   Labs today  Your physician has requested that you have an echocardiogram. Echocardiography is a painless test that uses sound waves to create images of your heart. It provides your doctor with information about the size and shape of your heart and how well your heart's chambers and valves are working. This procedure takes approximately one hour. There are no restrictions for this procedure.  Your physician has recommended that you have a sleep study. This test records several body functions during sleep, including: brain activity, eye movement, oxygen and carbon dioxide blood levels, heart rate and rhythm, breathing rate and rhythm, the flow of air through your mouth and nose, snoring, body muscle movements, and chest and belly movement.  Your physician has recommended that you have a Cardioversion (DCCV). Electrical Cardioversion uses a jolt of electricity to your heart either through paddles or wired patches attached to your chest. This is a controlled, usually prescheduled, procedure. Defibrillation is done under light anesthesia in the hospital, and you usually go home the day of the procedure. This is done to get your heart back into a normal rhythm. You are not awake for the procedure. Please see the instruction sheet given to you today.  Your physician recommends that you schedule a follow-up appointment in: 3 weeks

## 2015-12-19 NOTE — Progress Notes (Signed)
Patient ID: Kelly Dickson, female   DOB: Mar 12, 1946, 70 y.o.   MRN: [id number]    Advanced Heart Failure Clinic Note   Referring Physician: Dr Willeen Cass Primary Care: Dr Luiz Iron Primary Cardiologist: Dr Willeen Cass  HPI:  Kelly Dickson is a 70 y.o. female with Mod MR, Afib, HTN, and chronic systolic HF who presents to establish in HF clinic.   Problems started last year while taking care of her chronically ill son, who passed away this 12-05-2022. Has been unable to walk long distances for awhile. Summer of 2016 was having episodes of SOB, weakness, and diaphoresis that led to full cardiac work up as below.    She presents today for establish in the HF clinic. Overall feels pretty good currently. Feels like she was doing better until her son died in 2022/12/05. Was cleared in October to go back to exercise. Can now walk around the mall without DOE. Can do steps OK if she takes her time (up a flight without having to stop.) Hasn't really tried an incline. Thinks she drinks less than 2 L. Doesn't add salt, tries to stay under 2g, Daughter thinks she does pretty well. Eats frozen vegetables and Mrs. Dash. Weight at home 195-199. Weight has been trending down with increased activity and better fluid management. She denies palpitations. She does snore.  No CP, no lightheadedness, no dizziness.   Eliquis started in June, has not missed any doses. HF meds started in August after heart cath.   LHC 06/27/15 - Dr Rolly Salter Willeen Cass - Right dominant anatomy - Left main - medium size and normal - LAD - medium caliber, 20% lesion in proximal portion - LCx - medium caliber, with few obtuse marginal branches but normal - RCA - large and dominant artery, without signifncat disease.   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 EF ~ 36%.  Review  of Systems: [y] = yes, [ ]  = no   General: Weight gain [ ] ; Weight loss [ ] ; Anorexia [ ] ; Fatigue [y]; Fever [ ] ; Chills [ ] ; Weakness [ ]   Cardiac: Chest pain/pressure [ ] ; Resting SOB [ ] ; Exertional SOB [y]; Orthopnea [ ] ; Pedal Edema [y]; Palpitations [ ] ; Syncope [ ] ; Presyncope [ ] ; Paroxysmal nocturnal dyspnea[ ]   Pulmonary: Cough [ ] ; Wheezing[ ] ; Hemoptysis[ ] ; Sputum [ ] ; Snoring [y]  GI: Vomiting[ ] ; Dysphagia[ ] ; Melena[ ] ; Hematochezia [ ] ; Heartburn[ ] ; Abdominal pain [ ] ; Constipation [ ] ; Diarrhea [ ] ; BRBPR [ ]   GU: Hematuria[ ] ; Dysuria [ ] ; Nocturia[ ]   Vascular: Pain in legs with walking [ ] ; Pain in feet with lying flat [ ] ; Non-healing sores [ ] ; Stroke [ ] ; TIA [ ] ; Slurred speech [ ] ;  Neuro: Headaches[ ] ; Vertigo[ ] ; Seizures[ ] ; Paresthesias[ ] ;Blurred vision [ ] ; Diplopia [ ] ; Vision changes [ ]   Ortho/Skin: Arthritis [y]; Joint pain [y]; Muscle pain [ ] ; Joint swelling [ ] ; Back Pain [ ] ; Rash [ ]   Psych: Depression[ ] ; Anxiety[ ]   Heme: Bleeding problems [ ] ; Clotting disorders [ ] ; Anemia [ ]   Endocrine: Diabetes [ ] ; Thyroid dysfunction[ ]    Past Medical History  Diagnosis Date  . Panic attack   . A-fib (HCC)   . CHF (congestive heart failure) (HCC)   . Essential  hypertension   . MR (mitral regurgitation)   . TR (tricuspid regurgitation)   . Pulmonary hypertension (HCC)   . HLD (hyperlipidemia)     Current Outpatient Prescriptions  Medication Sig Dispense Refill  . acetaminophen (TYLENOL) 500 MG tablet Take 500 mg by mouth at bedtime as needed for moderate pain.     [name] amiodarone (PACERONE) 200 MG tablet Take 200 mg by mouth 2 (two) times daily.    [name] apixaban (ELIQUIS) 5 MG TABS tablet Take 5 mg by mouth 2 (two) times daily.    [name] aspirin 81 MG tablet Take 81 mg by mouth daily.    . bacitracin 500 UNIT/GM ointment Apply 1 application topically daily. 15 g 0  . Bioflavonoid Products (VITAMIN C/BIOFLAVONOIDS) 1000-25 MG TABS Take 1 tablet by mouth daily.     . Calcium-Vitamin D-Vitamin K (CALCIUM + D) [phone] MG-UNT-MCG CHEW Chew 1 tablet by mouth 2 (two) times daily.    . cholecalciferol (VITAMIN D) 1000 units tablet Take 1,000 Units by mouth daily.    . Coenzyme Q10 (COQ10) 100 MG CAPS Take 100 mg by mouth 2 (two) times daily.    . DULoxetine (CYMBALTA) 30 MG capsule Take 30 mg by mouth daily.    . furosemide (LASIX) 40 MG tablet Take 40 mg by mouth daily.    [name] lactobacillus acidophilus & bulgar (LACTINEX) chewable tablet Chew 1 tablet by mouth at bedtime.    [name] LECITHIN PO Take 2 tablets by mouth 2 (two) times daily.    . metoprolol tartrate (LOPRESSOR) 25 MG tablet Take 25 mg by mouth 2 (two) times daily.    . Multiple Vitamin (MULTIVITAMIN) tablet Take 1 tablet by mouth daily.    . Omega-3 Fatty Acids (FISH OIL) 1000 MG CAPS Take 1,000 mg by mouth 2 (two) times daily.    . Red Yeast Rice 600 MG CAPS Take 600 mg by mouth 2 (two) times daily.    . sacubitril-valsartan (ENTRESTO) 24-26 MG Take 1 tablet by mouth 2 (two) times daily.    [name] spironolactone (ALDACTONE) 25 MG tablet Take 12.5 mg by mouth daily.    . vitamin E 400 UNIT capsule Take 400 Units by mouth daily.    . benzonatate (TESSALON PERLES) 100 MG capsule Take 100 mg by mouth 3 (three) times daily as needed for cough. Reported on 12/19/2015     No current facility-administered medications for this encounter.    No Known Allergies    Social History   Social History  . Marital Status: Divorced    Spouse Name: N/A  . Number of Children: N/A  . Years of Education: N/A   Occupational History  . Not on file.   Social History Main Topics  . Smoking status: Never Smoker   . Smokeless tobacco: Not on file  . Alcohol Use: No  . Drug Use: No  . Sexual Activity: Not on file   Other Topics Concern  . Not on file   Social History Narrative      Family History  Problem Relation Age of Onset  . Breast cancer Mother   . Hypertension Sister     Ceasar Mons Vitals:    12/19/15 1203  BP: 104/60  Pulse: 83  Weight: 202 lb 4 oz (91.74 kg)  SpO2: 99%    PHYSICAL EXAM: General:  Elderly appearing. No respiratory difficulty HEENT: normal Neck: supple. JVD 5-6. Carotids 2+ bilat; no bruits. No lymphadenopathy or thyromegaly appreciated. Cor: PMI nondisplaced. Irregularly irregular. Split  s2 No rubs, gallops or murmurs appreciated. Lungs: CTAB, normal effort Abdomen: soft, NT, ND, no HSM. No bruits or masses. +BS  Extremities: no cyanosis, clubbing, rash, edema. Compression stockings in place. Neuro: alert & oriented x 3, cranial nerves grossly intact. moves all 4 extremities w/o difficulty. Affect pleasant.  ECG: Afib 79 bpm with LBBB (chronic per daughter)  ASSESSMENT & PLAN:  1. Chronic systolic HF  2. Chronic Afib 3. Mod MR/Mod TR 4. Mod Pulmonary HTN - TR Max PG 55.5 mm Hg by echo  5. HLD 6. Snoring 7. LBBB,  Stop lopressor and start coreg 6.25 mg BID. Will see back on NP/PA side in 3 weeks. For med titration and rhythm check. CMET,  TSH, Free T4 today.  Will set up for DCCV next week with Dr. Gala Romney   Bedside Echo shows EF slightly improved to at least ~ 35% range. Will get formal echo.  ? LBBB may be contributing factor for NICM.   Refer for sleep study.   Casimiro Needle [address] [place], PA-C 12/19/2015 1:12 PM   Patient seen and examined with Otilio Saber, PA-C. We discussed all aspects of the encounter. I agree with the assessment and plan as stated above.   NYHA II-III. Volume status looks good. Suspect NICM due to OSA and/or LBBB. AF may also be contributing. Agree with switching lopressor to carvedilol. Will plan DC-CV next week and also check formal echo and sleep study. Continue Eliquis. If EF not improving would likely benefit from CRT-D given wide LBBB. Long talk about HF natural history and treatment with patient and her daughter.   Total time spent 45 minutes. Over half that time spent discussing above.   Delia Slatten,  Deontrey Massi,MD 8:45 PM

## 2015-12-22 DIAGNOSIS — I447 Left bundle-branch block, unspecified: Secondary | ICD-10-CM | POA: Insufficient documentation

## 2015-12-25 ENCOUNTER — Ambulatory Visit (HOSPITAL_COMMUNITY)
Admission: RE | Admit: 2015-12-25 | Discharge: 2015-12-25 | Disposition: A | Discharge status: Home / Self Care | Payer: Medicare Other | Source: Ambulatory Visit | Attending: Internal Medicine | Admitting: Internal Medicine

## 2015-12-25 ENCOUNTER — Encounter (HOSPITAL_COMMUNITY): Payer: Self-pay

## 2015-12-25 ENCOUNTER — Ambulatory Visit (HOSPITAL_COMMUNITY): Payer: Medicare Other | Admitting: Anesthesiology

## 2015-12-25 ENCOUNTER — Encounter (HOSPITAL_COMMUNITY)
Admission: RE | Disposition: A | Discharge status: Home / Self Care | Payer: Self-pay | Source: Ambulatory Visit | Attending: Internal Medicine

## 2015-12-25 DIAGNOSIS — I5022 Chronic systolic (congestive) heart failure: Secondary | ICD-10-CM | POA: Diagnosis not present

## 2015-12-25 DIAGNOSIS — I447 Left bundle-branch block, unspecified: Secondary | ICD-10-CM | POA: Insufficient documentation

## 2015-12-25 DIAGNOSIS — Z7982 Long term (current) use of aspirin: Secondary | ICD-10-CM | POA: Insufficient documentation

## 2015-12-25 DIAGNOSIS — G4733 Obstructive sleep apnea (adult) (pediatric): Secondary | ICD-10-CM | POA: Insufficient documentation

## 2015-12-25 DIAGNOSIS — Z7901 Long term (current) use of anticoagulants: Secondary | ICD-10-CM | POA: Insufficient documentation

## 2015-12-25 DIAGNOSIS — I482 Chronic atrial fibrillation: Secondary | ICD-10-CM | POA: Insufficient documentation

## 2015-12-25 DIAGNOSIS — I081 Rheumatic disorders of both mitral and tricuspid valves: Secondary | ICD-10-CM | POA: Insufficient documentation

## 2015-12-25 DIAGNOSIS — I480 Paroxysmal atrial fibrillation: Secondary | ICD-10-CM | POA: Insufficient documentation

## 2015-12-25 DIAGNOSIS — I110 Hypertensive heart disease with heart failure: Secondary | ICD-10-CM | POA: Diagnosis not present

## 2015-12-25 DIAGNOSIS — I272 Other secondary pulmonary hypertension: Secondary | ICD-10-CM | POA: Insufficient documentation

## 2015-12-25 DIAGNOSIS — I2510 Atherosclerotic heart disease of native coronary artery without angina pectoris: Secondary | ICD-10-CM | POA: Diagnosis not present

## 2015-12-25 DIAGNOSIS — I428 Other cardiomyopathies: Secondary | ICD-10-CM | POA: Insufficient documentation

## 2015-12-25 DIAGNOSIS — I4891 Unspecified atrial fibrillation: Secondary | ICD-10-CM | POA: Diagnosis present

## 2015-12-25 DIAGNOSIS — E785 Hyperlipidemia, unspecified: Secondary | ICD-10-CM | POA: Diagnosis not present

## 2015-12-25 DIAGNOSIS — Z79899 Other long term (current) drug therapy: Secondary | ICD-10-CM | POA: Insufficient documentation

## 2015-12-25 HISTORY — PX: CARDIOVERSION: SHX1299

## 2015-12-25 SURGERY — CARDIOVERSION
Anesthesia: General

## 2015-12-25 MED ORDER — SODIUM CHLORIDE 0.9 % IV SOLN
INTRAVENOUS | Status: DC | PRN
  Administered 2015-12-25: 13:00:00 via INTRAVENOUS

## 2015-12-25 MED ORDER — PROPOFOL 10 MG/ML IV BOLUS
INTRAVENOUS | Status: DC | PRN
  Administered 2015-12-25: 60 mg via INTRAVENOUS

## 2015-12-25 NOTE — Anesthesia Postprocedure Evaluation (Signed)
Anesthesia Post Note  Patient: Kelly Dickson  Procedure(s) Performed: Procedure(s) (LRB): CARDIOVERSION (N/A)  Patient location during evaluation: Endoscopy Anesthesia Type: MAC Level of consciousness: awake and alert and oriented Pain management: pain level controlled Vital Signs Assessment: post-procedure vital signs reviewed and stable Respiratory status: spontaneous breathing, nonlabored ventilation and respiratory function stable Cardiovascular status: blood pressure returned to baseline and stable Postop Assessment: no signs of nausea or vomiting Anesthetic complications: no    Last Vitals:  Filed Vitals:   12/25/15 1420 12/25/15 1430  BP: 131/58 132/60  Pulse: 49 49  Temp:    Resp: 20 15    Last Pain:  Filed Vitals:   12/25/15 1432  PainSc: 6                  Voyd Groft A.

## 2015-12-25 NOTE — Transfer of Care (Signed)
Immediate Anesthesia Transfer of Care Note  Patient: Kelly Dickson  Procedure(s) Performed: Procedure(s): CARDIOVERSION (N/A)  Patient Location: Endoscopy Unit  Anesthesia Type:General  Level of Consciousness: awake, alert  and oriented  Airway & Oxygen Therapy: Patient Spontanous Breathing and Patient connected to nasal cannula oxygen  Post-op Assessment: Report given to RN, Post -op Vital signs reviewed and stable and Patient moving all extremities X 4  Post vital signs: Reviewed and stable  Last Vitals:  Filed Vitals:   12/25/15 1235  BP: 129/56  Pulse: 67  Temp: 36.4 C  Resp: 23    Complications: No apparent anesthesia complications

## 2015-12-25 NOTE — Anesthesia Preprocedure Evaluation (Addendum)
Anesthesia Evaluation  Patient identified by MRN, date of birth, ID band Patient awake    Reviewed: Allergy & Precautions, NPO status , Patient's Chart, lab work & pertinent test results, reviewed documented beta blocker date and time   Airway Mallampati: II       Dental no notable dental hx. (+) Teeth Intact, Caps   Pulmonary neg pulmonary ROS,    Pulmonary exam normal breath sounds clear to auscultation       Cardiovascular hypertension, Pt. on medications and Pt. on home beta blockers +CHF  + dysrhythmias Atrial Fibrillation  Rhythm:Irregular Rate:Bradycardia  LBBB pattern LVEF 30%   Neuro/Psych Anxiety negative neurological ROS     GI/Hepatic negative GI ROS, Neg liver ROS,   Endo/Other  negative endocrine ROSHyperlipidemia  Renal/GU negative Renal ROS  negative genitourinary   Musculoskeletal negative musculoskeletal ROS (+)   Abdominal (+) + obese,   Peds  Hematology On Eliquis ( apixaban )   Anesthesia Other Findings   Reproductive/Obstetrics                            Anesthesia Physical Anesthesia Plan  ASA: III  Anesthesia Plan: General and MAC   Post-op Pain Management:    Induction: Intravenous  Airway Management Planned: Mask  Additional Equipment:   Intra-op Plan:   Post-operative Plan: Extubation in OR  Informed Consent: I have reviewed the patients History and Physical, chart, labs and discussed the procedure including the risks, benefits and alternatives for the proposed anesthesia with the patient or authorized representative who has indicated his/her understanding and acceptance.   Dental advisory given  Plan Discussed with: Anesthesiologist, CRNA and Surgeon  Anesthesia Plan Comments:        Anesthesia Quick Evaluation

## 2015-12-25 NOTE — Interval H&P Note (Signed)
History and Physical Interval Note:  12/25/2015 1:37 PM  Kelly Dickson  has presented today for surgery, with the diagnosis of AFIB  The various methods of treatment have been discussed with the patient and family. After consideration of risks, benefits and other options for treatment, the patient has consented to  Procedure(s): CARDIOVERSION (N/A) as a surgical intervention .  The patient's history has been reviewed, patient examined, no change in status, stable for surgery.  I have reviewed the patient's chart and labs.  Questions were answered to the patient's satisfaction.     Bensimhon, Reuel Boom

## 2015-12-25 NOTE — Anesthesia Procedure Notes (Signed)
Procedure Name: MAC Date/Time: 12/25/2015 1:39 PM Performed by: Quentin Ore Pre-anesthesia Checklist: Patient identified, Emergency Drugs available, Suction available, Patient being monitored and Timeout performed Patient Re-evaluated:Patient Re-evaluated prior to inductionOxygen Delivery Method: Ambu bag Preoxygenation: Pre-oxygenation with 100% oxygen Intubation Type: IV induction Ventilation: Mask ventilation without difficulty Dental Injury: Teeth and Oropharynx as per pre-operative assessment

## 2015-12-25 NOTE — H&P (View-Only) (Signed)
Patient ID: Kelly Dickson, female   DOB: Mar 12, 1946, 70 y.o.   MRN: [id number]    Advanced Heart Failure Clinic Note   Referring Physician: Dr Willeen Cass Primary Care: Dr Luiz Iron Primary Cardiologist: Dr Willeen Cass  HPI:  Kelly Dickson is a 70 y.o. female with Mod MR, Afib, HTN, and chronic systolic HF who presents to establish in HF clinic.   Problems started last year while taking care of her chronically ill son, who passed away this 12-05-2022. Has been unable to walk long distances for awhile. Summer of 2016 was having episodes of SOB, weakness, and diaphoresis that led to full cardiac work up as below.    She presents today for establish in the HF clinic. Overall feels pretty good currently. Feels like she was doing better until her son died in 2022/12/05. Was cleared in October to go back to exercise. Can now walk around the mall without DOE. Can do steps OK if she takes her time (up a flight without having to stop.) Hasn't really tried an incline. Thinks she drinks less than 2 L. Doesn't add salt, tries to stay under 2g, Daughter thinks she does pretty well. Eats frozen vegetables and Mrs. Dash. Weight at home 195-199. Weight has been trending down with increased activity and better fluid management. She denies palpitations. She does snore.  No CP, no lightheadedness, no dizziness.   Eliquis started in June, has not missed any doses. HF meds started in August after heart cath.   LHC 06/27/15 - Dr Rolly Salter Willeen Cass - Right dominant anatomy - Left main - medium size and normal - LAD - medium caliber, 20% lesion in proximal portion - LCx - medium caliber, with few obtuse marginal branches but normal - RCA - large and dominant artery, without signifncat disease.   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 EF ~ 36%.  Review  of Systems: [y] = yes, [ ]  = no   General: Weight gain [ ] ; Weight loss [ ] ; Anorexia [ ] ; Fatigue [y]; Fever [ ] ; Chills [ ] ; Weakness [ ]   Cardiac: Chest pain/pressure [ ] ; Resting SOB [ ] ; Exertional SOB [y]; Orthopnea [ ] ; Pedal Edema [y]; Palpitations [ ] ; Syncope [ ] ; Presyncope [ ] ; Paroxysmal nocturnal dyspnea[ ]   Pulmonary: Cough [ ] ; Wheezing[ ] ; Hemoptysis[ ] ; Sputum [ ] ; Snoring [y]  GI: Vomiting[ ] ; Dysphagia[ ] ; Melena[ ] ; Hematochezia [ ] ; Heartburn[ ] ; Abdominal pain [ ] ; Constipation [ ] ; Diarrhea [ ] ; BRBPR [ ]   GU: Hematuria[ ] ; Dysuria [ ] ; Nocturia[ ]   Vascular: Pain in legs with walking [ ] ; Pain in feet with lying flat [ ] ; Non-healing sores [ ] ; Stroke [ ] ; TIA [ ] ; Slurred speech [ ] ;  Neuro: Headaches[ ] ; Vertigo[ ] ; Seizures[ ] ; Paresthesias[ ] ;Blurred vision [ ] ; Diplopia [ ] ; Vision changes [ ]   Ortho/Skin: Arthritis [y]; Joint pain [y]; Muscle pain [ ] ; Joint swelling [ ] ; Back Pain [ ] ; Rash [ ]   Psych: Depression[ ] ; Anxiety[ ]   Heme: Bleeding problems [ ] ; Clotting disorders [ ] ; Anemia [ ]   Endocrine: Diabetes [ ] ; Thyroid dysfunction[ ]    Past Medical History  Diagnosis Date  . Panic attack   . A-fib (HCC)   . CHF (congestive heart failure) (HCC)   . Essential  hypertension   . MR (mitral regurgitation)   . TR (tricuspid regurgitation)   . Pulmonary hypertension (HCC)   . HLD (hyperlipidemia)     Current Outpatient Prescriptions  Medication Sig Dispense Refill  . acetaminophen (TYLENOL) 500 MG tablet Take 500 mg by mouth at bedtime as needed for moderate pain.     [name] amiodarone (PACERONE) 200 MG tablet Take 200 mg by mouth 2 (two) times daily.    [name] apixaban (ELIQUIS) 5 MG TABS tablet Take 5 mg by mouth 2 (two) times daily.    [name] aspirin 81 MG tablet Take 81 mg by mouth daily.    . bacitracin 500 UNIT/GM ointment Apply 1 application topically daily. 15 g 0  . Bioflavonoid Products (VITAMIN C/BIOFLAVONOIDS) 1000-25 MG TABS Take 1 tablet by mouth daily.     . Calcium-Vitamin D-Vitamin K (CALCIUM + D) [phone] MG-UNT-MCG CHEW Chew 1 tablet by mouth 2 (two) times daily.    . cholecalciferol (VITAMIN D) 1000 units tablet Take 1,000 Units by mouth daily.    . Coenzyme Q10 (COQ10) 100 MG CAPS Take 100 mg by mouth 2 (two) times daily.    . DULoxetine (CYMBALTA) 30 MG capsule Take 30 mg by mouth daily.    . furosemide (LASIX) 40 MG tablet Take 40 mg by mouth daily.    [name] lactobacillus acidophilus & bulgar (LACTINEX) chewable tablet Chew 1 tablet by mouth at bedtime.    [name] LECITHIN PO Take 2 tablets by mouth 2 (two) times daily.    . metoprolol tartrate (LOPRESSOR) 25 MG tablet Take 25 mg by mouth 2 (two) times daily.    . Multiple Vitamin (MULTIVITAMIN) tablet Take 1 tablet by mouth daily.    . Omega-3 Fatty Acids (FISH OIL) 1000 MG CAPS Take 1,000 mg by mouth 2 (two) times daily.    . Red Yeast Rice 600 MG CAPS Take 600 mg by mouth 2 (two) times daily.    . sacubitril-valsartan (ENTRESTO) 24-26 MG Take 1 tablet by mouth 2 (two) times daily.    [name] spironolactone (ALDACTONE) 25 MG tablet Take 12.5 mg by mouth daily.    . vitamin E 400 UNIT capsule Take 400 Units by mouth daily.    . benzonatate (TESSALON PERLES) 100 MG capsule Take 100 mg by mouth 3 (three) times daily as needed for cough. Reported on 12/19/2015     No current facility-administered medications for this encounter.    No Known Allergies    Social History   Social History  . Marital Status: Divorced    Spouse Name: N/A  . Number of Children: N/A  . Years of Education: N/A   Occupational History  . Not on file.   Social History Main Topics  . Smoking status: Never Smoker   . Smokeless tobacco: Not on file  . Alcohol Use: No  . Drug Use: No  . Sexual Activity: Not on file   Other Topics Concern  . Not on file   Social History Narrative      Family History  Problem Relation Age of Onset  . Breast cancer Mother   . Hypertension Sister     Ceasar Mons Vitals:    12/19/15 1203  BP: 104/60  Pulse: 83  Weight: 202 lb 4 oz (91.74 kg)  SpO2: 99%    PHYSICAL EXAM: General:  Elderly appearing. No respiratory difficulty HEENT: normal Neck: supple. JVD 5-6. Carotids 2+ bilat; no bruits. No lymphadenopathy or thyromegaly appreciated. Cor: PMI nondisplaced. Irregularly irregular. Split  s2 No rubs, gallops or murmurs appreciated. Lungs: CTAB, normal effort Abdomen: soft, NT, ND, no HSM. No bruits or masses. +BS  Extremities: no cyanosis, clubbing, rash, edema. Compression stockings in place. Neuro: alert & oriented x 3, cranial nerves grossly intact. moves all 4 extremities w/o difficulty. Affect pleasant.  ECG: Afib 79 bpm with LBBB (chronic per daughter)  ASSESSMENT & PLAN:  1. Chronic systolic HF  2. Chronic Afib 3. Mod MR/Mod TR 4. Mod Pulmonary HTN - TR Max PG 55.5 mm Hg by echo  5. HLD 6. Snoring 7. LBBB,  Stop lopressor and start coreg 6.25 mg BID. Will see back on NP/PA side in 3 weeks. For med titration and rhythm check. CMET,  TSH, Free T4 today.  Will set up for DCCV next week with Dr. Gala Romney   Bedside Echo shows EF slightly improved to at least ~ 35% range. Will get formal echo.  ? LBBB may be contributing factor for NICM.   Refer for sleep study.   Casimiro Needle [address] [place], PA-C 12/19/2015 1:12 PM   Patient seen and examined with Otilio Saber, PA-C. We discussed all aspects of the encounter. I agree with the assessment and plan as stated above.   NYHA II-III. Volume status looks good. Suspect NICM due to OSA and/or LBBB. AF may also be contributing. Agree with switching lopressor to carvedilol. Will plan DC-CV next week and also check formal echo and sleep study. Continue Eliquis. If EF not improving would likely benefit from CRT-D given wide LBBB. Long talk about HF natural history and treatment with patient and her daughter.   Total time spent 45 minutes. Over half that time spent discussing above.   Youlanda Tomassetti,  Thy Gullikson,MD 8:45 PM

## 2015-12-25 NOTE — CV Procedure (Signed)
     DIRECT CURRENT CARDIOVERSION  NAME:  Kelly Dickson   MRN: [id number] DOB:  February 24, 1946   ADMIT DATE: 12/25/2015   INDICATIONS: Atrial fibrillation    PROCEDURE:   Informed consent was obtained prior to the procedure. The risks, benefits and alternatives for the procedure were discussed and the patient comprehended these risks. Once an appropriate time out was taken, the patient had the defibrillator pads placed in the anterior and posterior position. The patient then underwent sedation by the anesthesia service. Once an appropriate level of sedation was achieved, the patient received a single biphasic, synchronized 200J shock with prompt conversion to sinus rhythm. No apparent complications.  Bensimhon, Daniel,MD 1:45 PM

## 2015-12-25 NOTE — Discharge Instructions (Signed)
Electrical Cardioversion, Care After °Refer to this sheet in the next few weeks. These instructions provide you with information on caring for yourself after your procedure. Your health care provider may also give you more specific instructions. Your treatment has been planned according to current medical practices, but problems sometimes occur. Call your health care provider if you have any problems or questions after your procedure. °WHAT TO EXPECT AFTER THE PROCEDURE °After your procedure, it is typical to have the following sensations: °· Some redness on the skin where the shocks were delivered. If this is tender, a sunburn lotion or hydrocortisone cream may help. °· Possible return of an abnormal heart rhythm within hours or days after the procedure. °HOME CARE INSTRUCTIONS °· Take medicines only as directed by your health care provider. Be sure you understand how and when to take your medicine. °· Learn how to feel your pulse and check it often. °· Limit your activity for 48 hours after the procedure or as directed by your health care provider. °· Avoid or minimize caffeine and other stimulants as directed by your health care provider. °SEEK MEDICAL CARE IF: °· You feel like your heart is beating too fast or your pulse is not regular. °· You have any questions about your medicines. °· You have bleeding that will not stop. °SEEK IMMEDIATE MEDICAL CARE IF: °· You are dizzy or feel faint. °· It is hard to breathe or you feel short of breath. °· There is a change in discomfort in your chest. °· Your speech is slurred or you have trouble moving an arm or leg on one side of your body. °· You get a serious muscle cramp that does not go away. °· Your fingers or toes turn cold or blue. °  °This information is not intended to replace advice given to you by your health care provider. Make sure you discuss any questions you have with your health care provider. °  °Document Released: 08/09/2013 Document Revised: 11/09/2014  Document Reviewed: 08/09/2013 °Elsevier Interactive Patient Education ©2016 [employer] ° °

## 2015-12-26 ENCOUNTER — Encounter (HOSPITAL_COMMUNITY): Payer: Self-pay | Admitting: Internal Medicine

## 2015-12-31 ENCOUNTER — Telehealth (HOSPITAL_COMMUNITY): Payer: Self-pay

## 2015-12-31 ENCOUNTER — Other Ambulatory Visit: Payer: Self-pay

## 2015-12-31 ENCOUNTER — Ambulatory Visit (HOSPITAL_COMMUNITY): Payer: Medicare Other | Attending: Cardiovascular Disease

## 2015-12-31 DIAGNOSIS — I5022 Chronic systolic (congestive) heart failure: Secondary | ICD-10-CM | POA: Diagnosis not present

## 2015-12-31 DIAGNOSIS — I352 Nonrheumatic aortic (valve) stenosis with insufficiency: Secondary | ICD-10-CM | POA: Insufficient documentation

## 2015-12-31 DIAGNOSIS — I340 Nonrheumatic mitral (valve) insufficiency: Secondary | ICD-10-CM | POA: Diagnosis not present

## 2015-12-31 DIAGNOSIS — I509 Heart failure, unspecified: Secondary | ICD-10-CM | POA: Diagnosis present

## 2015-12-31 DIAGNOSIS — E785 Hyperlipidemia, unspecified: Secondary | ICD-10-CM | POA: Insufficient documentation

## 2015-12-31 DIAGNOSIS — I110 Hypertensive heart disease with heart failure: Secondary | ICD-10-CM | POA: Diagnosis not present

## 2015-12-31 NOTE — Telephone Encounter (Signed)
Patient left VM on CHF traige line to get directions to her echo apt today. Will ask CHF clinic scheduler to direct patient.  Ave Filter

## 2016-01-06 ENCOUNTER — Telehealth (HOSPITAL_COMMUNITY): Payer: Self-pay

## 2016-01-06 ENCOUNTER — Telehealth (HOSPITAL_COMMUNITY): Payer: Self-pay | Admitting: Cardiology

## 2016-01-06 MED ORDER — APIXABAN 5 MG PO TABS: 5.0000 mg | ORAL_TABLET | Freq: Two times a day (BID) | ORAL | Status: DC

## 2016-01-06 MED ORDER — FUROSEMIDE 40 MG PO TABS: 40.0000 mg | ORAL_TABLET | Freq: Every day | ORAL | Status: DC

## 2016-01-06 NOTE — Telephone Encounter (Signed)
Patient called CHF clinic triage to clarify medication regimen. States she has been taking carvedilol once daily.  Advised patient this is a twice daily medication. Aware and appreciative.  Ave Filter

## 2016-01-06 NOTE — Telephone Encounter (Signed)
PATIENTS DAUGHTER CALLED TO REQUEST REFILLS,

## 2016-01-08 NOTE — Progress Notes (Signed)
Patient ID: Kelly Dickson, female   DOB: 06/17/1946, 70 y.o.   MRN: [id number]    Advanced Heart Failure Clinic Note   Referring Physician: Dr Willeen Cass Primary Care: Dr Luiz Iron Primary Cardiologist: Dr Willeen Cass Primary HF: Dr. Gala Romney   HPI:  Kelly Dickson is a 70 y.o. female with Mod MR, Afib, HTN, and chronic systolic HF who presents to establish in HF clinic.   Problems started last year while taking care of her chronically ill son, who passed away this 2022/12/04. Has been unable to walk long distances for awhile. Summer of 2016 was having episodes of SOB, weakness, and diaphoresis that led to full cardiac work up as below.    Echo 12/31/15 EF 45-50%, Severe LAE, Mild AS, Mild MR  She presents today for regular follow up. Had DCCV and Echo since last visit. EF improved from 25-30% in 05/2015   Was also switched from lopressor to Coreg.  Recently called for clarification, she had been taking coreg once daily. Is also still taking amiodarone 200 mg BID. Has been having some dizziness with standing rapidly, but not if she takes her time. Feels like breathing continues to improve. Can walk on flat ground as long as wants now.   She is watching fluid and salt. Weight has been stable at home. No palpitations. No CP.    LHC 06/27/15 - Dr Rolly Salter Willeen Cass - Right dominant anatomy - Left main - medium size and normal - LAD - medium caliber, 20% lesion in proximal portion - LCx - medium caliber, with few obtuse marginal branches but normal - RCA - large and dominant artery, without signifncat disease.   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 EF ~ 36%.  ROS: All systems reviewed and negative except as listed in HPI.   Past Medical History  Diagnosis Date  . Panic attack   . A-fib (HCC)   . CHF (congestive heart  failure) (HCC)   . Essential hypertension   . MR (mitral regurgitation)   . TR (tricuspid regurgitation)   . Pulmonary hypertension (HCC)   . HLD (hyperlipidemia)     Current Outpatient Prescriptions  Medication Sig Dispense Refill  . acetaminophen (TYLENOL) 500 MG tablet Take 500 mg by mouth at bedtime as needed for moderate pain.     [name] amiodarone (PACERONE) 200 MG tablet Take 200 mg by mouth 2 (two) times daily.    [name] apixaban (ELIQUIS) 5 MG TABS tablet Take 1 tablet (5 mg total) by mouth 2 (two) times daily. 60 tablet 6  . bacitracin 500 UNIT/GM ointment Apply 1 application topically daily. (Patient taking differently: Apply 1 application topically daily as needed for wound care. ) 15 g 0  . benzonatate (TESSALON PERLES) 100 MG capsule Take 100 mg by mouth daily as needed for cough. Reported on 12/19/2015    . Bioflavonoid Products (VITAMIN C/BIOFLAVONOIDS) 1000-25 MG TABS Take 1 tablet by mouth daily. Reported on 12/24/2015    . Calcium-Vitamin D-Vitamin K (CALCIUM + D) [phone] MG-UNT-MCG CHEW Chew 2 tablets by mouth 2 (two) times daily.     . carvedilol (COREG) 6.25 MG tablet Take 6.25 mg by mouth daily.    . cholecalciferol (VITAMIN D) 1000 units tablet Take 1,000 Units by mouth 2 (two) times daily.     [name]  Coenzyme Q10 (COQ10) 100 MG CAPS Take 100 mg by mouth 2 (two) times daily.    . DULoxetine (CYMBALTA) 30 MG capsule Take 30 mg by mouth daily.    . fluticasone (FLONASE) 50 MCG/ACT nasal spray Place 1 spray into both nostrils 2 (two) times daily as needed for allergies or rhinitis.    . furosemide (LASIX) 40 MG tablet Take 1 tablet (40 mg total) by mouth daily. 30 tablet 6  . lactobacillus acidophilus & bulgar (LACTINEX) chewable tablet Chew 1 tablet by mouth at bedtime.    [name] LECITHIN PO Take 1,200-2,400 mg by mouth 2 (two) times daily.     . Multiple Vitamins-Minerals (MULTIVITAMIN WITH MINERALS) tablet Take 1 tablet by mouth daily.    . Omega-3 Fatty Acids (FISH OIL) 1000 MG CAPS  Take 1,000 mg by mouth 2 (two) times daily.    . Red Yeast Rice 600 MG CAPS Take 600 mg by mouth 2 (two) times daily.    . sacubitril-valsartan (ENTRESTO) 24-26 MG Take 1 tablet by mouth 2 (two) times daily.    [name] spironolactone (ALDACTONE) 25 MG tablet Take 12.5 mg by mouth daily at 12 noon.     . vitamin E 400 UNIT capsule Take 400 Units by mouth daily as needed (for supplementation).      No current facility-administered medications for this encounter.    No Known Allergies    Social History   Social History  . Marital Status: Divorced    Spouse Name: N/A  . Number of Children: N/A  . Years of Education: N/A   Occupational History  . Not on file.   Social History Main Topics  . Smoking status: Never Smoker   . Smokeless tobacco: Not on file  . Alcohol Use: No  . Drug Use: No  . Sexual Activity: Not on file   Other Topics Concern  . Not on file   Social History Narrative      Family History  Problem Relation Age of Onset  . Breast cancer Mother   . Hypertension Sister     Ceasar Mons Vitals:   01/09/16 1104  BP: 134/52  Pulse: 51  Weight: 205 lb 12.8 oz (93.35 kg)  SpO2: 97%   Wt Readings from Last 3 Encounters:  01/09/16 205 lb 12.8 oz (93.35 kg)  12/25/15 198 lb (89.812 kg)  12/19/15 202 lb 4 oz (91.74 kg)    PHYSICAL EXAM: General:  Elderly appearing. No respiratory difficulty HEENT: normal Neck: supple. JVD 6-7. Carotids 2+ bilat; no bruits. No thyromegaly or nodule noted.  Cor: PMI nondisplaced. Regular, markedly bradycardic. Split s2 No rubs, gallops or murmurs appreciated. Lungs: CTAB, normal effort Abdomen: soft, NT, ND, no HSM. No bruits or masses. +BS  Extremities: no cyanosis, clubbing, rash. No edema. Compression stockings in place. Neuro: alert & oriented x 3, cranial nerves grossly intact. moves all 4 extremities w/o difficulty. Affect pleasant.  ECG: Marked bradycardia 41 bpm, LBBB (previous)  ASSESSMENT & PLAN:  1. Chronic systolic HF -  Echo 12/31/15 EF 45-50%, Severe LAE, Mild AS, Mild MR - Volume status stable on exam. EF improved on recent echo.  - Stop Coreg with bradycardia - Continue Entresto to 24/26 BID.  2. Afib S/p DCCV 12/25/15 - EKG shows marked bradycardia at 41 bpm. - She is on chronic anticoag with Eliquis 5 mg BID.  - Decrease amiodarone to 200 mg daily. Can recheck LFTs/TSH at next visit.  3. Mod MR/Mod TR  - Both now  showing as mild on more recent echo.  4. Mod Pulmonary HTN - TR Max PG 55.5 mm Hg by previous echo echo  5. HLD - Per PCP 6. Snoring - Has been set up for sleep study. 02/10/16 7. LBBB, - EF improved on formal echo.  If EF trends back down in the future, she may benefit from CRT-D given wide LBBB.  8. Tachy-brady - Now brady after Afib. Med changes as above.  Will check EKG next week. If remains slow will need referal to EP for BiV pacemaker consideration.  9. Deconditioning - Encouraged to do Silver Sneakers and increase activity as able.   Her cardiomyopathy has improved on HF regimen.   With bradycardia, will follow closely with EKG up in 2 weeks. Recheck BMET today.   Has been set up for for sleep study.   Casimiro Needle [address]" [place], PA-C 01/09/2016 11:43 AM    Patient seen and examined with Otilio Saber, PA-C. We discussed all aspects of the encounter. I agree with the assessment and plan as stated above.   She is much improved. Volume status looks good. Maintaining SR but very bradycardic. Will stop carvedilol and reduce amio.  She will likely need CRT. (I also suspect she may have LBBB cardiomyopathy). Refer for [employer].   Bensimhon, Daniel,MD 11:39 AM

## 2016-01-09 ENCOUNTER — Ambulatory Visit (HOSPITAL_COMMUNITY)
Admission: RE | Admit: 2016-01-09 | Discharge: 2016-01-09 | Disposition: A | Discharge status: Home / Self Care | Payer: Medicare Other | Source: Ambulatory Visit | Attending: Internal Medicine | Admitting: Internal Medicine

## 2016-01-09 VITALS — BP 134/52 | HR 51 | Wt 205.8 lb

## 2016-01-09 DIAGNOSIS — I071 Rheumatic tricuspid insufficiency: Secondary | ICD-10-CM

## 2016-01-09 DIAGNOSIS — R001 Bradycardia, unspecified: Secondary | ICD-10-CM | POA: Insufficient documentation

## 2016-01-09 DIAGNOSIS — Z7901 Long term (current) use of anticoagulants: Secondary | ICD-10-CM | POA: Insufficient documentation

## 2016-01-09 DIAGNOSIS — Z79899 Other long term (current) drug therapy: Secondary | ICD-10-CM | POA: Insufficient documentation

## 2016-01-09 DIAGNOSIS — I447 Left bundle-branch block, unspecified: Secondary | ICD-10-CM | POA: Insufficient documentation

## 2016-01-09 DIAGNOSIS — I10 Essential (primary) hypertension: Secondary | ICD-10-CM

## 2016-01-09 DIAGNOSIS — I5022 Chronic systolic (congestive) heart failure: Secondary | ICD-10-CM | POA: Diagnosis not present

## 2016-01-09 DIAGNOSIS — I480 Paroxysmal atrial fibrillation: Secondary | ICD-10-CM

## 2016-01-09 DIAGNOSIS — I340 Nonrheumatic mitral (valve) insufficiency: Secondary | ICD-10-CM

## 2016-01-09 LAB — BASIC METABOLIC PANEL
Anion gap: 7 (ref 5–15)
BUN: 22 mg/dL — ABNORMAL HIGH (ref 6–20)
CALCIUM: 9.7 mg/dL (ref 8.9–10.3)
CO2: 30 mmol/L (ref 22–32)
CREATININE: 0.86 mg/dL (ref 0.44–1.00)
Chloride: 108 mmol/L (ref 101–111)
GFR calc Af Amer: >60 mL/min (ref >60)
GLUCOSE: 93 mg/dL (ref 65–99)
Potassium: 4.3 mmol/L (ref 3.5–5.1)
Sodium: 145 mmol/L (ref 135–145)

## 2016-01-09 MED ORDER — AMIODARONE HCL 200 MG PO TABS: 200.0000 mg | ORAL_TABLET | Freq: Every day | ORAL | Status: DC

## 2016-01-09 MED ORDER — FLUTICASONE PROPIONATE 50 MCG/ACT NA SUSP: 1.0000 | Freq: Two times a day (BID) | NASAL | Status: DC | PRN

## 2016-01-09 NOTE — Progress Notes (Signed)
Advanced Heart Failure Medication Review by a Pharmacist  Does the patient  feel that his/her medications are working for him/her?  yes  Has the patient been experiencing any side effects to the medications prescribed?  no  Does the patient measure his/her own blood pressure or blood glucose at home?  yes   Does the patient have any problems obtaining medications due to transportation or finances?   no  Understanding of regimen: good Understanding of indications: good Potential of compliance: good Patient understands to avoid NSAIDs. Patient understands to avoid decongestants.  Issues to address at subsequent visits: None   Pharmacist comments: 70 YO female presenting with her daughter and medication bottles.  Pt and daughter state that she has been taking carvedilol daily and amiodarone 200 mg BID since cardioversion because they mixed up the directions she was told at cardioversion (Instructed to take Coreg BID and Amiodarone 200 mg daily). Pt denies s/sx of bleeding with eliquis but states that she ran out of eliquis for 3 days over the weekend. Pt not taking ASA for last month but is curious if she should restart.  She has no indication and will discuss with NP. She has some reported dizziness/orthostasis when she gets up quickly but does not have these symptoms when she rises slowly.  Pt reports no other SE or cost barriers to her regimen.    Time with patient: 10 min  Preparation and documentation time: 5 min  Total time: 15 min

## 2016-01-09 NOTE — Patient Instructions (Addendum)
STOP Coreg DECREASE Amiodarone to 200 mg ,one tab daily  Labs today  Your physician recommends that you schedule a follow-up appointment in: 2 weeks In the [facility]

## 2016-01-23 ENCOUNTER — Inpatient Hospital Stay (HOSPITAL_COMMUNITY): Admission: RE | Admit: 2016-01-23 | Payer: Medicare Other | Source: Ambulatory Visit

## 2016-01-23 ENCOUNTER — Telehealth (HOSPITAL_COMMUNITY): Payer: Self-pay | Admitting: Vascular Surgery

## 2016-01-23 NOTE — Telephone Encounter (Signed)
Pt called to cancel appt, she has sinus headache due to her allergies she wants to know what over the counter medication she can take that wont interact with her heart medication

## 2016-01-23 NOTE — Telephone Encounter (Signed)
Advised patient she can take Claritin, allegra, zyrtec OTC for seasonal allergies Advised she can take OTC coricidin BP, plain mucinex or robitussin

## 2016-01-28 ENCOUNTER — Ambulatory Visit (HOSPITAL_COMMUNITY)
Admission: RE | Admit: 2016-01-28 | Discharge: 2016-01-28 | Disposition: A | Discharge status: Home / Self Care | Payer: Medicare Other | Source: Ambulatory Visit | Attending: Internal Medicine | Admitting: Internal Medicine

## 2016-01-28 VITALS — BP 154/82 | HR 58 | Wt 203.4 lb

## 2016-01-28 DIAGNOSIS — E785 Hyperlipidemia, unspecified: Secondary | ICD-10-CM | POA: Insufficient documentation

## 2016-01-28 DIAGNOSIS — I4891 Unspecified atrial fibrillation: Secondary | ICD-10-CM | POA: Insufficient documentation

## 2016-01-28 DIAGNOSIS — Z7901 Long term (current) use of anticoagulants: Secondary | ICD-10-CM | POA: Insufficient documentation

## 2016-01-28 DIAGNOSIS — Z79899 Other long term (current) drug therapy: Secondary | ICD-10-CM | POA: Insufficient documentation

## 2016-01-28 DIAGNOSIS — I110 Hypertensive heart disease with heart failure: Secondary | ICD-10-CM | POA: Diagnosis not present

## 2016-01-28 DIAGNOSIS — I480 Paroxysmal atrial fibrillation: Secondary | ICD-10-CM

## 2016-01-28 DIAGNOSIS — I447 Left bundle-branch block, unspecified: Secondary | ICD-10-CM | POA: Diagnosis not present

## 2016-01-28 DIAGNOSIS — Z8249 Family history of ischemic heart disease and other diseases of the circulatory system: Secondary | ICD-10-CM | POA: Diagnosis not present

## 2016-01-28 DIAGNOSIS — I5022 Chronic systolic (congestive) heart failure: Secondary | ICD-10-CM | POA: Insufficient documentation

## 2016-01-28 DIAGNOSIS — F410 Panic disorder [episodic paroxysmal anxiety] without agoraphobia: Secondary | ICD-10-CM | POA: Diagnosis not present

## 2016-01-28 DIAGNOSIS — L03116 Cellulitis of left lower limb: Secondary | ICD-10-CM

## 2016-01-28 DIAGNOSIS — I081 Rheumatic disorders of both mitral and tricuspid valves: Secondary | ICD-10-CM | POA: Insufficient documentation

## 2016-01-28 DIAGNOSIS — Z803 Family history of malignant neoplasm of breast: Secondary | ICD-10-CM | POA: Insufficient documentation

## 2016-01-28 DIAGNOSIS — R001 Bradycardia, unspecified: Secondary | ICD-10-CM | POA: Diagnosis not present

## 2016-01-28 DIAGNOSIS — I272 Other secondary pulmonary hypertension: Secondary | ICD-10-CM | POA: Insufficient documentation

## 2016-01-28 DIAGNOSIS — I10 Essential (primary) hypertension: Secondary | ICD-10-CM | POA: Diagnosis not present

## 2016-01-28 LAB — COMPREHENSIVE METABOLIC PANEL
ALBUMIN: 3.8 g/dL (ref 3.5–5.0)
ALT: 27 U/L (ref 14–54)
AST: 29 U/L (ref 15–41)
Alkaline Phosphatase: 77 U/L (ref 38–126)
Anion gap: 9 (ref 5–15)
BUN: 15 mg/dL (ref 6–20)
CHLORIDE: 104 mmol/L (ref 101–111)
CO2: 30 mmol/L (ref 22–32)
CREATININE: 0.77 mg/dL (ref 0.44–1.00)
Calcium: 9.4 mg/dL (ref 8.9–10.3)
GFR calc Af Amer: >60 mL/min (ref >60)
GLUCOSE: 108 mg/dL — AB (ref 65–99)
Potassium: 3.7 mmol/L (ref 3.5–5.1)
SODIUM: 143 mmol/L (ref 135–145)
Total Bilirubin: 0.7 mg/dL (ref 0.3–1.2)
Total Protein: 7.2 g/dL (ref 6.5–8.1)

## 2016-01-28 LAB — TSH: TSH: 4.385 u[IU]/mL (ref 0.350–4.500)

## 2016-01-28 MED ORDER — DOXYCYCLINE HYCLATE 100 MG PO TABS: 100.0000 mg | ORAL_TABLET | Freq: Two times a day (BID) | ORAL | Status: DC

## 2016-01-28 MED ORDER — SACUBITRIL-VALSARTAN 49-51 MG PO TABS: 1.0000 | ORAL_TABLET | Freq: Two times a day (BID) | ORAL | Status: DC

## 2016-01-28 NOTE — Progress Notes (Signed)
Patient ID: Kelly Dickson, female   DOB: 23-Jan-1946, 70 y.o.   MRN: [id number]    Advanced Heart Failure Clinic Note   Referring Physician: Dr Willeen Cass Primary Care: Dr Luiz Iron Primary Cardiologist: Dr Willeen Cass Primary HF: Dr. Gala Romney   HPI:  Kelly Dickson is a 70 y.o. female with Mod MR, Afib, HTN, and chronic systolic HF who presents to establish in HF clinic.   Problems started last year while taking care of her chronically ill son, who passed away this 04-Dec-2022. Has been unable to walk long distances for awhile. Summer of 2016 was having episodes of SOB, weakness, and diaphoresis that led to full cardiac work up as below.    Echo 12/31/15 EF 45-50%, Severe LAE, Mild AS, Mild MR  She presents today for regular follow up. At last visit we held coreg with bradycardia. Down 2 lbs since last visit. Has been down in the dumps but otherwise feeling well. Found out her younger sister (7/8) has Stage 4 Breast CA and is dealing with that.  Has made her think a lot of her son.  Weight has been up and down.  Has not had any more dizziness, only gets a little lighthaded if she moves too fast.  Feels like her breathing is "great". Walked up a hill this past weekend without any SOB, where she used to get SOB walking from a handicap space into a store. She is watching fluid and salt. Weight has been stable at home. No palpitations. No CP. Has done silver sneakers 4 times and feels like she is getting stronger. Sleeps on 2 pillows chronically, for comfort (has all of her life)   She states she has had a wound on her leg for nearly two months and has been using bacitracin and saline daily on it.   LHC 06/27/15 - Dr Rolly Salter Willeen Cass - Right dominant anatomy - Left main - medium size and normal - LAD - medium caliber, 20% lesion in proximal portion - LCx - medium caliber, with few obtuse marginal branches but normal - RCA - large and dominant artery, without signifncat disease.   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 EF ~ 36%.  ROS: All systems reviewed and negative except as listed in HPI.   Past Medical History  Diagnosis Date  . Panic attack   . A-fib (HCC)   . CHF (congestive heart failure) (HCC)   . Essential hypertension   . MR (mitral regurgitation)   . TR (tricuspid regurgitation)   . Pulmonary hypertension (HCC)   . HLD (hyperlipidemia)     Current Outpatient Prescriptions  Medication Sig Dispense Refill  . acetaminophen (TYLENOL) 500 MG tablet Take 500 mg by mouth at bedtime as needed for moderate pain.     [name] amiodarone (PACERONE) 200 MG tablet Take 1 tablet (200 mg total) by mouth daily. 30 tablet 6  . apixaban (ELIQUIS) 5 MG TABS tablet Take 1 tablet (5 mg total) by mouth 2 (two) times daily. 60 tablet 6  . bacitracin 500 UNIT/GM ointment Apply 1 application topically daily. (Patient taking differently: Apply 1 application topically daily as needed for wound care. ) 15 g 0  . benzonatate (TESSALON PERLES) 100 MG capsule Take 100 mg by mouth daily as needed for cough. Reported on 12/19/2015    .  Bioflavonoid Products (VITAMIN C/BIOFLAVONOIDS) 1000-25 MG TABS Take 1 tablet by mouth daily. Reported on 12/24/2015    . Calcium-Vitamin D-Vitamin K (CALCIUM + D) [id number] MG-UNT-MCG CHEW Chew 2 tablets by mouth 2 (two) times daily.     . cholecalciferol (VITAMIN D) 1000 units tablet Take 1,000 Units by mouth 2 (two) times daily.     . Coenzyme Q10 (COQ10) 100 MG CAPS Take 100 mg by mouth 2 (two) times daily.    . DULoxetine (CYMBALTA) 30 MG capsule Take 30 mg by mouth daily.    . fluticasone (FLONASE) 50 MCG/ACT nasal spray Place 1 spray into both nostrils 2 (two) times daily as needed for allergies or rhinitis. 16 g 0  . furosemide (LASIX) 40 MG tablet Take 1 tablet (40 mg total) by mouth daily. 30 tablet 6   . lactobacillus acidophilus & bulgar (LACTINEX) chewable tablet Chew 1 tablet by mouth at bedtime.    [name] LECITHIN PO Take 1,200-2,400 mg by mouth 2 (two) times daily.     . Multiple Vitamins-Minerals (MULTIVITAMIN WITH MINERALS) tablet Take 1 tablet by mouth daily.    . Omega-3 Fatty Acids (FISH OIL) 1000 MG CAPS Take 1,000 mg by mouth 2 (two) times daily.    . Red Yeast Rice 600 MG CAPS Take 600 mg by mouth 2 (two) times daily.    . sacubitril-valsartan (ENTRESTO) 24-26 MG Take 1 tablet by mouth 2 (two) times daily.    [name] spironolactone (ALDACTONE) 25 MG tablet Take 12.5 mg by mouth daily at 12 noon.     . vitamin E 400 UNIT capsule Take 400 Units by mouth daily as needed (for supplementation).      No current facility-administered medications for this encounter.    No Known Allergies    Social History   Social History  . Marital Status: Divorced    Spouse Name: N/A  . Number of Children: N/A  . Years of Education: N/A   Occupational History  . Not on file.   Social History Main Topics  . Smoking status: Never Smoker   . Smokeless tobacco: Not on file  . Alcohol Use: No  . Drug Use: No  . Sexual Activity: Not on file   Other Topics Concern  . Not on file   Social History Narrative      Family History  Problem Relation Age of Onset  . Breast cancer Mother   . Hypertension Sister     Ceasar Mons Vitals:   01/28/16 1456  BP: 154/82  Pulse: 58  Weight: 203 lb 6.4 oz (92.262 kg)  SpO2: 99%   Wt Readings from Last 3 Encounters:  01/28/16 203 lb 6.4 oz (92.262 kg)  01/09/16 205 lb 12.8 oz (93.35 kg)  12/25/15 198 lb (89.812 kg)    PHYSICAL EXAM: General:  Elderly appearing. No respiratory difficulty HEENT: normal Neck: supple. JVD does not appear elevated. Carotids 2+ bilat; no bruits. No thyromegaly or nodule noted.  Cor: PMI nondisplaced. Regular, markedly bradycardic. Split s2. No rubs, gallops or murmurs appreciated. Lungs: CTAB, normal effort Abdomen: soft,  NT, ND, no HSM. No bruits or masses. +BS  Extremities: no cyanosis, clubbing. Trace-1+ edema. Not wearing compression stockings currently. Wound with approx 1.5 cm eschar with surrounding erythema and edema to posterior LLE. Slightly warmer than surrounding tissue.  Neuro: alert & oriented x 3, cranial nerves grossly intact. moves all 4 extremities w/o difficulty. Affect pleasant.  ECG: Sinus brady at 53 bpm, LBBB.   ASSESSMENT &  PLAN:  1. Chronic systolic HF - Echo 12/31/15 EF 40-98%, Severe LAE, Mild AS, Mild MR - Volume status stable on exam, encouraged to continue compression stockings. EF improved on recent echo.  - Continue to hold Coreg with bradycardia - Incresase Entresto to 49/51 BID. CMET today and recheck in 10 days.  - Encouraged to continue daily weights and limit fluid and salt.  Knows to call HF clinic with weight gains of 3 lbs overnight or 5 lbs within one week.  2. Afib S/p DCCV 12/25/15 - EKG shows sinus bradycardia at 53 bpm. - She is on chronic anticoag with Eliquis 5 mg BID.  - Continue amiodarone 200 mg daily. Check LFTs/TSH today. 3. Mod MR/Mod TR  - Both now showing as mild on more recent echo.  4. Mod Pulmonary HTN - TR Max PG 55.5 mm Hg by previous echo  5. HLD - Per PCP 6. Snoring - Has been set up for sleep study. 02/10/16 7. LBBB, by echo today. - EF improved on formal echo.  If EF trends back down in the future, she may benefit from CRT-D given wide LBBB.  8. Tachy-brady - Now brady after Afib. Off BB.    - If remains slow will need referal to EP for BiV pacemaker consideration.  9. Deconditioning - Encouraged to continue Silver Sneakers and increase activity as able.  10. LLE wound - Poorly healing wound for 2 months now. Debrided in office without complication by Tonye Becket, NP-C.   - Will start doxycycline 100 mg BID x 7 days. If unimproved in a week, knows to call for referall to wound clinic.  CMET and TSH today. Repeat BMET in 10 days since  we are going up on Entresto.   Bradycardia has improved slightly. Will continue to monitor closely with EKGs at each visit. Again educated on symptoms to watch out for including worsening fatigue, dizziness, near-syncope, or lightheadedness.  Has been set up for for sleep study.   Casimiro Needle [id number] [place], PA-C 01/28/2016 3:07 PM

## 2016-01-28 NOTE — Patient Instructions (Signed)
START Doxycycline 100 mg tablet twice daily for 10 days.  INCREASE Entresto to 49/51 mg tablet twice daily.  Wound Instructions: Keep Allevyn pink foam dressing on until tomorrow. Tomorrow, take a shower and wash affected area with clean wash cloth and soap. Leave open to dry after showering and place Allevyn dressing on site.  Avoid any ointments, creams, etc. Keep covered until dressing falls off (may last up to a week).  Routine lab work today. Will notify you of abnormal results, otherwise no news is good news!  Return in 1-2 weeks for repeat labs.  Follow up 1 month with Dr. Gala Romney.  Do the following things EVERYDAY: 1) Weigh yourself in the morning before breakfast. Write it down and keep it in a log. 2) Take your medicines as prescribed 3) Eat low salt foods-Limit salt (sodium) to 2000 mg per day.  4) Stay as active as you can everyday 5) Limit all fluids for the day to less than 2 liters

## 2016-02-03 ENCOUNTER — Ambulatory Visit (HOSPITAL_COMMUNITY)
Admission: RE | Admit: 2016-02-03 | Discharge: 2016-02-03 | Disposition: A | Discharge status: Home / Self Care | Payer: Medicare Other | Source: Ambulatory Visit | Attending: Internal Medicine | Admitting: Internal Medicine

## 2016-02-03 DIAGNOSIS — I480 Paroxysmal atrial fibrillation: Secondary | ICD-10-CM | POA: Diagnosis not present

## 2016-02-03 LAB — BASIC METABOLIC PANEL
ANION GAP: 12 (ref 5–15)
BUN: 24 mg/dL — AB (ref 6–20)
CALCIUM: 9.4 mg/dL (ref 8.9–10.3)
CO2: 25 mmol/L (ref 22–32)
Chloride: 105 mmol/L (ref 101–111)
Creatinine, Ser: 0.84 mg/dL (ref 0.44–1.00)
GFR calc Af Amer: >60 mL/min (ref >60)
Glucose, Bld: 127 mg/dL — ABNORMAL HIGH (ref 65–99)
POTASSIUM: 3.5 mmol/L (ref 3.5–5.1)
SODIUM: 142 mmol/L (ref 135–145)

## 2016-02-05 ENCOUNTER — Telehealth (HOSPITAL_COMMUNITY): Payer: Self-pay | Admitting: Cardiology

## 2016-02-05 DIAGNOSIS — S81802A Unspecified open wound, left lower leg, initial encounter: Secondary | ICD-10-CM

## 2016-02-05 MED ORDER — SPIRONOLACTONE 25 MG PO TABS: 25.0000 mg | ORAL_TABLET | Freq: Every day | ORAL | Status: DC

## 2016-02-05 NOTE — Telephone Encounter (Signed)
Patient aware of lab results and medication changes And as requested at lab visit 4/3, referral placed for wound center for further management of LLE wound

## 2016-02-07 ENCOUNTER — Other Ambulatory Visit (HOSPITAL_COMMUNITY): Payer: Medicare Other

## 2016-02-10 ENCOUNTER — Encounter (HOSPITAL_BASED_OUTPATIENT_CLINIC_OR_DEPARTMENT_OTHER): Payer: Medicare Other

## 2016-02-10 ENCOUNTER — Encounter (HOSPITAL_COMMUNITY): Payer: Medicare Other | Admitting: Internal Medicine

## 2016-02-19 ENCOUNTER — Encounter (HOSPITAL_COMMUNITY): Payer: Self-pay | Admitting: Internal Medicine

## 2016-02-20 ENCOUNTER — Encounter (HOSPITAL_COMMUNITY): Payer: Self-pay

## 2016-02-20 ENCOUNTER — Emergency Department (HOSPITAL_COMMUNITY): Payer: Medicare Other

## 2016-02-20 ENCOUNTER — Emergency Department (HOSPITAL_COMMUNITY)
Admission: EM | Admit: 2016-02-20 | Discharge: 2016-02-20 | Disposition: A | Discharge status: Home / Self Care | Payer: Medicare Other | Attending: Emergency Medicine | Admitting: Emergency Medicine

## 2016-02-20 ENCOUNTER — Telehealth (HOSPITAL_COMMUNITY): Payer: Self-pay

## 2016-02-20 DIAGNOSIS — E785 Hyperlipidemia, unspecified: Secondary | ICD-10-CM | POA: Insufficient documentation

## 2016-02-20 DIAGNOSIS — Z7901 Long term (current) use of anticoagulants: Secondary | ICD-10-CM | POA: Diagnosis not present

## 2016-02-20 DIAGNOSIS — S81802A Unspecified open wound, left lower leg, initial encounter: Secondary | ICD-10-CM | POA: Insufficient documentation

## 2016-02-20 DIAGNOSIS — Z79899 Other long term (current) drug therapy: Secondary | ICD-10-CM | POA: Insufficient documentation

## 2016-02-20 DIAGNOSIS — I509 Heart failure, unspecified: Secondary | ICD-10-CM | POA: Diagnosis not present

## 2016-02-20 DIAGNOSIS — Y999 Unspecified external cause status: Secondary | ICD-10-CM | POA: Diagnosis not present

## 2016-02-20 DIAGNOSIS — I110 Hypertensive heart disease with heart failure: Secondary | ICD-10-CM | POA: Diagnosis not present

## 2016-02-20 DIAGNOSIS — W2209XA Striking against other stationary object, initial encounter: Secondary | ICD-10-CM | POA: Insufficient documentation

## 2016-02-20 DIAGNOSIS — Y929 Unspecified place or not applicable: Secondary | ICD-10-CM | POA: Diagnosis not present

## 2016-02-20 DIAGNOSIS — I4891 Unspecified atrial fibrillation: Secondary | ICD-10-CM | POA: Diagnosis not present

## 2016-02-20 DIAGNOSIS — Y939 Activity, unspecified: Secondary | ICD-10-CM | POA: Insufficient documentation

## 2016-02-20 DIAGNOSIS — Z96659 Presence of unspecified artificial knee joint: Secondary | ICD-10-CM | POA: Diagnosis not present

## 2016-02-20 LAB — BASIC METABOLIC PANEL
ANION GAP: 10 (ref 5–15)
BUN: 24 mg/dL — ABNORMAL HIGH (ref 6–20)
CHLORIDE: 104 mmol/L (ref 101–111)
CO2: 27 mmol/L (ref 22–32)
Calcium: 9.6 mg/dL (ref 8.9–10.3)
Creatinine, Ser: 0.95 mg/dL (ref 0.44–1.00)
GFR calc Af Amer: >60 mL/min (ref >60)
GFR calc non Af Amer: 59 mL/min — ABNORMAL LOW (ref >60)
GLUCOSE: 106 mg/dL — AB (ref 65–99)
POTASSIUM: 4.4 mmol/L (ref 3.5–5.1)
Sodium: 141 mmol/L (ref 135–145)

## 2016-02-20 LAB — CBC WITH DIFFERENTIAL/PLATELET
Basophils Absolute: 0 10*3/uL (ref 0.0–0.1)
Basophils Relative: 1 %
Eosinophils Absolute: 0.1 10*3/uL (ref 0.0–0.7)
Eosinophils Relative: 2 %
HEMATOCRIT: 36.2 % (ref 36.0–46.0)
HEMOGLOBIN: 12 g/dL (ref 12.0–15.0)
LYMPHS ABS: 1.4 10*3/uL (ref 0.7–4.0)
LYMPHS PCT: 22 %
MCH: 30.7 pg (ref 26.0–34.0)
MCHC: 33.1 g/dL (ref 30.0–36.0)
MCV: 92.6 fL (ref 78.0–100.0)
MONO ABS: 0.5 10*3/uL (ref 0.1–1.0)
MONOS PCT: 9 %
NEUTROS ABS: 4.3 10*3/uL (ref 1.7–7.7)
NEUTROS PCT: 66 %
Platelets: 214 10*3/uL (ref 150–400)
RBC: 3.91 MIL/uL (ref 3.87–5.11)
RDW: 13.6 % (ref 11.5–15.5)
WBC: 6.4 10*3/uL (ref 4.0–10.5)

## 2016-02-20 LAB — SEDIMENTATION RATE: Sed Rate: 31 mm/hr — ABNORMAL HIGH (ref 0–22)

## 2016-02-20 MED ORDER — CLINDAMYCIN HCL 300 MG PO CAPS: 300.0000 mg | ORAL_CAPSULE | Freq: Four times a day (QID) | ORAL | Status: DC

## 2016-02-20 MED ORDER — HYDROCODONE-ACETAMINOPHEN 5-325 MG PO TABS: 1.0000 | ORAL_TABLET | Freq: Four times a day (QID) | ORAL | Status: DC | PRN

## 2016-02-20 NOTE — Telephone Encounter (Addendum)
Patient's daughter left VM on CHF triage line upset that mother's recent OV with CHF clinic has not been followed up on pertaining specifically to a leg wound. Daughter states that mother was told our office removed scab from leg wound and that we would "send scab off to be tested for MRSA". Per Mardelle Matte Tillery's note from this visit:  10. LLE wound  - Poorly healing wound for 2 months now. Debrided in office without complication by Tonye Becket, NP-C.   - Will start doxycycline 100 mg BID x 7 days. If unimproved in a week, knows to call for referall to wound clinic. Our office did not send any cultures for MRSA as this is not a test we routinely do in CHF clinic for chronic wounds.  However, we did start her on antibiotics and refer her to wound center as of 02/05/2016 per CMA documentation in epic. Attempted to return call, no answer.  In looking in patient's chart, she is currently being seen in [facility] ED for this condition.  Ave Filter

## 2016-02-20 NOTE — ED Notes (Signed)
Pt here with leg wound starting in January.  Pt nicked leg.  Left lower on a chair.  Wound is not healing.

## 2016-02-20 NOTE — ED Provider Notes (Signed)
CSN: [id number]     Arrival date & time 02/20/16  1145 History   First MD Initiated Contact with Patient 02/20/16 1319     Chief Complaint  Patient presents with  . Wound Infection     (Consider location/radiation/quality/duration/timing/severity/associated sxs/prior Treatment) HPI  70 year old female presents with a nonhealing wound infection in her left leg. This has been present since she was admitted to the hospital in January 2017. Seems to be slowly worsening and not healing. She saw her cardiologist at the end of March and was given doxycycline 1 week with no significant relief. At that time a wound care nurse took the scab off of her wound. Has never improved since. Has been trying to get a referral from her cardiologist and PCP to the wound care specialist that this has not occurred yet. Due to this, she presented to the ER for help. No fevers. There is some redness. No drainage although it was draining earlier. Pain seems to be worsening in this area. She has poor circulation per the daughter, worse in the left leg than right. Has vein problems in her left leg.  Past Medical History  Diagnosis Date  . Panic attack   . A-fib (HCC)   . CHF (congestive heart failure) (HCC)   . Essential hypertension   . MR (mitral regurgitation)   . TR (tricuspid regurgitation)   . Pulmonary hypertension (HCC)   . HLD (hyperlipidemia)    Past Surgical History  Procedure Laterality Date  . Replacement total knee    . Hernia repair    . Tonsillectomy    . Cardioversion N/A 12/25/2015    Procedure: CARDIOVERSION;  Surgeon: Dolores Patty, MD;  Location: [facility] ENDOSCOPY;  Service: Cardiovascular;  Laterality: N/A;   Family History  Problem Relation Age of Onset  . Breast cancer Mother   . Hypertension Sister    Social History  Substance Use Topics  . Smoking status: Never Smoker   . Smokeless tobacco: None  . Alcohol Use: No   OB History    No data available     Review of Systems    Constitutional: Negative for fever.  Musculoskeletal: Positive for myalgias.  Skin: Positive for color change and wound.  Neurological: Negative for weakness and numbness.  All other systems reviewed and are negative.     Allergies  Review of patient's allergies indicates no known allergies.  Home Medications   Prior to Admission medications   Medication Sig Start Date End Date Taking? Authorizing Provider  acetaminophen (TYLENOL) 500 MG tablet Take 500 mg by mouth at bedtime as needed for moderate pain.     Historical Provider, MD  amiodarone (PACERONE) 200 MG tablet Take 1 tablet (200 mg total) by mouth daily. 01/09/16   Graciella Freer, PA-C  apixaban (ELIQUIS) 5 MG TABS tablet Take 1 tablet (5 mg total) by mouth 2 (two) times daily. 01/06/16   Dolores Patty, MD  bacitracin 500 UNIT/GM ointment Apply 1 application topically daily. Patient taking differently: Apply 1 application topically daily as needed for wound care.  11/30/15   Costin Otelia Sergeant, MD  benzonatate (TESSALON PERLES) 100 MG capsule Take 100 mg by mouth daily as needed for cough. Reported on 12/19/2015 11/22/15 11/21/16  Historical Provider, MD  Bioflavonoid Products (VITAMIN C/BIOFLAVONOIDS) 1000-25 MG TABS Take 1 tablet by mouth daily. Reported on 12/24/2015    Historical Provider, MD  Calcium-Vitamin D-Vitamin K (CALCIUM + D) [phone] MG-UNT-MCG CHEW Chew 2 tablets by mouth  2 (two) times daily.     Historical Provider, MD  cholecalciferol (VITAMIN D) 1000 units tablet Take 1,000 Units by mouth 2 (two) times daily.     Historical Provider, MD  Coenzyme Q10 (COQ10) 100 MG CAPS Take 100 mg by mouth 2 (two) times daily.    Historical Provider, MD  doxycycline (VIBRA-TABS) 100 MG tablet Take 1 tablet (100 mg total) by mouth 2 (two) times daily. 01/28/16   Graciella Freer, PA-C  DULoxetine (CYMBALTA) 30 MG capsule Take 30 mg by mouth daily.    Historical Provider, MD  fluticasone (FLONASE) 50 MCG/ACT nasal  spray Place 1 spray into both nostrils 2 (two) times daily as needed for allergies or rhinitis. 01/09/16   Graciella Freer, PA-C  furosemide (LASIX) 40 MG tablet Take 1 tablet (40 mg total) by mouth daily. 01/06/16   Dolores Patty, MD  lactobacillus acidophilus & bulgar (LACTINEX) chewable tablet Chew 1 tablet by mouth at bedtime.    Historical Provider, MD  LECITHIN PO Take 1,200-2,400 mg by mouth 2 (two) times daily.     Historical Provider, MD  Multiple Vitamins-Minerals (MULTIVITAMIN WITH MINERALS) tablet Take 1 tablet by mouth daily.    Historical Provider, MD  Omega-3 Fatty Acids (FISH OIL) 1000 MG CAPS Take 1,000 mg by mouth 2 (two) times daily.    Historical Provider, MD  Red Yeast Rice 600 MG CAPS Take 600 mg by mouth 2 (two) times daily.    Historical Provider, MD  sacubitril-valsartan (ENTRESTO) 49-51 MG Take 1 tablet by mouth 2 (two) times daily. 01/28/16   Graciella Freer, PA-C  spironolactone (ALDACTONE) 25 MG tablet Take 1 tablet (25 mg total) by mouth daily at 12 noon. 02/05/16   Graciella Freer, PA-C  vitamin E 400 UNIT capsule Take 400 Units by mouth daily as needed (for supplementation).     Historical Provider, MD   BP 125/61 mmHg  Pulse 58  Temp(Src) 98.5 F (36.9 C) (Oral)  Resp 16  SpO2 97% Physical Exam  Constitutional: She is oriented to person, place, and time. She appears well-developed and well-nourished.  HENT:  Head: Normocephalic and atraumatic.  Right Ear: External ear normal.  Left Ear: External ear normal.  Nose: Nose normal.  Eyes: Right eye exhibits no discharge. Left eye exhibits no discharge.  Cardiovascular: Normal rate.   Pulses:      Dorsalis pedis pulses are 2+ on the left side.  Pulmonary/Chest: Effort normal.  Abdominal: She exhibits no distension.  Musculoskeletal: She exhibits tenderness.       Left lower leg: She exhibits tenderness.       Legs: Neurological: She is alert and oriented to person, place, and time.   Skin: Skin is warm and dry.  Nursing note and vitals reviewed.     ED Course  Procedures (including critical care time) Labs Review Labs Reviewed  BASIC METABOLIC PANEL - Abnormal; Notable for the following:    Glucose, Bld 106 (*)    BUN 24 (*)    GFR calc non Af Amer 59 (*)    All other components within normal limits  SEDIMENTATION RATE - Abnormal; Notable for the following:    Sed Rate 31 (*)    All other components within normal limits  CBC WITH DIFFERENTIAL/PLATELET    Imaging Review Dg Tibia/fibula Left  02/20/2016  CLINICAL DATA:  Nonhealing wound left lower extremity EXAM: LEFT TIBIA AND FIBULA - 2 VIEW COMPARISON:  None. FINDINGS: A total left  knee arthroplasty is noted. No complicating features. Remote healed proximal fibular shaft fracture there are chronic periosteal thickening. No acute bony findings or destructive bony changes. No radiopaque foreign body or gas in the soft tissues. The ankle joint is maintained. IMPRESSION: No significant bony findings. Electronically Signed   By: Rudie Meyer M.D.   On: 02/20/2016 14:37   I have personally reviewed and evaluated these images and lab results as part of my medical decision-making.   EKG Interpretation None      MDM   Final diagnoses:  Leg wound, left, initial encounter    Patient with a nonhealing wound to the posterior aspect of her lower leg for 3 months. Started when she bumped it on a door. Likely as a result of poor venous return. She has some pain but very low suspicion for a deep space infection. There is some discoloration surrounding the wound, we'll try a different antibiotic. However this time she does need a wound care referral. Family wants one in the ED but this does not appear to be an emergent issue. I will give them the phone number for the wound care center but she does not need IV antibiotics ordered admission in my opinion. Will give oral pain control.    Pricilla Loveless, MD 02/20/16  [phone]

## 2016-02-21 ENCOUNTER — Encounter (HOSPITAL_COMMUNITY): Payer: Self-pay | Admitting: Internal Medicine

## 2016-03-02 ENCOUNTER — Encounter (HOSPITAL_COMMUNITY): Payer: Medicare Other | Admitting: Internal Medicine

## 2016-03-02 ENCOUNTER — Ambulatory Visit (HOSPITAL_COMMUNITY)
Admission: RE | Admit: 2016-03-02 | Discharge: 2016-03-02 | Disposition: A | Discharge status: Home / Self Care | Payer: Medicare Other | Source: Ambulatory Visit | Attending: Internal Medicine | Admitting: Internal Medicine

## 2016-03-02 VITALS — BP 142/86 | HR 58 | Wt 203.0 lb

## 2016-03-02 DIAGNOSIS — Z7901 Long term (current) use of anticoagulants: Secondary | ICD-10-CM | POA: Diagnosis not present

## 2016-03-02 DIAGNOSIS — Z79899 Other long term (current) drug therapy: Secondary | ICD-10-CM | POA: Diagnosis not present

## 2016-03-02 DIAGNOSIS — I110 Hypertensive heart disease with heart failure: Secondary | ICD-10-CM | POA: Insufficient documentation

## 2016-03-02 DIAGNOSIS — I340 Nonrheumatic mitral (valve) insufficiency: Secondary | ICD-10-CM | POA: Diagnosis not present

## 2016-03-02 DIAGNOSIS — I480 Paroxysmal atrial fibrillation: Secondary | ICD-10-CM | POA: Insufficient documentation

## 2016-03-02 DIAGNOSIS — E785 Hyperlipidemia, unspecified: Secondary | ICD-10-CM | POA: Insufficient documentation

## 2016-03-02 DIAGNOSIS — I447 Left bundle-branch block, unspecified: Secondary | ICD-10-CM | POA: Insufficient documentation

## 2016-03-02 DIAGNOSIS — R001 Bradycardia, unspecified: Secondary | ICD-10-CM | POA: Insufficient documentation

## 2016-03-02 DIAGNOSIS — I5022 Chronic systolic (congestive) heart failure: Secondary | ICD-10-CM | POA: Diagnosis not present

## 2016-03-02 DIAGNOSIS — I10 Essential (primary) hypertension: Secondary | ICD-10-CM | POA: Diagnosis not present

## 2016-03-02 DIAGNOSIS — F410 Panic disorder [episodic paroxysmal anxiety] without agoraphobia: Secondary | ICD-10-CM | POA: Diagnosis not present

## 2016-03-02 DIAGNOSIS — R0683 Snoring: Secondary | ICD-10-CM | POA: Insufficient documentation

## 2016-03-02 DIAGNOSIS — I071 Rheumatic tricuspid insufficiency: Secondary | ICD-10-CM

## 2016-03-02 DIAGNOSIS — I272 Other secondary pulmonary hypertension: Secondary | ICD-10-CM | POA: Diagnosis not present

## 2016-03-02 DIAGNOSIS — Z8249 Family history of ischemic heart disease and other diseases of the circulatory system: Secondary | ICD-10-CM | POA: Diagnosis not present

## 2016-03-02 MED ORDER — SACUBITRIL-VALSARTAN 97-103 MG PO TABS: 1.0000 | ORAL_TABLET | Freq: Two times a day (BID) | ORAL | Status: DC

## 2016-03-02 NOTE — Patient Instructions (Signed)
INCREASE Entresto to 97/103 mg ,one tab twice a day  Labs needed in 1 week  Your physician has recommended that you have a sleep study. This test records several body functions during sleep, including: brain activity, eye movement, oxygen and carbon dioxide blood levels, heart rate and rhythm, breathing rate and rhythm, the flow of air through your mouth and nose, snoring, body muscle movements, and chest and belly movement.  Your physician recommends that you schedule a follow-up appointment in: 1 month In the [facility]   Do the following things EVERYDAY: 1) Weigh yourself in the morning before breakfast. Write it down and keep it in a log. 2) Take your medicines as prescribed 3) Eat low salt foods-Limit salt (sodium) to 2000 mg per day.  4) Stay as active as you can everyday 5) Limit all fluids for the day to less than 2 liters 6)

## 2016-03-02 NOTE — Progress Notes (Signed)
Patient ID: Kelly Dickson, female   DOB: [birth year], 70 y.o.   MRN: [id number]    Advanced Heart Failure Clinic Note   Referring Physician: Dr Willeen Cass Primary Care: Dr Luiz Iron Primary Cardiologist: Dr Willeen Cass Primary HF: Dr. Gala Romney   HPI:  Kelly Dickson is a 70 y.o. female with Mod MR, Afib, HTN, and chronic systolic HF who presents to establish in HF clinic.   Problems started last year while taking care of her chronically ill son, who passed away this Nov 12, 2022. Has been unable to walk long distances for awhile. Summer of 2016 was having episodes of SOB, weakness, and diaphoresis that led to full cardiac work up as below.    Echo 12/31/15 EF 45-50%, Severe LAE, Mild AS, Mild MR  At last visit had wound on LLE that was debrided in clinic and started on doxycycline. She stated that the injury was from her previous hospitalization in 2015-11-13.  It continued to worsen and she was given a referral to Wound Center but never heard back.  Her daughter self arranged wound care follow up in [facility] and they were seen the week 02/26/16.  She presents today for regular follow up. Weight stable since last visit. Has not been walking as directed by wound care provider, keeping off of it as much as possible. Has been sleeping poorly, doesn't tolerate large amount of pain medicine. Tramadol doesn't work. Follow up in [facility] tomorrow.  Pain is main limiting factor right now.  States breathing has been relatively stable, was a little out of breath rushing into clinic (was a little late, couldn't remember gate code). Has been taking daily weights and medications as directed.   LHC 06/27/15 - Dr Rolly Salter Willeen Cass - Right dominant anatomy - Left main - medium size and normal - LAD - medium caliber, 20% lesion in proximal portion - LCx - medium caliber, with few obtuse marginal branches but normal - RCA - large and dominant artery, without signifcant disease.   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 EF ~ 36%.  ROS: All systems reviewed and negative except as listed in HPI.   Past Medical History  Diagnosis Date  . Panic attack   . A-fib (HCC)   . CHF (congestive heart failure) (HCC)   . Essential hypertension   . MR (mitral regurgitation)   . TR (tricuspid regurgitation)   . Pulmonary hypertension (HCC)   . HLD (hyperlipidemia)     Current Outpatient Prescriptions  Medication Sig Dispense Refill  . acetaminophen (TYLENOL) 650 MG CR tablet Take 1,300 mg by mouth every 8 (eight) hours as needed for pain.    [name] acetaminophen-codeine (TYLENOL #3) 300-30 MG tablet Take 1 tablet by mouth every 8 (eight) hours as needed for moderate pain.    [name] amiodarone (PACERONE) 200 MG tablet Take 1 tablet (200 mg total) by mouth daily. 30 tablet 6  . apixaban (ELIQUIS) 5 MG TABS tablet Take 1 tablet (5 mg total) by mouth 2 (two) times daily. 60 tablet 6  . Bioflavonoid Products (VITAMIN C/BIOFLAVONOIDS) 1000-25 MG TABS Take 1 tablet by mouth daily. Reported on 12/24/2015    . Calcium Carbonate-Vitamin D (CALCIUM-VITAMIN D) 500-200 MG-UNIT tablet Take 1 tablet by mouth daily.    . Coenzyme Q10 (COQ10) 100 MG  CAPS Take 100 mg by mouth daily.     [name] doxycycline (DORYX) 100 MG EC tablet Take 100 mg by mouth 2 (two) times daily.    . DULoxetine (CYMBALTA) 30 MG capsule Take 30 mg by mouth daily.    . fluticasone (FLONASE) 50 MCG/ACT nasal spray Place 1 spray into both nostrils 2 (two) times daily as needed for allergies or rhinitis. 16 g 0  . furosemide (LASIX) 40 MG tablet Take 1 tablet (40 mg total) by mouth daily. 30 tablet 6  . lactobacillus acidophilus & bulgar (LACTINEX) chewable tablet Chew 3 tablets by mouth at bedtime.     [name] LECITHIN PO Take 2 tablets by mouth 2 (two) times daily.     [name] loratadine (CLARITIN) 10  MG tablet Take 10 mg by mouth daily as needed for allergies.    . Multiple Vitamins-Minerals (MULTIVITAMIN WITH MINERALS) tablet Take 1 tablet by mouth daily.    . Omega-3 Fatty Acids (FISH OIL) 1000 MG CAPS Take 1,000 mg by mouth daily.     . Polyvinyl Alcohol-Povidone (MURINE TEARS FOR DRY EYES) 5-6 MG/ML SOLN Apply 1-2 drops to eye as needed (for dry eyes and allergies).    . Red Yeast Rice 600 MG CAPS Take 600 mg by mouth daily.     . sacubitril-valsartan (ENTRESTO) 49-51 MG Take 1 tablet by mouth 2 (two) times daily. 60 tablet 3  . spironolactone (ALDACTONE) 25 MG tablet Take 1 tablet (25 mg total) by mouth daily at 12 noon. 30 tablet 6  . vitamin E 400 UNIT capsule Take 400 Units by mouth daily.     . benzonatate (TESSALON PERLES) 100 MG capsule Take 100 mg by mouth daily as needed for cough. Reported on 03/02/2016    . HYDROcodone-acetaminophen (NORCO) 5-325 MG tablet Take 1 tablet by mouth every 6 (six) hours as needed for severe pain. (Patient not taking: Reported on 03/02/2016) 10 tablet 0   No current facility-administered medications for this encounter.    No Known Allergies    Social History   Social History  . Marital Status: Divorced    Spouse Name: N/A  . Number of Children: N/A  . Years of Education: N/A   Occupational History  . Not on file.   Social History Main Topics  . Smoking status: Never Smoker   . Smokeless tobacco: Not on file  . Alcohol Use: No  . Drug Use: No  . Sexual Activity: Not on file   Other Topics Concern  . Not on file   Social History Narrative      Family History  Problem Relation Age of Onset  . Breast cancer Mother   . Hypertension Sister     Ceasar Mons Vitals:   03/02/16 0915  BP: 142/86  Pulse: 58  Weight: 203 lb (92.08 kg)  SpO2: 97%     Wt Readings from Last 3 Encounters:  03/02/16 203 lb (92.08 kg)  01/28/16 203 lb 6.4 oz (92.262 kg)  01/09/16 205 lb 12.8 oz (93.35 kg)    PHYSICAL EXAM: General:  Elderly appearing. No  respiratory difficulty HEENT: normal Neck: supple. JVD 6-7 cm. Carotids 2+ bilat; no bruits. No thyromegaly or nodule noted.  Cor: PMI nondisplaced. Regular, Split s2. No rubs, gallops or murmurs appreciated. Lungs: Clear Abdomen: soft, NT, ND, no HSM. No bruits or masses. +BS  Extremities: no cyanosis, clubbing. No edema. LLE wrapped with ACE bandage.  Neuro: alert & oriented x 3, cranial nerves grossly intact. moves  all 4 extremities w/o difficulty. Affect pleasant.  ECG: Sinus brady 55. LBBB  ASSESSMENT & PLAN:  1. Chronic systolic HF - Echo 12/31/15 EF 53-61%, Severe LAE, Mild AS, Mild MR - NYHA II. Volume status stable on exam, encouraged to continue compression stockings. EF improved on recent echo.  - No Coreg with bradycardia - Incresase Entresto to 97/103 BID. BMET in 10 days.  - Encouraged to continue daily weights and limit fluid and salt.  Knows to call HF clinic with weight gains of 3 lbs overnight or 5 lbs within one week.  - Encouraged compression stocking leg w/o wound as well.  2. Paroxysmal Afib S/p DCCV 12/25/15 - EKG shows sinus bradycardia at 55 bpm. - She is on chronic anticoag with Eliquis 5 mg BID.  - Continue amiodarone 200 mg daily.  LFTs and TSHs normal 01/28/16 3. Mod MR/Mod TR  - Both now showing as mild on more recent echo.  4. 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 TR Max PG 55.5 mm Hg by previous echo  5. HLD - Per PCP 6. Snoring - Will need to reschedule sleep study 7. LBBB, 170 ms by echo today. - EF improved on formal echo.  If EF trends back down in the future, she may benefit from CRT-D given wide LBBB.  8. Tachy-brady - Now brady after Afib. Off BB.    - If remains slow will need referal to EP for BiV pacemaker consideration.  9. Deconditioning - Encouraged to continue Silver Sneakers and increase activity as able.  10. LLE wound - Poorly healing wound for several months, now managed by wound clinic in [facility]. - Has f/u  tomorrow  Will need to reschedule sleep study  Med changes as above.  BMET in 10 days with change in [place].  Follow up 1 month PA side.   Casimiro Needle [address]" [place], PA-C 03/02/2016 9:33 AM   Patient seen and examined with Otilio Saber, PA-C. We discussed all aspects of the encounter. I agree with the assessment and plan as stated above.   Continues to improve. Now NYHA II. EF nearly normalized. Volume status ok. Off carvedilol due to symptomatic bradycardia. Maintaining NSR on amio. Etiology of LV dysfunction unclear (?LBBB). Will increase Entresto. Consider decreasing ami to 100 daily at next visit. Continue wound care. Discussed possible need for CRT down the road - no indication at this point.  Oneal Schoenberger,MD 1:43 PM

## 2016-03-02 NOTE — Progress Notes (Addendum)
Advanced Heart Failure Medication Review by a Pharmacist  Does the patient  feel that his/her medications are working for him/her?  yes  Has the patient been experiencing any side effects to the medications prescribed?  no  Does the patient measure his/her own blood pressure or blood glucose at home?  yes   Does the patient have any problems obtaining medications due to transportation or finances?   no  Understanding of regimen: good Understanding of indications: good Potential of compliance: good Patient understands to avoid NSAIDs. Patient understands to avoid decongestants.  Issues to address at subsequent visits: None   Pharmacist comments:  Kelly Dickson is a pleasant 70 yo F presenting with her daughter and a current medication list. She reports good compliance with her regimen and did not have any specific medication-related questions or concerns for me at this time. Daughter thought she was still taking Entresto 24-26 mg BID but I verified with pharmacy that she filled the 49-51 mg strength on 3/28.   Tyler Deis. Bonnye Fava, PharmD, BCPS, CPP Clinical Pharmacist Pager: [phone] Phone: [phone] 03/02/2016 9:31 AM      Time with patient: 10 minutes Preparation and documentation time: 2 minutes Total time: 12 minutes

## 2016-03-03 ENCOUNTER — Encounter (HOSPITAL_COMMUNITY): Payer: Self-pay | Admitting: Internal Medicine

## 2016-03-04 ENCOUNTER — Encounter (HOSPITAL_BASED_OUTPATIENT_CLINIC_OR_DEPARTMENT_OTHER): Payer: Medicare Other

## 2016-03-09 ENCOUNTER — Telehealth (HOSPITAL_COMMUNITY): Payer: Self-pay | Admitting: Pharmacist

## 2016-03-09 ENCOUNTER — Telehealth (HOSPITAL_COMMUNITY): Payer: Self-pay

## 2016-03-09 ENCOUNTER — Ambulatory Visit (HOSPITAL_COMMUNITY)
Admission: RE | Admit: 2016-03-09 | Discharge: 2016-03-09 | Disposition: A | Discharge status: Home / Self Care | Payer: Medicare Other | Source: Ambulatory Visit | Attending: Internal Medicine | Admitting: Internal Medicine

## 2016-03-09 DIAGNOSIS — I5022 Chronic systolic (congestive) heart failure: Secondary | ICD-10-CM | POA: Insufficient documentation

## 2016-03-09 DIAGNOSIS — I5023 Acute on chronic systolic (congestive) heart failure: Secondary | ICD-10-CM

## 2016-03-09 LAB — BASIC METABOLIC PANEL
ANION GAP: 9 (ref 5–15)
BUN: 28 mg/dL — ABNORMAL HIGH (ref 6–20)
CALCIUM: 9.4 mg/dL (ref 8.9–10.3)
CO2: 26 mmol/L (ref 22–32)
Chloride: 106 mmol/L (ref 101–111)
Creatinine, Ser: 1.4 mg/dL — ABNORMAL HIGH (ref 0.44–1.00)
GFR, EST AFRICAN AMERICAN: 43 mL/min — AB (ref >60)
GFR, EST NON AFRICAN AMERICAN: 37 mL/min — AB (ref >60)
Glucose, Bld: 85 mg/dL (ref 65–99)
POTASSIUM: 4.6 mmol/L (ref 3.5–5.1)
Sodium: 141 mmol/L (ref 135–145)

## 2016-03-09 MED ORDER — SACUBITRIL-VALSARTAN 49-51 MG PO TABS: 1.0000 | ORAL_TABLET | Freq: Two times a day (BID) | ORAL | Status: DC

## 2016-03-09 NOTE — Telephone Encounter (Signed)
Spoke with Kelly Dickson (patient's daughter) who stated that after 24 hours of the higher Entresto 97/103 mg BID dosing, patient's BP dropped significantly and she felt "weird". She also was recently started on cipro for a pseudomonal wound infection which could also be making her feel off. Her daughter has had her to go back to the 49/51 mg BID dose and her BP is much improved (most recently SBP in the 112s). I told her to continue the lower dose and we would reassess at her next clinic visit in a couple of weeks.   Tyler Deis. Bonnye Fava, PharmD, BCPS, CPP Clinical Pharmacist Pager: [phone] Phone: [phone] 03/09/2016 4:05 PM

## 2016-03-09 NOTE — Telephone Encounter (Signed)
Daughter called CHF clinic triage concerned about possible drug interactions/adverse reactions/allergic reactions. Will forward to CHF clinical pharmacist Cicero Duck to review with patient and family.  Ave Filter

## 2016-03-13 ENCOUNTER — Other Ambulatory Visit (HOSPITAL_COMMUNITY): Payer: Self-pay | Admitting: Cardiology

## 2016-03-13 DIAGNOSIS — I509 Heart failure, unspecified: Secondary | ICD-10-CM

## 2016-03-16 ENCOUNTER — Other Ambulatory Visit (HOSPITAL_COMMUNITY): Payer: Self-pay | Admitting: Cardiology

## 2016-03-16 MED ORDER — SACUBITRIL-VALSARTAN 24-26 MG PO TABS: 1.0000 | ORAL_TABLET | Freq: Two times a day (BID) | ORAL | Status: DC

## 2016-03-20 ENCOUNTER — Ambulatory Visit (HOSPITAL_COMMUNITY)
Admission: RE | Admit: 2016-03-20 | Discharge: 2016-03-20 | Disposition: A | Discharge status: Home / Self Care | Payer: Medicare Other | Source: Ambulatory Visit | Attending: Internal Medicine | Admitting: Internal Medicine

## 2016-03-20 DIAGNOSIS — I5022 Chronic systolic (congestive) heart failure: Secondary | ICD-10-CM | POA: Insufficient documentation

## 2016-03-20 LAB — BASIC METABOLIC PANEL
Anion gap: 10 (ref 5–15)
BUN: 23 mg/dL — ABNORMAL HIGH (ref 6–20)
CALCIUM: 9.3 mg/dL (ref 8.9–10.3)
CO2: 27 mmol/L (ref 22–32)
CREATININE: 0.87 mg/dL (ref 0.44–1.00)
Chloride: 106 mmol/L (ref 101–111)
GFR calc Af Amer: >60 mL/min (ref >60)
GFR calc non Af Amer: >60 mL/min (ref >60)
GLUCOSE: 114 mg/dL — AB (ref 65–99)
Potassium: 4 mmol/L (ref 3.5–5.1)
Sodium: 143 mmol/L (ref 135–145)

## 2016-04-02 ENCOUNTER — Ambulatory Visit (HOSPITAL_COMMUNITY)
Admission: RE | Admit: 2016-04-02 | Discharge: 2016-04-02 | Disposition: A | Discharge status: Home / Self Care | Payer: Medicare Other | Source: Ambulatory Visit | Attending: Internal Medicine | Admitting: Internal Medicine

## 2016-04-02 VITALS — BP 156/64 | HR 60 | Wt 209.4 lb

## 2016-04-02 DIAGNOSIS — I272 Other secondary pulmonary hypertension: Secondary | ICD-10-CM | POA: Insufficient documentation

## 2016-04-02 DIAGNOSIS — Z79899 Other long term (current) drug therapy: Secondary | ICD-10-CM | POA: Diagnosis not present

## 2016-04-02 DIAGNOSIS — I447 Left bundle-branch block, unspecified: Secondary | ICD-10-CM | POA: Diagnosis not present

## 2016-04-02 DIAGNOSIS — I480 Paroxysmal atrial fibrillation: Secondary | ICD-10-CM

## 2016-04-02 DIAGNOSIS — I081 Rheumatic disorders of both mitral and tricuspid valves: Secondary | ICD-10-CM | POA: Insufficient documentation

## 2016-04-02 DIAGNOSIS — Z8249 Family history of ischemic heart disease and other diseases of the circulatory system: Secondary | ICD-10-CM | POA: Diagnosis not present

## 2016-04-02 DIAGNOSIS — Z7901 Long term (current) use of anticoagulants: Secondary | ICD-10-CM | POA: Insufficient documentation

## 2016-04-02 DIAGNOSIS — I5022 Chronic systolic (congestive) heart failure: Secondary | ICD-10-CM

## 2016-04-02 DIAGNOSIS — R0683 Snoring: Secondary | ICD-10-CM | POA: Diagnosis not present

## 2016-04-02 DIAGNOSIS — E785 Hyperlipidemia, unspecified: Secondary | ICD-10-CM

## 2016-04-02 DIAGNOSIS — I10 Essential (primary) hypertension: Secondary | ICD-10-CM | POA: Diagnosis not present

## 2016-04-02 DIAGNOSIS — I110 Hypertensive heart disease with heart failure: Secondary | ICD-10-CM | POA: Diagnosis not present

## 2016-04-02 MED ORDER — SACUBITRIL-VALSARTAN 49-51 MG PO TABS: 1.0000 | ORAL_TABLET | Freq: Two times a day (BID) | ORAL | Status: DC

## 2016-04-02 MED ORDER — AMIODARONE HCL 200 MG PO TABS: 100.0000 mg | ORAL_TABLET | Freq: Every day | ORAL | Status: DC

## 2016-04-02 NOTE — Progress Notes (Signed)
Patient ID: [name], female   DOB: [birth year], 70 y.o.   MRN: [id number]    Advanced Heart Failure Clinic Note   Primary Care: Dr Luiz Iron Primary Cardiologist: Dr Willeen Cass Primary HF: Dr. Gala Romney   HPI:  AJNA MATOS is a 70 y.o. female with Mod MR, Afib, HTN, and chronic systolic HF who presents to establish in HF clinic.   Problems started last year while taking care of her chronically ill son, who passed away this 2022-11-27. Has been unable to walk long distances for awhile. Summer of 2016 was having episodes of SOB, weakness, and diaphoresis that led to full cardiac work up as below.    Echo 12/31/15 EF 45-50%, Severe LAE, Mild AS, Mild MR  At last visit had wound on LLE that was debrided in clinic and started on doxycycline. She stated that the injury was from her previous hospitalization in 11-28-15.  It continued to worsen and she was given a referral to Wound Center but never heard back.  Her daughter self arranged wound care follow up in [facility] and they were seen the week 02/26/16.  She presents today for regular follow up. She is up 6 lbs since last visit. Still seeing wound care in [facility], being treated for psuedomonas infection. Is seen twice a week.  Breathing is "really good". Feels like she is doing much better.  Feels like she is getting around better.  Continues to improve as her leg is treated.  Denies CP or palpitations.   No lightheadedness or dizziness. No Orthopnea.  Weight at home stable 197 - 200. States it fluctuates and she doesn't need to take any extra lasix. No bleeding on eliquis.   LHC 06/27/15 - Dr Rolly Salter Willeen Cass - Right dominant anatomy - Left main - medium size and normal - LAD - medium caliber, 20% lesion in proximal portion - LCx - medium caliber, with few obtuse marginal branches but normal - RCA - large and dominant artery, without signifcant disease.   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 EF ~ 36%.  ROS: All systems reviewed and negative except as listed in HPI.   Past Medical History  Diagnosis Date  . Panic attack   . A-fib (HCC)   . CHF (congestive heart failure) (HCC)   . Essential hypertension   . MR (mitral regurgitation)   . TR (tricuspid regurgitation)   . Pulmonary hypertension (HCC)   . HLD (hyperlipidemia)     Current Outpatient Prescriptions  Medication Sig Dispense Refill  . acetaminophen (TYLENOL) 650 MG CR tablet Take 1,300 mg by mouth every 8 (eight) hours as needed for pain.    [name] acetaminophen-codeine (TYLENOL #3) 300-30 MG tablet Take 1 tablet by mouth every 8 (eight) hours as needed for moderate pain.    [name] amiodarone (PACERONE) 200 MG tablet Take 1 tablet (200 mg total) by mouth daily. 30 tablet 6  . apixaban (ELIQUIS) 5 MG TABS tablet Take 1 tablet (5 mg total) by mouth 2 (two) times daily. 60 tablet 6  . benzonatate (TESSALON PERLES) 100 MG capsule Take 100 mg by mouth daily as needed for cough. Reported on 03/02/2016    . Bioflavonoid Products (VITAMIN C/BIOFLAVONOIDS) 1000-25 MG TABS Take 1 tablet by mouth daily. Reported on 12/24/2015    .  Calcium Carbonate-Vitamin D (CALCIUM-VITAMIN D) 500-200 MG-UNIT tablet Take 1 tablet by mouth daily.    . Coenzyme Q10 (COQ10) 100 MG CAPS Take 100 mg by mouth daily.     . DULoxetine (CYMBALTA) 30 MG capsule Take 30 mg by mouth daily.    . fluticasone (FLONASE) 50 MCG/ACT nasal spray Place 1 spray into both nostrils 2 (two) times daily as needed for allergies or rhinitis. 16 g 0  . furosemide (LASIX) 40 MG tablet Take 1 tablet (40 mg total) by mouth daily. 30 tablet 6  . HYDROcodone-acetaminophen (NORCO) 5-325 MG tablet Take 1 tablet by mouth every 6 (six) hours as needed for severe pain. 10 tablet 0  . lactobacillus acidophilus & bulgar (LACTINEX) chewable tablet Chew 3  tablets by mouth at bedtime.     [name] LECITHIN PO Take 2 tablets by mouth 2 (two) times daily.     [name] loratadine (CLARITIN) 10 MG tablet Take 10 mg by mouth daily as needed for allergies.    . Multiple Vitamins-Minerals (MULTIVITAMIN WITH MINERALS) tablet Take 1 tablet by mouth daily.    . Omega-3 Fatty Acids (FISH OIL) 1000 MG CAPS Take 1,000 mg by mouth daily.     . Polyvinyl Alcohol-Povidone (MURINE TEARS FOR DRY EYES) 5-6 MG/ML SOLN Apply 1-2 drops to eye as needed (for dry eyes and allergies).    . Red Yeast Rice 600 MG CAPS Take 600 mg by mouth daily.     . sacubitril-valsartan (ENTRESTO) 24-26 MG Take 1 tablet by mouth 2 (two) times daily. 60 tablet 6  . spironolactone (ALDACTONE) 25 MG tablet Take 1 tablet (25 mg total) by mouth daily at 12 noon. 30 tablet 6  . vitamin E 400 UNIT capsule Take 400 Units by mouth daily.     [name] doxycycline (DORYX) 100 MG EC tablet Take 100 mg by mouth 2 (two) times daily. Reported on 04/02/2016     No current facility-administered medications for this encounter.    No Known Allergies    Social History   Social History  . Marital Status: Divorced    Spouse Name: N/A  . Number of Children: N/A  . Years of Education: N/A   Occupational History  . Not on file.   Social History Main Topics  . Smoking status: Never Smoker   . Smokeless tobacco: Not on file  . Alcohol Use: No  . Drug Use: No  . Sexual Activity: Not on file   Other Topics Concern  . Not on file   Social History Narrative      Family History  Problem Relation Age of Onset  . Breast cancer Mother   . Hypertension Sister     Ceasar Mons Vitals:   04/02/16 1452  BP: 156/64  Pulse: 60  Weight: 209 lb 6.4 oz (94.983 kg)  SpO2: 99%     Wt Readings from Last 3 Encounters:  04/02/16 209 lb 6.4 oz (94.983 kg)  03/02/16 203 lb (92.08 kg)  01/28/16 203 lb 6.4 oz (92.262 kg)    PHYSICAL EXAM: General:  Elderly appearing. No respiratory difficulty HEENT: normal Neck: supple. JVD  6-7 cm. Carotids 2+ bilat; no bruits. No thyromegaly or nodule noted.  Cor: PMI nondisplaced. Regular, Split s2. No rubs, gallops or murmurs appreciated. Lungs: Clear Abdomen: soft, NT, ND, no HSM. No bruits or masses. +BS  Extremities: no cyanosis, clubbing. No edema. LLE wrapped with ACE bandage.  Neuro: alert & oriented x 3, cranial nerves grossly intact. moves all  4 extremities w/o difficulty. Affect pleasant.  ECG: Sinus brady 53. LBBB 164 ms  ASSESSMENT & PLAN:  1. Chronic systolic HF - Echo 12/31/15 EF 47-82%, Severe LAE, Mild AS, Mild MR - NYHA II. Volume status stable on exam, encouraged to continue compression stockings. EF improved on recent echo.  - No Coreg with bradycardia - Incresase Entresto to 49/51 BID. BMET in 10 days.  - Encouraged to continue daily weights and limit fluid and salt.  Knows to call HF clinic with weight gains of 3 lbs overnight or 5 lbs within one week.  - Encouraged compression stocking leg w/o wound as well.  2. Paroxysmal Afib S/p DCCV 12/25/15 - EKG shows sinus bradycardia at 53 bpm. - She is on chronic anticoag with Eliquis 5 mg BID.  - Decrease amiodarone to 100 mg daily.  LFTs and TSHs normal 01/28/16. Will recheck in 10 days with BMET.  3. Mod MR/Mod TR  - Both now showing as mild on more recent echo.  4. 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 TR Max PG 55.5 mm Hg by previous echo  5. HLD - Per PCP 6. Snoring - Sleep study scheduled for the end of this month.  7. LBBB, 170 ms by echo today. - EF improved on formal echo.  If EF trends back down in the future, she may benefit from CRT-D given wide LBBB.  8. Tachy-brady - Remains brady after Afib. Off BB.    - If remains slow will need referal to EP for BiV pacemaker consideration. Continuing watchful waiting for now, if she were to become symptomatic at all but likely rapidly proceed to pacemaker.   Will likely need to wait until leg wound is under better control anyway.  9. Deconditioning -  Encouraged to resume Silver Sneakers once cleared by Wound Center.  10. LLE wound - Managed by wound clinic in [facility].  Follow up 2 month PA side.   Med changes as above.  BMET 10 days.   Casimiro Needle [address] [place], PA-C 04/02/2016 3:30 PM

## 2016-04-02 NOTE — Patient Instructions (Signed)
INCREASE Entresto to 49/51 mg, one tab twice a day DECREASE Amiodarone to 100 mg, one half tab daily  Your physician recommends that you schedule a follow-up appointment in: 2 months In the [facility]  Labs needed in 10 days

## 2016-04-13 ENCOUNTER — Other Ambulatory Visit (HOSPITAL_COMMUNITY): Payer: Medicare Other

## 2016-04-14 ENCOUNTER — Telehealth (HOSPITAL_COMMUNITY): Payer: Self-pay | Admitting: Vascular Surgery

## 2016-04-14 NOTE — Telephone Encounter (Signed)
Returned pt call  

## 2016-04-17 ENCOUNTER — Ambulatory Visit (HOSPITAL_COMMUNITY)
Admission: RE | Admit: 2016-04-17 | Discharge: 2016-04-17 | Disposition: A | Discharge status: Home / Self Care | Payer: Medicare Other | Source: Ambulatory Visit | Attending: Internal Medicine | Admitting: Internal Medicine

## 2016-04-17 DIAGNOSIS — I5022 Chronic systolic (congestive) heart failure: Secondary | ICD-10-CM | POA: Insufficient documentation

## 2016-04-17 LAB — HEPATIC FUNCTION PANEL
ALBUMIN: 3.4 g/dL — AB (ref 3.5–5.0)
ALK PHOS: 79 U/L (ref 38–126)
ALT: 13 U/L — ABNORMAL LOW (ref 14–54)
AST: 19 U/L (ref 15–41)
BILIRUBIN INDIRECT: 0.5 mg/dL (ref 0.3–0.9)
Bilirubin, Direct: 0.1 mg/dL (ref 0.1–0.5)
TOTAL PROTEIN: 6.7 g/dL (ref 6.5–8.1)
Total Bilirubin: 0.6 mg/dL (ref 0.3–1.2)

## 2016-04-17 LAB — BASIC METABOLIC PANEL
Anion gap: 7 (ref 5–15)
BUN: 19 mg/dL (ref 6–20)
CHLORIDE: 103 mmol/L (ref 101–111)
CO2: 30 mmol/L (ref 22–32)
CREATININE: 0.75 mg/dL (ref 0.44–1.00)
Calcium: 9.4 mg/dL (ref 8.9–10.3)
Glucose, Bld: 105 mg/dL — ABNORMAL HIGH (ref 65–99)
Potassium: 4.4 mmol/L (ref 3.5–5.1)
SODIUM: 140 mmol/L (ref 135–145)

## 2016-04-17 LAB — TSH: TSH: 3.663 u[IU]/mL (ref 0.350–4.500)

## 2016-04-30 ENCOUNTER — Ambulatory Visit (HOSPITAL_BASED_OUTPATIENT_CLINIC_OR_DEPARTMENT_OTHER): Payer: Medicare Other | Attending: Internal Medicine | Admitting: Cardiovascular Disease

## 2016-04-30 VITALS — Ht 62.0 in | Wt 195.0 lb

## 2016-04-30 DIAGNOSIS — Z79899 Other long term (current) drug therapy: Secondary | ICD-10-CM | POA: Insufficient documentation

## 2016-04-30 DIAGNOSIS — I493 Ventricular premature depolarization: Secondary | ICD-10-CM | POA: Diagnosis not present

## 2016-04-30 DIAGNOSIS — G4736 Sleep related hypoventilation in conditions classified elsewhere: Secondary | ICD-10-CM | POA: Diagnosis not present

## 2016-04-30 DIAGNOSIS — R0683 Snoring: Secondary | ICD-10-CM

## 2016-04-30 DIAGNOSIS — Z7901 Long term (current) use of anticoagulants: Secondary | ICD-10-CM | POA: Diagnosis not present

## 2016-04-30 DIAGNOSIS — G4733 Obstructive sleep apnea (adult) (pediatric): Secondary | ICD-10-CM | POA: Insufficient documentation

## 2016-05-06 ENCOUNTER — Encounter (HOSPITAL_BASED_OUTPATIENT_CLINIC_OR_DEPARTMENT_OTHER): Payer: Self-pay | Admitting: Cardiovascular Disease

## 2016-05-06 NOTE — Procedures (Signed)
Patient Name: Kelly Dickson, [place] Date: 04/30/2016 Gender: Female D.O.B: [birth year] Age (years): 73 Referring Provider: Bevelyn Buckles Bensimhon Height (inches): 62 Interpreting Physician: Nicki Guadalajara MD, ABSM Weight (lbs): 195 RPSGT: Rolene Arbour BMI: 36 MRN: [id number] Neck Size: 14.00  CLINICAL INFORMATION Sleep Study Type: NPSG Indication for sleep study: Snoring Epworth Sleepiness Score: 11  SLEEP STUDY TECHNIQUE As per the AASM Manual for the Scoring of Sleep and Associated Events v2.3 (April 2016) with a hypopnea requiring 4% desaturations. The channels recorded and monitored were frontal, central and occipital EEG, electrooculogram (EOG), submentalis EMG (chin), nasal and oral airflow, thoracic and abdominal wall motion, anterior tibialis EMG, snore microphone, electrocardiogram, and pulse oximetry.  MEDICATIONS  vitamin E 400 UNIT capsule 400 Units, Daily     spironolactone (ALDACTONE) 25 MG tablet 25 mg, Daily     sacubitril-valsartan (ENTRESTO) 49-51 MG 1 tablet, 2 times daily     Red Yeast Rice 600 MG CAPS 600 mg, Daily     Polyvinyl Alcohol-Povidone (MURINE TEARS FOR DRY EYES) 5-6 MG/ML SOLN 1-2 drop, As  needed     Omega-3 Fatty Acids (FISH OIL) 1000 MG CAPS 1,000 mg, Daily     Multiple Vitamins-Minerals (MULTIVITAMIN WITH MINERALS) tablet 1 tablet, Daily     loratadine (CLARITIN) 10 MG tablet 10 mg, Daily PRN     LECITHIN PO 2 tablet, 2 times daily     lactobacillus acidophilus & bulgar (LACTINEX) chewable tablet 3 tablet, Daily at bedtime     HYDROcodone-acetaminophen (NORCO) 5-325 MG tablet 1 tablet, Every 6 hours PRN     furosemide (LASIX) 40 MG tablet 40 mg, Daily     fluticasone (FLONASE) 50 MCG/ACT nasal spray 1 spray, 2 times daily PRN     DULoxetine (CYMBALTA) 30 MG capsule 30 mg, Daily     doxycycline (DORYX) 100 MG EC tablet 100 mg, 2 times daily     Coenzyme Q10 (COQ10) 100 MG CAPS 100 mg, Daily     Calcium Carbonate-Vitamin D  (CALCIUM-VITAMIN D) 500-200 MG-UNIT tablet 1 tablet, Daily     Bioflavonoid Products (VITAMIN C/BIOFLAVONOIDS) 1000-25 MG TABS 1 tablet, Daily     benzonatate (TESSALON PERLES) 100 MG capsule 100 mg, Daily PRN     apixaban (ELIQUIS) 5 MG TABS tablet 5 mg, 2 times daily     amiodarone (PACERONE) 200 MG tablet 100 mg, Daily     acetaminophen-codeine (TYLENOL #3) 300-30 MG tablet 1 tablet, Every 8 hours PRN     acetaminophen (TYLENOL) 650 MG CR tablet 1,300 mg, Every 8 hours PRN   Medications self-administered by patient during sleep study : No sleep medicine administered.  SLEEP ARCHITECTURE The study was initiated at 10:54:45 PM and ended at 5:02:20 AM. Sleep onset time was 28.3 minutes and the sleep efficiency was 30.2%. The total sleep time was 111.0 minutes. Wake after sleep onset (WASO) was 228.3 minutes.  Stage REM latency was N/A minutes. The patient spent 7.66% of the night in stage N1 sleep, 92.34% in stage N2 sleep, 0.00% in stage N3 and 0.00% in REM. Alpha intrusion was absent. Supine sleep was 100.00%.  RESPIRATORY PARAMETERS The overall apnea/hypopnea index (AHI) was 11.9 per hour. There were 0 total apneas, including 0 obstructive, 0 central and 0 mixed apneas. There were 22 hypopneas and 16 RERAs. The AHI during Stage REM sleep was N/A per hour. AHI while supine was 11.9 per hour. The mean oxygen saturation was 93.57%. The minimum SpO2 during sleep was 88.00%. Loud  snoring was noted during this study.  CARDIAC DATA The 2 lead EKG demonstrated sinus rhythm. The mean heart rate was 49.77 beats per minute. Other EKG findings include: PVCs.  LEG MOVEMENT DATA The total PLMS were 0 with a resulting PLMS index of 0.00. Associated arousal with leg movement index was 0.0 .  IMPRESSIONS - Mild obstructive sleep apnea occurred during this study (AHI = 11.9/h); however, there was absence of REM sleep and the severity of sleep apnea may be underestimated if REM sleep had  occurred. - No significant central sleep apnea occurred during this study (CAI = 0.0/h). - Mild oxygen desaturation to a nadir of 88.00%. - Very poor sleep efficiency at only 30.2%. - Abnormal sleep architecture with absence of slow wave and REM sleep. - The patient snored with Loud snoring volume. - EKG findings include PVCs. - Clinically significant periodic limb movements did not occur during sleep. No significant associated arousals.  DIAGNOSIS - Obstructive Sleep Apnea (327.23 [G47.33 ICD-10]) - Nocturnal Hypoxemia (327.26 [G47.36 ICD-10])  RECOMMENDATIONS - Recommend therapeutic CPAP titration to determine optimal pressure required to alleviate sleep disordered breathing in this patient with significant cardiovascular comorbidities. - Effort should be made to optimize nasal or pharyngeal patency. - The patient should be counseled to try avoiding supine sleep. - Avoid alcohol, sedatives and other CNS depressants that may worsen sleep apnea and disrupt normal sleep architecture. - Sleep hygiene should be reviewed to assess factors that may improve sleep quality. - Weight management (BMI 36) and regular exercise should be initiated or continued if appropriate.   Lennette Bihari, MD, [facility] Diplomate, American Board of Sleep Medicine  ELECTRONICALLY SIGNED ON:  05/06/2016, 1:18 PM [facility] PH: [phone]   FX: [phone] ACCREDITED BY THE AMERICAN ACADEMY OF SLEEP MEDICINE

## 2016-05-07 ENCOUNTER — Telehealth: Payer: Self-pay | Admitting: *Deleted

## 2016-05-07 DIAGNOSIS — G4730 Sleep apnea, unspecified: Secondary | ICD-10-CM

## 2016-05-07 NOTE — Telephone Encounter (Signed)
Message     Burna Mortimer, please set up for CPAP titration study   Spoke with pt, aware of results and need for further testing. Order for CPAP titration placed

## 2016-05-26 ENCOUNTER — Telehealth (HOSPITAL_COMMUNITY): Payer: Self-pay | Admitting: Vascular Surgery

## 2016-05-26 NOTE — Telephone Encounter (Signed)
Pt called to cancel appt 05/28/16, pt asked that I call her daughter [name] to get rescheduled

## 2016-05-28 ENCOUNTER — Encounter (HOSPITAL_COMMUNITY): Payer: Medicare Other

## 2016-06-11 ENCOUNTER — Ambulatory Visit (HOSPITAL_COMMUNITY)
Admission: RE | Admit: 2016-06-11 | Discharge: 2016-06-11 | Disposition: A | Discharge status: Home / Self Care | Payer: Medicare Other | Source: Ambulatory Visit | Attending: Internal Medicine | Admitting: Internal Medicine

## 2016-06-11 ENCOUNTER — Ambulatory Visit (HOSPITAL_BASED_OUTPATIENT_CLINIC_OR_DEPARTMENT_OTHER): Payer: Medicare Other | Attending: Cardiovascular Disease | Admitting: Cardiovascular Disease

## 2016-06-11 VITALS — BP 158/74 | HR 58 | Wt 213.4 lb

## 2016-06-11 DIAGNOSIS — Z79899 Other long term (current) drug therapy: Secondary | ICD-10-CM | POA: Insufficient documentation

## 2016-06-11 DIAGNOSIS — Z7901 Long term (current) use of anticoagulants: Secondary | ICD-10-CM | POA: Insufficient documentation

## 2016-06-11 DIAGNOSIS — R001 Bradycardia, unspecified: Secondary | ICD-10-CM | POA: Diagnosis not present

## 2016-06-11 DIAGNOSIS — E785 Hyperlipidemia, unspecified: Secondary | ICD-10-CM | POA: Insufficient documentation

## 2016-06-11 DIAGNOSIS — R0683 Snoring: Secondary | ICD-10-CM | POA: Diagnosis not present

## 2016-06-11 DIAGNOSIS — Z8249 Family history of ischemic heart disease and other diseases of the circulatory system: Secondary | ICD-10-CM | POA: Insufficient documentation

## 2016-06-11 DIAGNOSIS — I340 Nonrheumatic mitral (valve) insufficiency: Secondary | ICD-10-CM | POA: Diagnosis not present

## 2016-06-11 DIAGNOSIS — I447 Left bundle-branch block, unspecified: Secondary | ICD-10-CM | POA: Insufficient documentation

## 2016-06-11 DIAGNOSIS — I110 Hypertensive heart disease with heart failure: Secondary | ICD-10-CM | POA: Insufficient documentation

## 2016-06-11 DIAGNOSIS — I071 Rheumatic tricuspid insufficiency: Secondary | ICD-10-CM

## 2016-06-11 DIAGNOSIS — I442 Atrioventricular block, complete: Secondary | ICD-10-CM | POA: Insufficient documentation

## 2016-06-11 DIAGNOSIS — G4733 Obstructive sleep apnea (adult) (pediatric): Secondary | ICD-10-CM | POA: Diagnosis not present

## 2016-06-11 DIAGNOSIS — I493 Ventricular premature depolarization: Secondary | ICD-10-CM | POA: Insufficient documentation

## 2016-06-11 DIAGNOSIS — I480 Paroxysmal atrial fibrillation: Secondary | ICD-10-CM | POA: Diagnosis not present

## 2016-06-11 DIAGNOSIS — I10 Essential (primary) hypertension: Secondary | ICD-10-CM

## 2016-06-11 DIAGNOSIS — I272 Other secondary pulmonary hypertension: Secondary | ICD-10-CM | POA: Insufficient documentation

## 2016-06-11 DIAGNOSIS — I5032 Chronic diastolic (congestive) heart failure: Secondary | ICD-10-CM

## 2016-06-11 DIAGNOSIS — I5022 Chronic systolic (congestive) heart failure: Secondary | ICD-10-CM | POA: Insufficient documentation

## 2016-06-11 DIAGNOSIS — G4730 Sleep apnea, unspecified: Secondary | ICD-10-CM | POA: Diagnosis not present

## 2016-06-11 MED ORDER — SACUBITRIL-VALSARTAN 97-103 MG PO TABS: 1.0000 | ORAL_TABLET | Freq: Two times a day (BID) | ORAL | 6 refills | Status: DC

## 2016-06-11 NOTE — Patient Instructions (Signed)
Increase Entresto to 97/103 mg Twice daily   Labs in 2 weeks  We will contact you in 2-3 months to schedule your next appointment and echocardiogram

## 2016-06-11 NOTE — Progress Notes (Signed)
Patient ID: Kelly Dickson, female   DOB: Oct 15, 1946, 70 y.o.   MRN: [id number]    Advanced Heart Failure Clinic Note   Primary Care: Dr Luiz Iron Primary Cardiologist: Dr Willeen Cass Primary HF: Dr. Gala Romney   HPI:  Kelly Dickson is a 70 y.o. female with Mod MR, Afib, HTN, and chronic systolic HF who presents to establish in HF clinic.   Problems started last year while taking care of her chronically ill son, who passed away this 2022-12-06. Has been unable to walk long distances for awhile. Summer of 2016 was having episodes of SOB, weakness, and diaphoresis that led to full cardiac work up as below.    Echo 12/31/15 EF 45-50%, Severe LAE, Mild AS, Mild MR  At last visit had wound on LLE that was debrided in clinic and started on doxycycline. She stated that the injury was from her previous hospitalization in 12/07/2015.  It continued to worsen and she was given a referral to Wound Center but never heard back.  Her daughter self arranged wound care follow up in [facility] and they were seen the week 02/26/16.  She presents today for regular follow up. Weight is up 4 lbs from our last visit. She states she had several friends staying over last week and ate poorly. Has been doing good overall, although she did break her arm and wrist after a fall. Driving now.  Still following with [facility] and improving. Weight at home 204 lbs this morning.  Has not needed any extra fluid pills. Denies dizziness or lightheadedness. Sits on the edge of the bed to collect her self in when she gets up to pee in the night. No bleeding on Eliquis.   LHC 06/27/15 - Dr Rolly Salter Willeen Cass - Right dominant anatomy - Left main - medium size and normal - LAD - medium caliber, 20% lesion in proximal portion - LCx - medium caliber, with few obtuse marginal branches but normal - RCA - large and dominant artery, without signifcant disease.   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 EF ~ 36%.  ROS: All systems reviewed and negative except as listed in HPI.   Past Medical History:  Diagnosis Date  . A-fib (HCC)   . CHF (congestive heart failure) (HCC)   . Essential hypertension   . HLD (hyperlipidemia)   . MR (mitral regurgitation)   . Panic attack   . Pulmonary hypertension (HCC)   . TR (tricuspid regurgitation)     Current Outpatient Prescriptions  Medication Sig Dispense Refill  . acetaminophen (TYLENOL) 650 MG CR tablet Take 1,300 mg by mouth every 8 (eight) hours as needed for pain.    [name] acetaminophen-codeine (TYLENOL #3) 300-30 MG tablet Take 1 tablet by mouth every 8 (eight) hours as needed for moderate pain.    [name] amiodarone (PACERONE) 200 MG tablet Take 0.5 tablets (100 mg total) by mouth daily. 15 tablet 3  . apixaban (ELIQUIS) 5 MG TABS tablet Take 1 tablet (5 mg total) by mouth 2 (two) times daily. 60 tablet 6  . benzonatate (TESSALON PERLES) 100 MG capsule Take 100 mg by mouth daily as needed for cough. Reported on 03/02/2016    . Bioflavonoid Products (VITAMIN C/BIOFLAVONOIDS) 1000-25 MG TABS Take 1 tablet by mouth daily. Reported  on 12/24/2015    . Calcium Carbonate-Vitamin D (CALCIUM-VITAMIN D) 500-200 MG-UNIT tablet Take 1 tablet by mouth daily.    . Coenzyme Q10 (COQ10) 100 MG CAPS Take 100 mg by mouth daily.     [name] doxycycline (DORYX) 100 MG EC tablet Take 100 mg by mouth 2 (two) times daily. Reported on 04/02/2016    . DULoxetine (CYMBALTA) 30 MG capsule Take 30 mg by mouth daily.    . fluticasone (FLONASE) 50 MCG/ACT nasal spray Place 1 spray into both nostrils 2 (two) times daily as needed for allergies or rhinitis. 16 g 0  . furosemide (LASIX) 40 MG tablet Take 1 tablet (40 mg total) by mouth daily. 30 tablet 6  . HYDROcodone-acetaminophen (NORCO) 5-325 MG tablet Take 1 tablet by mouth every 6 (six) hours  as needed for severe pain. 10 tablet 0  . lactobacillus acidophilus & bulgar (LACTINEX) chewable tablet Chew 3 tablets by mouth at bedtime.     [name] LECITHIN PO Take 2 tablets by mouth 2 (two) times daily.     [name] loratadine (CLARITIN) 10 MG tablet Take 10 mg by mouth daily as needed for allergies.    . Multiple Vitamins-Minerals (MULTIVITAMIN WITH MINERALS) tablet Take 1 tablet by mouth daily.    . Omega-3 Fatty Acids (FISH OIL) 1000 MG CAPS Take 1,000 mg by mouth daily.     . Polyvinyl Alcohol-Povidone (MURINE TEARS FOR DRY EYES) 5-6 MG/ML SOLN Apply 1-2 drops to eye as needed (for dry eyes and allergies).    . Red Yeast Rice 600 MG CAPS Take 600 mg by mouth daily.     . sacubitril-valsartan (ENTRESTO) 49-51 MG Take 1 tablet by mouth 2 (two) times daily. 60 tablet 6  . spironolactone (ALDACTONE) 25 MG tablet Take 1 tablet (25 mg total) by mouth daily at 12 noon. 30 tablet 6  . vitamin E 400 UNIT capsule Take 400 Units by mouth daily.      No current facility-administered medications for this encounter.     No Known Allergies    Social History   Social History  . Marital status: Divorced    Spouse name: N/A  . Number of children: N/A  . Years of education: N/A   Occupational History  . Not on file.   Social History Main Topics  . Smoking status: Never Smoker  . Smokeless tobacco: Not on file  . Alcohol use No  . Drug use: No  . Sexual activity: Not on file   Other Topics Concern  . Not on file   Social History Narrative  . No narrative on file      Family History  Problem Relation Age of Onset  . Breast cancer Mother   . Hypertension Sister     Vitals:   06/11/16 1219  BP: (!) 158/74  Pulse: (!) 58  SpO2: 98%  Weight: 213 lb 6.4 oz (96.8 kg)     Wt Readings from Last 3 Encounters:  06/11/16 213 lb 6.4 oz (96.8 kg)  05/01/16 195 lb (88.5 kg)  04/02/16 209 lb 6.4 oz (95 kg)    PHYSICAL EXAM: General:  Elderly appearing. No respiratory difficulty HEENT:  normal Neck: supple. JVD 6-7 cm. Carotids 2+ bilat; no bruits. No thyromegaly or nodule noted.  Cor: PMI nondisplaced. Regular, Split s2. No rubs, gallops or murmurs appreciated. Lungs: Clear Abdomen: soft, NT, ND, no HSM. No bruits or masses. +BS  Extremities: no cyanosis, clubbing. No edema. LLE wrapped with ACE bandage.  Neuro: alert & oriented x 3, cranial nerves grossly intact. moves all 4 extremities w/o difficulty. Affect pleasant.  ECG: Sinus brady 53. LBBB 164 ms  ASSESSMENT & PLAN:  1. Chronic systolic HF - Echo 12/31/15 EF 82-42%, Severe LAE, Mild AS, Mild MR - NYHA II. Volume status stable on exam, encouraged to continue compression stockings. EF improved on recent echo.  - Off Coreg with bradycardia - Increase Entresto 97/103 mg BID. Will check CMET in 2 weeks with change. - Continue spironolactone 25 mg daily.  - Encouraged to continue daily weights and limit fluid and salt.  Knows to call HF clinic with weight gains of 3 lbs overnight or 5 lbs within one week.  - Encouraged compression stocking leg w/o wound as well.  2. Paroxysmal Afib S/p DCCV 12/25/15 - She is on chronic anticoag with Eliquis 5 mg BID.  - Continue amiodarone 100 mg daily.  LFTs and TSHs normal 01/28/16. Will recheck with repeat labs in 2 weeks. 3. Mod MR/Mod TR  - Both now showing as mild on more recent echo.  - Will recheck on upcoming echo.  4. 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 TR Max PG 55.5 mm Hg by previous echo  - Will re-evaluate on upcoming echo.  5. HLD - Per PCP 6. Snoring - Has CPAP titration tonight, 06/11/16 7. LBBB, 170 ms by echo today. - EF improved on formal echo.  If EF trends back down in the future, she may benefit from CRT-D given wide LBBB.  8. Tachy-brady - Remains brady after Afib. Off BB.    - Leg wound under better control. Remains bradycardic today.  Will consider EP referral after next visit with Echo.  9. Deconditioning - Encouraged to continue with Silver  Sneakers and increase activity as able.   10. LLE wound - Managed by wound clinic in [facility].  Follow up 2-3 months MD side with Echo. Increase Entresto as above.  CMET and TSH in 2 weeks.    Casimiro Needle [address] [place], PA-C 06/11/2016 12:30 PM

## 2016-06-17 NOTE — Procedures (Signed)
   Patient Name: Kelly Dickson, Kelly Dickson Date: 06/11/2016 Gender: Female D.O.B: [birth year] Age (years): 59 Referring Provider: Bevelyn Buckles Bensimhon Height (inches): 62 Interpreting Physician: Armanda Magic MD, ABSM Weight (lbs): 195 RPSGT: Cherylann Parr BMI: 36 MRN: [id number] Neck Size: 14.00  CLINICAL INFORMATION The patient is referred for a CPAP titration to treat sleep apnea. Date of NPSG, Split Night or HST:04/30/2016  SLEEP STUDY TECHNIQUE As per the AASM Manual for the Scoring of Sleep and Associated Events v2.3 (April 2016) with a hypopnea requiring 4% desaturations. The channels recorded and monitored were frontal, central and occipital EEG, electrooculogram (EOG), submentalis EMG (chin), nasal and oral airflow, thoracic and abdominal wall motion, anterior tibialis EMG, snore microphone, electrocardiogram, and pulse oximetry. Continuous positive airway pressure (CPAP) was initiated at the beginning of the study and titrated to treat sleep-disordered breathing.  MEDICATIONS Medications taken by the patient : Reviewed in the chart Medications administered by patient during sleep study : No sleep medicine administered.  TECHNICIAN COMMENTS Comments added by technician: Patient was restless all through the night. Patient had difficulty initiating sleep.  Comments added by scorer: N/A  RESPIRATORY PARAMETERS Optimal PAP Pressure (cm): 10  AHI at Optimal Pressure (/hr):0.0 Overall Minimal O2 (%):84.00  Supine % at Optimal Pressure (%):15 Minimal O2 at Optimal Pressure (%): 91.0    SLEEP ARCHITECTURE The study was initiated at 10:38:37 PM and ended at 4:49:06 AM. Sleep onset time was 2.8 minutes and the sleep efficiency was 58.8%. The total sleep time was 217.7 minutes. The patient spent 30.55% of the night in stage N1 sleep, 69.45% in stage N2 sleep, 0.00% in stage N3 and 0.00% in REM.Stage REM latency was N/A minutes Wake after sleep onset was 150.0. Alpha intrusion was  absent. Supine sleep was 68.30%.  CARDIAC DATA The 2 lead EKG demonstrated sinus rhythm. The mean heart rate was 51.20 beats per minute. Other EKG findings include: PVCs and idioventricular rhythm.  LEG MOVEMENT DATA The total Periodic Limb Movements of Sleep (PLMS) were 60. The PLMS index was 16.54. A PLMS index of <15 is considered normal in adults.  IMPRESSIONS - The optimal PAP pressure was 10 cm of water. - Central sleep apnea was not noted during this titration (CAI = 0.0/h). - Oxygen desaturations were observed during this titration (min O2 = 84.00%). - No snoring was audible during this study. - No cardiac abnormalities were observed during this study. - Mild periodic limb movements were observed during this study. Arousals associated with PLMs were significant.  DIAGNOSIS - Obstructive Sleep Apnea (327.23 [G47.33 ICD-10])  RECOMMENDATIONS - Trial of CPAP therapy on 10 cm H2O with a Large size Fisher&Paykel Nasal Mask Eson mask and heated humidification. - Avoid alcohol, sedatives and other CNS depressants that may worsen sleep apnea and disrupt normal sleep architecture. - Sleep hygiene should be reviewed to assess factors that may improve sleep quality. - Weight management and regular exercise should be initiated or continued. - Return to Sleep Center for re-evaluation after 10 weeks of therapy  Armanda Magic Diplomate, American Board of Sleep Medicine  ELECTRONICALLY SIGNED ON:  06/17/2016, 7:58 PM [facility] PH: [phone]   FX: [phone] ACCREDITED BY THE AMERICAN ACADEMY OF SLEEP MEDICINE

## 2016-06-19 NOTE — Progress Notes (Signed)
Patient informed of information.  Stated verbal understanding. Okay to proceed with CPAP.    Message sent to [facility] for set up.  Once patient is set up on machine, I will scedhule 10 week follow-up visit.

## 2016-07-03 ENCOUNTER — Other Ambulatory Visit (HOSPITAL_COMMUNITY): Payer: Self-pay | Admitting: Student

## 2016-07-04 LAB — COMPREHENSIVE METABOLIC PANEL
A/G RATIO: 1.6 (ref 1.2–2.2)
ALBUMIN: 4.3 g/dL (ref 3.5–4.8)
ALT: 10 IU/L (ref 0–32)
AST: 15 IU/L (ref 0–40)
Alkaline Phosphatase: 92 IU/L (ref 39–117)
BUN/Creatinine Ratio: 24 (ref 12–28)
BUN: 16 mg/dL (ref 8–27)
Bilirubin Total: 0.3 mg/dL (ref 0.0–1.2)
CALCIUM: 9.5 mg/dL (ref 8.7–10.3)
CO2: 26 mmol/L (ref 18–29)
Chloride: 101 mmol/L (ref 96–106)
Creatinine, Ser: 0.67 mg/dL (ref 0.57–1.00)
GFR, EST AFRICAN AMERICAN: 103 mL/min/{1.73_m2} (ref >59)
GFR, EST NON AFRICAN AMERICAN: 89 mL/min/{1.73_m2} (ref >59)
GLOBULIN, TOTAL: 2.7 g/dL (ref 1.5–4.5)
Glucose: 110 mg/dL — ABNORMAL HIGH (ref 65–99)
Potassium: 4.3 mmol/L (ref 3.5–5.2)
Sodium: 144 mmol/L (ref 134–144)
TOTAL PROTEIN: 7 g/dL (ref 6.0–8.5)

## 2016-07-04 LAB — AMBIG ABBREV CMP14 DEFAULT

## 2016-07-04 LAB — TSH: TSH: 3.1 u[IU]/mL (ref 0.450–4.500)

## 2016-07-10 ENCOUNTER — Other Ambulatory Visit (HOSPITAL_COMMUNITY): Payer: Self-pay | Admitting: Vascular Surgery

## 2016-08-08 ENCOUNTER — Other Ambulatory Visit (HOSPITAL_COMMUNITY): Payer: Self-pay | Admitting: Internal Medicine

## 2016-08-18 ENCOUNTER — Encounter: Payer: Self-pay | Admitting: Cardiology

## 2016-09-10 ENCOUNTER — Other Ambulatory Visit (HOSPITAL_COMMUNITY): Payer: Self-pay | Admitting: Student

## 2016-09-10 DIAGNOSIS — I5022 Chronic systolic (congestive) heart failure: Secondary | ICD-10-CM

## 2016-10-09 ENCOUNTER — Other Ambulatory Visit (HOSPITAL_COMMUNITY): Payer: Self-pay | Admitting: Student

## 2016-10-12 ENCOUNTER — Other Ambulatory Visit (HOSPITAL_COMMUNITY): Payer: Self-pay | Admitting: Internal Medicine

## 2016-10-14 ENCOUNTER — Ambulatory Visit (INDEPENDENT_AMBULATORY_CARE_PROVIDER_SITE_OTHER): Payer: Medicare Other | Admitting: Cardiovascular Disease

## 2016-10-14 ENCOUNTER — Encounter: Payer: Self-pay | Admitting: Cardiovascular Disease

## 2016-10-14 VITALS — BP 147/50 | HR 56 | Ht 64.0 in | Wt 213.0 lb

## 2016-10-14 DIAGNOSIS — I5042 Chronic combined systolic (congestive) and diastolic (congestive) heart failure: Secondary | ICD-10-CM

## 2016-10-14 DIAGNOSIS — I480 Paroxysmal atrial fibrillation: Secondary | ICD-10-CM

## 2016-10-14 DIAGNOSIS — G4733 Obstructive sleep apnea (adult) (pediatric): Secondary | ICD-10-CM

## 2016-10-14 DIAGNOSIS — I10 Essential (primary) hypertension: Secondary | ICD-10-CM | POA: Diagnosis not present

## 2016-10-14 NOTE — Patient Instructions (Signed)
Your physician recommends that you schedule a follow-up appointment in:   1.) 1 month sleep clinic appointment with Dr Tresa Endo.

## 2016-10-20 NOTE — Progress Notes (Signed)
Cardiology Office Note    Date:  10/20/2016   ID:  Kelly Dickson, DOB July 01, 1946, MRN [id number]  PCP:  Kelly Channel, MD  Cardiologist:  Kelly Majestic, MD (sleep); Dr. Jeffie Dickson; Dr. Gerarda Dickson  Initial sleep evaluation following initiation of CPAP therapy.  History of Present Illness:  Kelly Dickson is a 70 y.o. female is followed by Dr. Jeffie Dickson in the advanced heart failure clinic.  Ms. Kelly Dickson has a history of proximal atrial fibrillation, hypertension, moderate mitral regurgitation, and chronic systolic heart failure.  In 2016.  Ejection fraction was 25-30%.  Cardiac catheterization August 2016 showed mild nonobstructive CAD.  On medical therapy.  Her echo Doppler study and from her 2017 showed improvement of LV function with an EF of 45-50%, with severe left atrial enlargement, mild aortic stenosis, mild mitral regurgitation.  Referred for a diagnostic sleep study, which was interpreted by me on 05/06/2016.  She had markedly reduced sleep efficiency at 30.2%.  She had mild obstructive sleep apnea overall with an HI of 11.9 per hour; however, there was absence of from sleep in the severity of sleep apnea was felt most likely to be underestimated) sleep had occurred.  There was mild oxygen desaturation to a nadir of 88%.  Sleep architecture with absence of slow-wave in rem sleep.  There was loud snoring.  Acutely with her cardiovascular comorbidities, she was referred for CPAP titration trial.  This was done in August 2017 and was felt that her optimal CPAP pressure was 10 cm water pressure.  Her sleep efficiency was improved but still reduced at 58.8%.  She sagittally was initiated on CPAP therapy and her DME company.  His advance home care.  In the office today.  I obtained 3 download from 07/16/2016 through 10/13/2016; from 08/15/2016 3-D 05/13/2016; and over the last month from November 13.  10/13/2016.  On all download.  She is meeting compliance with reference to usage stays ranging from  83-88%.  However, she is not compliant with reference to usage stays greater than 4 hours at only 43%.  She was averaging rest under 4 hours of sleep per night with CPAP therapy.  She states she goes to bed at 11:30 and wakes up at 8:30.  She has had some stress issues with her sister dying from cancer which has affected her sleep ability and may contribute to why she  has not been sleeping all night.  Oftentimes, she has gotten out of bed has gotten up out of bed.  She is unaware of any nocturnal chest pain, PND, or nocturnal arrhythmias.  He presents for evaluation.  Past Medical History:  Diagnosis Date  . A-fib ([place])   . CHF (congestive heart failure) ([place])   . Essential hypertension   . HLD (hyperlipidemia)   . MR (mitral regurgitation)   . Panic attack   . Pulmonary hypertension   . TR (tricuspid regurgitation)     Past Surgical History:  Procedure Laterality Date  . CARDIOVERSION N/A 12/25/2015   Procedure: CARDIOVERSION;  Surgeon: Jolaine Artist, MD;  Location: [facility] ENDOSCOPY;  Service: Cardiovascular;  Laterality: N/A;  . HERNIA REPAIR    . REPLACEMENT TOTAL KNEE    . TONSILLECTOMY      Current Medications: Outpatient Medications Prior to Visit  Medication Sig Dispense Refill  . acetaminophen (TYLENOL) 650 MG CR tablet Take 1,300 mg by mouth every 8 (eight) hours as needed for pain.    [name] amiodarone (PACERONE) 200 MG tablet TAKE 1/2 TABLET BY  MOUTH DAILY 15 tablet 3  . benzonatate (TESSALON PERLES) 100 MG capsule Take 100 mg by mouth daily as needed for cough. Reported on 03/02/2016    . Bioflavonoid Products (VITAMIN C/BIOFLAVONOIDS) 1000-25 MG TABS Take 1 tablet by mouth daily. Reported on 12/24/2015    . Calcium Carbonate-Vitamin D (CALCIUM-VITAMIN D) 500-200 MG-UNIT tablet Take 1 tablet by mouth daily.    . Coenzyme Q10 (COQ10) 100 MG CAPS Take 100 mg by mouth daily.     . DULoxetine (CYMBALTA) 30 MG capsule Take 30 mg by mouth daily.    [name] ELIQUIS 5 MG TABS tablet TAKE 1  TABLET BY MOUTH 2 TIMES A DAY 60 tablet 3  . fluticasone (FLONASE) 50 MCG/ACT nasal spray Place 1 spray into both nostrils 2 (two) times daily as needed for allergies or rhinitis. 16 g 0  . furosemide (LASIX) 40 MG tablet TAKE 1 TABLET BY MOUTH EVERY DAY 30 tablet 3  . lactobacillus acidophilus & bulgar (LACTINEX) chewable tablet Chew 3 tablets by mouth at bedtime.     [name] LECITHIN PO Take 2 tablets by mouth 2 (two) times daily.     [name] loratadine (CLARITIN) 10 MG tablet Take 10 mg by mouth daily as needed for allergies.    . Multiple Vitamins-Minerals (MULTIVITAMIN WITH MINERALS) tablet Take 1 tablet by mouth daily.    . Omega-3 Fatty Acids (FISH OIL) 1000 MG CAPS Take 1,000 mg by mouth daily.     . Polyvinyl Alcohol-Povidone (MURINE TEARS FOR DRY EYES) 5-6 MG/ML SOLN Apply 1-2 drops to eye as needed (for dry eyes and allergies).    . Red Yeast Rice 600 MG CAPS Take 600 mg by mouth daily.     . sacubitril-valsartan (ENTRESTO) 97-103 MG Take 1 tablet by mouth 2 (two) times daily. 60 tablet 6  . spironolactone (ALDACTONE) 25 MG tablet TAKE 1 TABLET BY MOUTH EVERY DAY AT NOON 30 tablet 3  . vitamin E 400 UNIT capsule Take 400 Units by mouth daily.     [name] acetaminophen-codeine (TYLENOL #3) 300-30 MG tablet Take 1 tablet by mouth every 8 (eight) hours as needed for moderate pain.     No facility-administered medications prior to visit.      Allergies:   Patient has no known allergies.   Social History   Social History  . Marital status: Divorced    Spouse name: N/A  . Number of children: N/A  . Years of education: N/A   Social History Main Topics  . Smoking status: Never Smoker  . Smokeless tobacco: None  . Alcohol use No  . Drug use: No  . Sexual activity: Not Asked   Other Topics Concern  . None   Social History Narrative  . None   She is divorced and has 4 children.  Unfortunately, her son died with heart failure.  Family History:  The patient's family history includes Breast  cancer in her mother; Hypertension in her sister.  A sister is dying from cancer.  ROS General: Negative; No fevers, chills, or night sweats;  HEENT: Negative; No changes in vision or hearing, sinus congestion, difficulty swallowing Pulmonary: Negative; No cough, wheezing, shortness of breath, hemoptysis Cardiovascular: see history of present illness GI: Negative; No nausea, vomiting, diarrhea, or abdominal pain GU: Negative; No dysuria, hematuria, or difficulty voiding Musculoskeletal: Negative; no myalgias, joint pain, or weakness Hematologic/Oncology: Negative; no easy bruising, bleeding Endocrine: Negative; no heat/cold intolerance; no diabetes Neuro: Negative; no changes in balance, headaches Skin: Negative;  No rashes or skin lesions Psychiatric: Negative; No behavioral problems, depression Sleep : positive for obstructive sleep apnea.  History of snoring, hypersomnolence, bruxism, restless legs, hypnogognic hallucinations, no cataplexy Other comprehensive 14 point system review is negative.   PHYSICAL EXAM:   VS:  BP (!) 147/50   Pulse (!) 56   Ht 5' 4"  (1.626 m)   Wt 213 lb (96.6 kg)   BMI 36.56 kg/m     Repeat blood pressure by me was 130/60.  Wt Readings from Last 3 Encounters:  10/14/16 213 lb (96.6 kg)  06/11/16 213 lb 6.4 oz (96.8 kg)  06/11/16 195 lb (88.5 kg)    General: Alert, oriented, no distress.  Skin: normal turgor, no rashes, warm and dry HEENT: Normocephalic, atraumatic. Pupils equal round and reactive to light; sclera anicteric; extraocular muscles intact; Fundi without hemorrhages or exudates.  Disc flat. Nose without nasal septal hypertrophy Mouth/Parynx benign; Mallinpatti scale 3 Neck: No JVD, no carotid bruits; normal carotid upstroke Lungs: clear to ausculatation and percussion; no wheezing or rales Chest wall: without tenderness to palpitation Heart: PMI not displaced, RRR, s1 s2 normal, 1/6 systolic murmur, no diastolic murmur, no rubs,  gallops, thrills, or heaves Abdomen: soft, nontender; no hepatosplenomehaly, BS+; abdominal aorta nontender and not dilated by palpation. Back: no CVA tenderness Pulses 2+ Musculoskeletal: full range of motion, normal strength, no joint deformities Extremities: no clubbing cyanosis or edema, Homan's sign negative  Neurologic: grossly nonfocal; Cranial nerves grossly wnl Psychologic: Normal mood and affect   Studies/Labs Reviewed:   ECG (independently read by me): Sinus bradycardia 56 bpm.  Left bundle branch block with repolarization changes.  Recent Labs: BMP Latest Ref Rng & Units 07/03/2016 04/17/2016 03/20/2016  Glucose 65 - 99 mg/dL 110(H) 105(H) 114(H)  BUN 8 - 27 mg/dL 16 19 23(H)  Creatinine 0.57 - 1.00 mg/dL 0.67 0.75 0.87  BUN/Creat Ratio 12 - 28 24 - -  Sodium 134 - 144 mmol/L 144 140 143  Potassium 3.5 - 5.2 mmol/L 4.3 4.4 4.0  Chloride 96 - 106 mmol/L 101 103 106  CO2 18 - 29 mmol/L 26 30 27   Calcium 8.7 - 10.3 mg/dL 9.5 9.4 9.3     Hepatic Function Latest Ref Rng & Units 07/03/2016 04/17/2016 01/28/2016  Total Protein 6.0 - 8.5 g/dL 7.0 6.7 7.2  Albumin 3.5 - 4.8 g/dL 4.3 3.4(L) 3.8  AST 0 - 40 IU/L 15 19 29   ALT 0 - 32 IU/L 10 13(L) 27  Alk Phosphatase 39 - 117 IU/L 92 79 77  Total Bilirubin 0.0 - 1.2 mg/dL 0.3 0.6 0.7  Bilirubin, Direct 0.1 - 0.5 mg/dL - 0.1 -    CBC Latest Ref Rng & Units 02/20/2016 11/28/2015 11/27/2015  WBC 4.0 - 10.5 K/uL 6.4 6.9 5.7  Hemoglobin 12.0 - 15.0 g/dL 12.0 11.6(L) 10.3(L)  Hematocrit 36.0 - 46.0 % 36.2 36.6 31.7(L)  Platelets 150 - 400 K/uL 214 195 144(L)   Lab Results  Component Value Date   MCV 92.6 02/20/2016   MCV 98.1 11/28/2015   MCV 98.1 11/27/2015   Lab Results  Component Value Date   TSH 3.100 07/03/2016   No results found for: HGBA1C   BNP    Component Value Date/Time   BNP 854.0 (H) 11/25/2015 2234    ProBNP No results found for: PROBNP   Lipid Panel  No results found for: CHOL, TRIG, HDL, CHOLHDL,  VLDL, LDLCALC, LDLDIRECT   RADIOLOGY: No results found.   Additional studies/ records  that were reviewed today include:  I reviewed the patient's advanced heart failure notes, diagnostic polysomnogram, CPAP titration trial, and 3 downloads since initiating CPAP therapy.    ASSESSMENT:    1. OSA (obstructive sleep apnea)   2. PAF (paroxysmal atrial fibrillation) ([place])   3. Essential hypertension   4. Chronic combined systolic and diastolic CHF (congestive heart failure) ([place])      PLAN:  Ms. Satomi Buda is a very pleasant 70 year old female who is here with her daughter in the office today following initiation of CPAP therapy.  She has a history of nonischemic myopathy with previous LV dysfunction at 25%.  With medical therapy consisting of intestinal, spironolactone, diuretic therapy, and amiodarone, she is maintaining sinus rhythm and her LV function has improved.  She has a history of PAF and has been on anticoagulation.  I had a long discussion with both she and her daughter concerning her sleep studies, which confirmed the diagnosis of obstructive sleep apnea.  Although she was only found to have mild sleep apnea.  Overall, I suspect she may potentially have more significant sleep apnea which was undetected due to her reduced sleep efficiency and absence of REM sleep.  Long discussion, and thoroughly reviewed adverse consequences of untreated sleep apnea.  I discussed with her the importance of using CPAP therapy for the entire duration of night.  She has not been achieving REM sleep and I suspect this may be due to the fact that she has not been using CPAP therapy during the second phase of night when the greatest preponderance of REN sleep occurs.  She is meeting compliance with reference to usage days, but will need to improve compliance with reference to duration of use per night.  Her sleep efficiency has been affected by her sisters severe illness  Ideally, she should be using CPAP  for 8 hours per night.  I discussed cleansing techniques.  I discussed the need for a  follow-up download and the need for a face-to-face evaluation to ocular compliance per Medicare regulations.  I will see her in one month for reevaluation.   Medication Adjustments/Labs and Tests Ordered: Current medicines are reviewed at length with the patient today.  Concerns regarding medicines are outlined above.  Medication changes, Labs and Tests ordered today are listed in the Patient Instructions below. Patient Instructions  Your physician recommends that you schedule a follow-up appointment in:   1.) 1 month sleep clinic appointment with Dr Claiborne Billings.    Time spent: 30 minutes Signed, Kelly Majestic, MD  10/20/2016 4:50 PM    [facility] [address], [place], [place], [place]  [address] Phone: [phone]

## 2016-11-10 ENCOUNTER — Other Ambulatory Visit (HOSPITAL_COMMUNITY): Payer: Self-pay | Admitting: Internal Medicine

## 2016-11-17 ENCOUNTER — Encounter (HOSPITAL_COMMUNITY): Payer: Self-pay | Admitting: Internal Medicine

## 2016-11-26 ENCOUNTER — Ambulatory Visit (INDEPENDENT_AMBULATORY_CARE_PROVIDER_SITE_OTHER): Payer: Medicare Other | Admitting: Cardiovascular Disease

## 2016-11-26 VITALS — BP 162/72 | HR 50 | Ht 63.0 in | Wt 209.0 lb

## 2016-11-26 DIAGNOSIS — I480 Paroxysmal atrial fibrillation: Secondary | ICD-10-CM

## 2016-11-26 DIAGNOSIS — G4733 Obstructive sleep apnea (adult) (pediatric): Secondary | ICD-10-CM

## 2016-11-26 DIAGNOSIS — I10 Essential (primary) hypertension: Secondary | ICD-10-CM | POA: Diagnosis not present

## 2016-11-26 DIAGNOSIS — I5042 Chronic combined systolic (congestive) and diastolic (congestive) heart failure: Secondary | ICD-10-CM | POA: Diagnosis not present

## 2016-11-26 NOTE — Patient Instructions (Addendum)
Your physician recommends that you schedule a follow-up appointment in 1 year. With Dr Tresa Endo for sleep.

## 2016-11-27 NOTE — Progress Notes (Signed)
Cardiology Office Note    Date:  11/27/2016   ID:  Kelly, Dickson November 15, 1945, MRN [id number]  PCP:  [name], MD  Cardiologist:  [name], MD (sleep); Dr. [name]; Dr. [name]  F/U Sleep evaluation   History of Present Illness:  Kelly Dickson is a 71 y.o. female was initially evaluated by me in follow-up of initiation of CPAP therapy 1 month ago.  At that time, she was noncompliant.  She presents for reevaluation.   Kelly Dickson is followed by Dr. [name] in the advanced heart failure clinic.  Kelly Dickson has a history of paroxysmal  atrial fibrillation, hypertension, moderate mitral regurgitation, and chronic systolic heart failure.  In 2016.  Ejection fraction was 25-30%.  Cardiac catheterization August 2016 showed mild nonobstructive CAD.  On medical therapy.  Her echo Doppler study and from her 2017 showed improvement of LV function with an EF of 45-50%, with severe left atrial enlargement, mild aortic stenosis, mild mitral regurgitation.  Referred for a diagnostic sleep study, which was interpreted by me on 05/06/2016.  She had markedly reduced sleep efficiency at 30.2%.  She had mild obstructive sleep apnea overall with an HI of 11.9 per hour; however, there was absence of from sleep in the severity of sleep apnea was felt most likely to be underestimated) sleep had occurred.  There was mild oxygen desaturation to a nadir of 88%.  Sleep architecture with absence of slow-wave in rem sleep.  There was loud snoring.  Acutely with her cardiovascular comorbidities, she was referred for CPAP titration trial.  This was done in August 2017 and was felt that her optimal CPAP pressure was 10 cm water pressure.  Her sleep efficiency was improved but still reduced at 58.8%.  She sagittally was initiated on CPAP therapy and her DME company.  His advance home care.  In the office today.  I obtained 3 download from 07/16/2016 through 10/13/2016; from 08/15/2016 3-D 05/13/2016; and over the last  month from November 13.  10/13/2016.  On all download.  She is meeting compliance with reference to usage stays ranging from 83-88%.  However, she is not compliant with reference to usage stays greater than 4 hours at only 43%.  She was averaging rest under 4 hours of sleep per night with CPAP therapy.  She states she goes to bed at 11:30 and wakes up at 8:30.  She has had some stress issues with her sister dying from cancer which has affected her sleep ability and may contribute to why she  has not been sleeping all night.    When I last saw her, she was noncompliant with reference to her CPAP use.  I reviewed her sleep study, although she was found to have only mild sleep apnea overall, I was suspicious that she may potentially have more significant sleep apnea which was undetected due to her reduced sleep efficiency, as well as absence of grams sleep on her sleep study.  A long discussion with her concerning eating medical compliance with reference to Medicare definitions.  Over the past month, she states that she continues to have difficulty sleeping, long duration since she is caring for her sister with metastatic cancer.  Oftentimes she does not go to bed until after 1 AM.  Although she oftentimes wakes up between 7 and 8 AM, at times when she gets up to go to the bathroom.  She putting back her CPAP mask in place.  A new download was obtained from 10/27/2016  through 11/25/2016.  She is meeting Medicare compliance standards with reference to usage stays with usage at 90%.  However, she is still failing in compliance with reference to percent of days greater than 4 hours use and only had 40%.  She is averaging 3 hours 14 minutes per night.  With CPAP therapy, however, her AHI is excellent at 0.3.  Leak, which is not consistent.  A new Epworth Sleepiness Scale score was calculated today and this endorsed at 6 are you against excessive daytime sleepiness.  Past Medical History:  Diagnosis Date  . A-fib  ([place])   . CHF (congestive heart failure) ([place])   . Essential hypertension   . HLD (hyperlipidemia)   . MR (mitral regurgitation)   . Panic attack   . Pulmonary hypertension   . TR (tricuspid regurgitation)     Past Surgical History:  Procedure Laterality Date  . CARDIOVERSION N/A 12/25/2015   Procedure: CARDIOVERSION;  Surgeon: Jolaine Artist, MD;  Location: [facility] ENDOSCOPY;  Service: Cardiovascular;  Laterality: N/A;  . HERNIA REPAIR    . REPLACEMENT TOTAL KNEE    . TONSILLECTOMY      Current Medications: Outpatient Medications Prior to Visit  Medication Sig Dispense Refill  . acetaminophen (TYLENOL) 650 MG CR tablet Take 1,300 mg by mouth every 8 (eight) hours as needed for pain.    [name] amiodarone (PACERONE) 200 MG tablet TAKE 1/2 TABLET BY MOUTH DAILY 15 tablet 3  . Bioflavonoid Products (VITAMIN C/BIOFLAVONOIDS) 1000-25 MG TABS Take 1 tablet by mouth daily. Reported on 12/24/2015    . Calcium Carbonate-Vitamin D (CALCIUM-VITAMIN D) 500-200 MG-UNIT tablet Take 1 tablet by mouth daily.    . Coenzyme Q10 (COQ10) 100 MG CAPS Take 100 mg by mouth daily.     . DULoxetine (CYMBALTA) 30 MG capsule Take 30 mg by mouth daily.    [name] ELIQUIS 5 MG TABS tablet TAKE 1 TABLET BY MOUTH 2 TIMES A DAY 60 tablet 3  . fluticasone (FLONASE) 50 MCG/ACT nasal spray Place 1 spray into both nostrils 2 (two) times daily as needed for allergies or rhinitis. 16 g 0  . furosemide (LASIX) 40 MG tablet TAKE 1 TABLET BY MOUTH EVERY DAY 30 tablet 3  . lactobacillus acidophilus & bulgar (LACTINEX) chewable tablet Chew 3 tablets by mouth at bedtime.     [name] LECITHIN PO Take 2 tablets by mouth 2 (two) times daily.     [name] loratadine (CLARITIN) 10 MG tablet Take 10 mg by mouth daily as needed for allergies.    . Multiple Vitamins-Minerals (MULTIVITAMIN WITH MINERALS) tablet Take 1 tablet by mouth daily.    . Omega-3 Fatty Acids (FISH OIL) 1000 MG CAPS Take 1,000 mg by mouth daily.     . Polyvinyl Alcohol-Povidone (MURINE  TEARS FOR DRY EYES) 5-6 MG/ML SOLN Apply 1-2 drops to eye as needed (for dry eyes and allergies).    . Red Yeast Rice 600 MG CAPS Take 600 mg by mouth daily.     . sacubitril-valsartan (ENTRESTO) 97-103 MG Take 1 tablet by mouth 2 (two) times daily. 60 tablet 6  . spironolactone (ALDACTONE) 25 MG tablet TAKE 1 TABLET BY MOUTH EVERY DAY AT NOON 30 tablet 3  . vitamin E 400 UNIT capsule Take 400 Units by mouth daily.      No facility-administered medications prior to visit.      Allergies:   Patient has no known allergies.   Social History   Social History  . Marital  status: Divorced    Spouse name: N/A  . Number of children: N/A  . Years of education: N/A   Social History Main Topics  . Smoking status: Never Smoker  . Smokeless tobacco: Not on file  . Alcohol use No  . Drug use: No  . Sexual activity: Not on file   Other Topics Concern  . Not on file   Social History Narrative  . No narrative on file   She is divorced and has 4 children.  Unfortunately, her son died with heart failure.  Family History:  The patient's family history includes Breast cancer in her mother; Hypertension in her sister.  A sister is dying from cancer.  ROS General: Negative; No fevers, chills, or night sweats;  HEENT: Negative; No changes in vision or hearing, sinus congestion, difficulty swallowing Pulmonary: Negative; No cough, wheezing, shortness of breath, hemoptysis Cardiovascular: see history of present illness GI: Negative; No nausea, vomiting, diarrhea, or abdominal pain GU: Negative; No dysuria, hematuria, or difficulty voiding Musculoskeletal: Negative; no myalgias, joint pain, or weakness Hematologic/Oncology: Negative; no easy bruising, bleeding Endocrine: Negative; no heat/cold intolerance; no diabetes Neuro: Negative; no changes in balance, headaches Skin: Negative; No rashes or skin lesions Psychiatric: Negative; No behavioral problems, depression Sleep : positive for  obstructive sleep apnea.  History of snoring, hypersomnolence, bruxism, restless legs, hypnogognic hallucinations, no cataplexy Other comprehensive 14 point system review is negative.   PHYSICAL EXAM:   VS:  BP (!) 162/72   Pulse (!) 50   Ht _0  (1.6 m)   Wt 209 lb (94.8 kg)   BMI 37.02 kg/m     Repeat blood pressure by me was 135/70.  Wt Readings from Last 3 Encounters:  11/26/16 209 lb (94.8 kg)  10/14/16 213 lb (96.6 kg)  06/11/16 213 lb 6.4 oz (96.8 kg)    General: Alert, oriented, no distress.  Skin: normal turgor, no rashes, warm and dry HEENT: Normocephalic, atraumatic. Pupils equal round and reactive to light; sclera anicteric; extraocular muscles intact; Fundi without hemorrhages or exudates.  Disc flat. Nose without nasal septal hypertrophy Mouth/Parynx benign; Mallinpatti scale 3 Neck: No JVD, no carotid bruits; normal carotid upstroke Lungs: clear to ausculatation and percussion; no wheezing or rales Chest wall: without tenderness to palpitation Heart: PMI not displaced, RRR, s1 s2 normal, 1/6 systolic murmur, no diastolic murmur, no rubs, gallops, thrills, or heaves Abdomen: soft, nontender; no hepatosplenomehaly, BS+; abdominal aorta nontender and not dilated by palpation. Back: no CVA tenderness Pulses 2+ Musculoskeletal: full range of motion, normal strength, no joint deformities Extremities: no clubbing cyanosis or edema, Homan's sign negative  Neurologic: grossly nonfocal; Cranial nerves grossly wnl Psychologic: Normal mood and affect   Studies/Labs Reviewed:   ECG not done today.  Previous ECG was reviewed: (independently read by me): Sinus bradycardia 56 bpm.  Left bundle branch block with repolarization changes.  Recent Labs: BMP Latest Ref Rng & Units 07/03/2016 04/17/2016 03/20/2016  Glucose 65 - 99 mg/dL 110(H) 105(H) 114(H)  BUN 8 - 27 mg/dL 16 19 23(H)  Creatinine 0.57 - 1.00 mg/dL 0.67 0.75 0.87  BUN/Creat Ratio 12 - 28 24 - -  Sodium 134 -  144 mmol/L 144 140 143  Potassium 3.5 - 5.2 mmol/L 4.3 4.4 4.0  Chloride 96 - 106 mmol/L 101 103 106  CO2 18 - 29 mmol/L _1 Calcium 8.7 - 10.3 mg/dL 9.5 9.4 9.3     Hepatic Function Latest Ref Rng & Units 07/03/2016  04/17/2016 01/28/2016  Total Protein 6.0 - 8.5 g/dL 7.0 6.7 7.2  Albumin 3.5 - 4.8 g/dL 4.3 3.4(L) 3.8  AST 0 - 40 IU/L _0 ALT 0 - 32 IU/L 10 13(L) 27  Alk Phosphatase 39 - 117 IU/L 92 79 77  Total Bilirubin 0.0 - 1.2 mg/dL 0.3 0.6 0.7  Bilirubin, Direct 0.1 - 0.5 mg/dL - 0.1 -    CBC Latest Ref Rng & Units 02/20/2016 11/28/2015 11/27/2015  WBC 4.0 - 10.5 K/uL 6.4 6.9 5.7  Hemoglobin 12.0 - 15.0 g/dL 12.0 11.6(L) 10.3(L)  Hematocrit 36.0 - 46.0 % 36.2 36.6 31.7(L)  Platelets 150 - 400 K/uL 214 195 144(L)   Lab Results  Component Value Date   MCV 92.6 02/20/2016   MCV 98.1 11/28/2015   MCV 98.1 11/27/2015   Lab Results  Component Value Date   TSH 3.100 07/03/2016   No results found for: HGBA1C   BNP    Component Value Date/Time   BNP 854.0 (H) 11/25/2015 2234    ProBNP No results found for: PROBNP   Lipid Panel  No results found for: CHOL, TRIG, HDL, CHOLHDL, VLDL, LDLCALC, LDLDIRECT   RADIOLOGY: No results found.   Additional studies/ records that were reviewed today include:  I reviewed the patient's advanced heart failure notes, diagnostic polysomnogram, CPAP titration trial, and 3 downloads since initiating CPAP therapy.    ASSESSMENT:    No diagnosis found.   PLAN:  Kelly. Denora Dickson is a very pleasant 71 year old female who has a history of nonischemic cardiomyopathy with previous LV dysfunction at 25%.  With medical therapy consisting of intestinal, spironolactone, diuretic therapy, and amiodarone, she is maintaining sinus rhythm and her LV function has improved.  She has a history of PAF and has been on anticoagulation. When I last saw her, I had a long discussion with both she and her daughter concerning her sleep studies,  which confirmed the diagnosis of obstructive sleep apnea.  Although she was only found to have mild sleep apnea overall, I suspect she may potentially have more significant sleep apnea which was undetected due to her reduced sleep efficiency and absence of REM sleep.  Her most recent download was reviewed in detail.  She is meeting Medicare compliance standards with usage stays.  However, she states that by taking care of her sister and not getting to bed until after 1 AM, she has had multiple reasons why she is unable to sleep for long duration which is contributing to her inadequate sleep duration with CPAP therapy.  I again stressed with her the importance of meeting compliance standards.  She clearly is benefiting from CPAP therapy.  Her AHI is excellent.  Treated sleep apnea.  I will reduce her risk for recurrent atrial fibrillation.  She will continue to try to improve.  I have recommended that a new download be obtained in 30 days, which hopefully will document objective compliance.  I'll be available as needed but otherwise will see her in one-year for reevaluation.  Medication Adjustments/Labs and Tests Ordered: Current medicines are reviewed at length with the patient today.  Concerns regarding medicines are outlined above.  Medication changes, Labs and Tests ordered today are listed in the Patient Instructions below. Patient Instructions  Your physician recommends that you schedule a follow-up appointment in 1 year. With Dr Claiborne Billings for sleep.    Time spent: 20 minutes Signed, [name], MD  11/27/2016 2:41 PM    Kelly Dickson  [address], [place], [place], [place]  [address] Phone: [phone]

## 2016-12-16 ENCOUNTER — Other Ambulatory Visit (HOSPITAL_COMMUNITY): Payer: Self-pay | Admitting: Student

## 2016-12-16 DIAGNOSIS — I5022 Chronic systolic (congestive) heart failure: Secondary | ICD-10-CM

## 2016-12-21 ENCOUNTER — Encounter: Payer: Self-pay | Admitting: Cardiovascular Disease

## 2016-12-25 ENCOUNTER — Telehealth: Payer: Self-pay | Admitting: *Deleted

## 2016-12-25 NOTE — Telephone Encounter (Signed)
Called and notified patient per Dr Tresa Endo that when she is using her CPAP machine it is working to control her symptoms, however she needs to increase the amount of time per night she is using it. Patient states that she is going to do better. Right now she is taking care of 2 of her ill sisters, one who is dying from metastatic brain CA. She does admit to feeling better when she uses it. I told her that I will get another download in 1 month to see where she is with her therapy. Patient agrees with plan and promised that she will do better.

## 2017-02-23 ENCOUNTER — Other Ambulatory Visit (HOSPITAL_COMMUNITY): Payer: Self-pay | Admitting: Internal Medicine

## 2017-02-24 ENCOUNTER — Other Ambulatory Visit (HOSPITAL_COMMUNITY): Payer: Self-pay | Admitting: Internal Medicine

## 2017-03-05 ENCOUNTER — Other Ambulatory Visit (HOSPITAL_COMMUNITY): Payer: Self-pay | Admitting: Student

## 2017-03-17 ENCOUNTER — Other Ambulatory Visit (HOSPITAL_COMMUNITY): Payer: Self-pay | Admitting: Internal Medicine

## 2017-04-27 ENCOUNTER — Ambulatory Visit (HOSPITAL_COMMUNITY)
Admission: RE | Admit: 2017-04-27 | Discharge: 2017-04-27 | Disposition: A | Discharge status: Home / Self Care | Payer: Medicare Other | Source: Ambulatory Visit | Attending: Internal Medicine | Admitting: Internal Medicine

## 2017-04-27 VITALS — BP 170/78 | HR 68 | Wt 219.0 lb

## 2017-04-27 DIAGNOSIS — R001 Bradycardia, unspecified: Secondary | ICD-10-CM

## 2017-04-27 DIAGNOSIS — I480 Paroxysmal atrial fibrillation: Secondary | ICD-10-CM | POA: Insufficient documentation

## 2017-04-27 DIAGNOSIS — I071 Rheumatic tricuspid insufficiency: Secondary | ICD-10-CM | POA: Diagnosis not present

## 2017-04-27 DIAGNOSIS — I10 Essential (primary) hypertension: Secondary | ICD-10-CM

## 2017-04-27 DIAGNOSIS — G4733 Obstructive sleep apnea (adult) (pediatric): Secondary | ICD-10-CM | POA: Insufficient documentation

## 2017-04-27 DIAGNOSIS — I340 Nonrheumatic mitral (valve) insufficiency: Secondary | ICD-10-CM

## 2017-04-27 DIAGNOSIS — G459 Transient cerebral ischemic attack, unspecified: Secondary | ICD-10-CM | POA: Diagnosis not present

## 2017-04-27 DIAGNOSIS — I110 Hypertensive heart disease with heart failure: Secondary | ICD-10-CM | POA: Insufficient documentation

## 2017-04-27 DIAGNOSIS — E785 Hyperlipidemia, unspecified: Secondary | ICD-10-CM | POA: Insufficient documentation

## 2017-04-27 DIAGNOSIS — Z8673 Personal history of transient ischemic attack (TIA), and cerebral infarction without residual deficits: Secondary | ICD-10-CM | POA: Insufficient documentation

## 2017-04-27 DIAGNOSIS — I447 Left bundle-branch block, unspecified: Secondary | ICD-10-CM | POA: Insufficient documentation

## 2017-04-27 DIAGNOSIS — Z7901 Long term (current) use of anticoagulants: Secondary | ICD-10-CM | POA: Diagnosis not present

## 2017-04-27 DIAGNOSIS — I272 Pulmonary hypertension, unspecified: Secondary | ICD-10-CM | POA: Insufficient documentation

## 2017-04-27 DIAGNOSIS — F410 Panic disorder [episodic paroxysmal anxiety] without agoraphobia: Secondary | ICD-10-CM | POA: Insufficient documentation

## 2017-04-27 DIAGNOSIS — I495 Sick sinus syndrome: Secondary | ICD-10-CM | POA: Diagnosis not present

## 2017-04-27 DIAGNOSIS — I5022 Chronic systolic (congestive) heart failure: Secondary | ICD-10-CM | POA: Insufficient documentation

## 2017-04-27 DIAGNOSIS — Z7982 Long term (current) use of aspirin: Secondary | ICD-10-CM | POA: Diagnosis not present

## 2017-04-27 LAB — COMPREHENSIVE METABOLIC PANEL
ALBUMIN: 4 g/dL (ref 3.5–5.0)
ALK PHOS: 88 U/L (ref 38–126)
ALT: 17 U/L (ref 14–54)
ANION GAP: 6 (ref 5–15)
AST: 19 U/L (ref 15–41)
BUN: 32 mg/dL — ABNORMAL HIGH (ref 6–20)
CO2: 27 mmol/L (ref 22–32)
Calcium: 9.2 mg/dL (ref 8.9–10.3)
Chloride: 107 mmol/L (ref 101–111)
Creatinine, Ser: 1.27 mg/dL — ABNORMAL HIGH (ref 0.44–1.00)
GFR calc Af Amer: 48 mL/min — ABNORMAL LOW (ref >60)
GFR calc non Af Amer: 41 mL/min — ABNORMAL LOW (ref >60)
GLUCOSE: 113 mg/dL — AB (ref 65–99)
POTASSIUM: 3.8 mmol/L (ref 3.5–5.1)
Sodium: 140 mmol/L (ref 135–145)
Total Bilirubin: 0.5 mg/dL (ref 0.3–1.2)
Total Protein: 6.8 g/dL (ref 6.5–8.1)

## 2017-04-27 LAB — CBC
HEMATOCRIT: 37.5 % (ref 36.0–46.0)
HEMOGLOBIN: 11.9 g/dL — AB (ref 12.0–15.0)
MCH: 29.4 pg (ref 26.0–34.0)
MCHC: 31.7 g/dL (ref 30.0–36.0)
MCV: 92.6 fL (ref 78.0–100.0)
Platelets: 245 10*3/uL (ref 150–400)
RBC: 4.05 MIL/uL (ref 3.87–5.11)
RDW: 14.5 % (ref 11.5–15.5)
WBC: 8.3 10*3/uL (ref 4.0–10.5)

## 2017-04-27 LAB — TSH: TSH: 3.473 u[IU]/mL (ref 0.350–4.500)

## 2017-04-27 LAB — BRAIN NATRIURETIC PEPTIDE: B Natriuretic Peptide: 94.4 pg/mL (ref 0.0–100.0)

## 2017-04-27 MED ORDER — AMLODIPINE BESYLATE 5 MG PO TABS: 5.0000 mg | ORAL_TABLET | Freq: Every day | ORAL | 6 refills | Status: DC

## 2017-04-27 NOTE — Progress Notes (Signed)
Advanced Heart Failure Medication Review by a Pharmacist  Does the patient  feel that his/her medications are working for him/her?  yes  Has the patient been experiencing any side effects to the medications prescribed?  no  Does the patient measure his/her own blood pressure or blood glucose at home? yes   Does the patient have any problems obtaining medications due to transportation or finances?   no  Understanding of regimen: good Understanding of indications: good Potential of compliance: good Patient understands to avoid NSAIDs. Patient understands to avoid decongestants.  Issues to address at subsequent visits: none.   Pharmacist comments: Mrs. Burda is a pleasant 71 y/o female who presents with daughter and medication list. She endorses good adherence to her medication regimen. Patient had no medication-related questions or concerns for me at this time.   Marlinda Mike PharmD Candidate  Time with patient: 10 minutes Preparation and documentation time: 10 minutes Total time: 20 minutes

## 2017-04-27 NOTE — Progress Notes (Signed)
Patient ID: Kelly Dickson, female   DOB: 09-May-1946, 71 y.o.   MRN: [id number]    Advanced Heart Failure Clinic Note   Primary Care: Dr Luiz Iron Primary Cardiologist: Dr Willeen Cass Primary HF: Dr. Gala Romney   HPI:  Kelly Dickson is a 71 y.o. female with Mod MR, Afib, HTN, and chronic systolic HF who presents to establish in HF clinic.   Problems started last year while taking care of her chronically ill son, who passed away this November 28, 2022. Has been unable to walk long distances for awhile. Summer of 2016 was having episodes of SOB, weakness, and diaphoresis that led to full cardiac work up as below.    Echo 12/31/15 EF 45-50%, Severe LAE, Mild AS, Mild MR  At last visit had wound on LLE that was debrided in clinic and started on doxycycline. She stated that the injury was from her previous hospitalization in 2015/11/29.  It continued to worsen and she was given a referral to Wound Center but never heard back.  Her daughter self arranged wound care follow up in [facility] and they were seen the week 02/26/16.  Admitted to [facility] in [place], [place] earlier this month with symptoms concerning for TIA/CVA with RUE weakness that quickly resolved. Complete work up as below with no clear acute cause. Has follow up with Neurology next week.   She presents today for post hospital follow. Breathing has been great. Gained some weight while she was taking care of her sister. Trying to exercise. Goes to [employer] once a week and walking in the house. Denies SOB or orthopnea.  Occasionally getting lightheaded with rapid standing. Denies peripheral edema. Has been released from Wound care, doing well per patient.  Taking all medications as directed.  Wearing CPAP every night.  Denies any bleeding on Eliquis.   Other studies reviewed at todays visit.   CT 04/18/17 @ [facility] in [place], [place] 1. No acute abnormality 2. Intracranial atherosclerosis 3. Asymmetric volume loss  and mild chronic small vessel ischemic changes in the left greater than right cerebral hemisphere  US carotid 04/19/17 @ [facility] in [place], [place] - No hemodynamically significant stenosis.   MRI Brain, without contrast 04/19/17 @ [facility] in [place], [place] 1. Asymmetrically diminished caliber of the L ICA. Poor visual of left MCA 2. Assymmetric volume loss in the left cerebral hemisphere 3. 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 Mild chronic small vessel ischemic changes in the left greater than right cerebral hemisphere  Echo 04/19/17 @ [facility] in [place], [place] LVEF 55-60%, Severe LAE, Mild/mod MR, Mild TR,   LHC 06/27/15 - Dr Rolly Salter Willeen Cass - Right dominant anatomy - Left main - medium size and normal - LAD - medium caliber, 20% lesion in proximal portion - LCx - medium caliber, with few obtuse marginal branches but normal - RCA - large and dominant artery, without signifcant disease.   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 EF ~ 36%.  Review of systems complete and found to be negative unless listed in HPI.    Past Medical History:  Diagnosis Date  . A-fib ([facility])   . CHF (congestive heart failure) ([facility])   . Essential hypertension   . HLD (hyperlipidemia)   .  MR (mitral regurgitation)   . Panic attack   . Pulmonary hypertension   . TR (tricuspid regurgitation)     Current Outpatient Prescriptions  Medication Sig Dispense Refill  . acetaminophen (TYLENOL) 650 MG CR tablet Take 1,300 mg by mouth every 8 (eight) hours as needed for pain.    [name] amiodarone (PACERONE) 200 MG tablet TAKE 1/2 TABLET BY MOUTH DAILY 15 tablet 11  . aspirin 81 MG chewable tablet Chew 81 mg by mouth daily.    . Bioflavonoid Products (VITAMIN  C/BIOFLAVONOIDS) 1000-25 MG TABS Take 1 tablet by mouth daily. Reported on 12/24/2015    . Calcium Carbonate-Vitamin D (CALCIUM-VITAMIN D) 500-200 MG-UNIT tablet Take 1 tablet by mouth daily.    . Cinnamon 500 MG capsule Take 500 mg by mouth daily.    . Coenzyme Q10 (COQ10) 100 MG CAPS Take 100 mg by mouth daily.     . DULoxetine (CYMBALTA) 30 MG capsule Take 30 mg by mouth daily.    [name] ELIQUIS 5 MG TABS tablet TAKE 1 TABLET BY MOUTH 2 TIMES A DAY 60 tablet 6  . ENTRESTO 97-103 MG TAKE 1 TABLET BY MOUTH 2 TIMES A DAY 60 tablet 6  . furosemide (LASIX) 40 MG tablet TAKE 1 TABLET BY MOUTH EVERY DAY 30 tablet 6  . lactobacillus acidophilus & bulgar (LACTINEX) chewable tablet Chew 3 tablets by mouth at bedtime.     [name] LECITHIN PO Take 2 tablets by mouth daily.     [name] loratadine (CLARITIN) 10 MG tablet Take 10 mg by mouth daily as needed for allergies.    . Multiple Vitamins-Minerals (MULTIVITAMIN WITH MINERALS) tablet Take 1 tablet by mouth daily.    . Omega-3 Fatty Acids (FISH OIL) 1000 MG CAPS Take 1,000 mg by mouth daily.     . Red Yeast Rice 600 MG CAPS Take 600 mg by mouth daily.     [name] spironolactone (ALDACTONE) 25 MG tablet TAKE 1 TABLET BY MOUTH EVERY DAY AT NOON 90 tablet 3  . vitamin E 400 UNIT capsule Take 400 Units by mouth daily.     [name] amLODipine (NORVASC) 5 MG tablet Take 1 tablet (5 mg total) by mouth daily. 30 tablet 6   No current facility-administered medications for this encounter.     No Known Allergies    Social History   Social History  . Marital status: Divorced    Spouse name: N/A  . Number of children: N/A  . Years of education: N/A   Occupational History  . Not on file.   Social History Main Topics  . Smoking status: Never Smoker  . Smokeless tobacco: Not on file  . Alcohol use No  . Drug use: No  . Sexual activity: Not on file   Other Topics Concern  . Not on file   Social History Narrative  . No narrative on file     Family History  Problem Relation  Age of Onset  . Breast cancer Mother   . Hypertension Sister     Vitals:   04/27/17 1359  BP: (!) 170/78  Pulse: 68  SpO2: 100%  Weight: 219 lb (99.3 kg)   Recheck BP 164/82   Wt Readings from Last 3 Encounters:  04/27/17 219 lb (99.3 kg)  11/26/16 209 lb (94.8 kg)  10/14/16 213 lb (96.6 kg)    PHYSICAL EXAM: General: Elderly appearing. No resp difficulty. HEENT: Normal Neck: Supple. JVP 6-7 cm. Carotids 2+ bilat; no bruits. No thyromegaly or nodule  noted. Cor: PMI nondisplaced. Regular. Slightly brady. Split S2. No M/G/R noted Lungs: CTAB, normal effort. Abdomen: Soft, non-tender, non-distended, no HSM. No bruits or masses. +BS  Extremities: No cyanosis, clubbing, rash, R and LLE no edema.  Neuro: Alert & orientedx3, cranial nerves grossly intact. moves all 4 extremities w/o difficulty. Affect pleasant   ECG: Personally reviewed, Sinus bradycardia 56 bpm, LBBB 168 ms  ASSESSMENT & PLAN:  1. Chronic systolic HF - Echo 12/31/15 EF 11-91%, Severe LAE, Mild AS, Mild MR - NYHA II symptoms.  - Volume status stable on exam.  - Off Coreg with bradycardia - Continue Entresto 97/103 mg BID.   - Continue spironolactone 25 mg daily.  - Reinforced fluid restriction to < 2 L daily, sodium restriction to less than 2000 mg daily, and the importance of daily weights.   2. Paroxysmal Afib S/p DCCV 12/25/15 - EKG shows NSR/sinus brady. Continue Eliquis 5 mg BID. Denies bleeding.   - Continue amiodarone 100 mg daily.  LFTs and TSHs normal 07/2016. Repeat labs today.   3. Mod MR/Mod TR  - Both mild on recent Echo in [place].   4. 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 TR Max PG 55.5 mm Hg by previous echo  - Peak PA pressure 50-60 by Echo at [facility].   5. HLD - Per PCP.  6. OSA - Continue CPAP nightly.  7. LBBB, 170 ms by echo today. - Recent Echo with improved EF. If EF trends back down in the future, she may benefit from CRT-D given wide LBBB.  8. Tachy-brady - Remains brady.  Continue to follow for possible PPM.  9. Deconditioning - Continue to increase activity as able. Encouraged to go to [employer] more often.  10. LLE wound - Resolved.  11. HTN - Add amlodipine 5 mg daily with improved EF. She has tolerated well previously. (of note she states her BP is usually closer to 120-130 at home. It has been chronically elevated here in clinic) 12. TIA - With recent admission. Work up with no acute changes, but chronic changes effecting left MCA territory.  - Continue Eliquis.  - Follows with Neuro in July.  Doing well from HF standpoint. Recently set back with TIA. Following with neurology. RTC 2 months. PT to let us know if BP remains elevated at home despite amlodipine.   Kelly Freer, PA-C  04/27/2017 2:56 PM   Greater than 50% of the 25 minute visit was spent in counseling/coordination of care regarding disease state education, medication reconciliation, discussion of medication regimen with Pharm-D, and review of outside records.

## 2017-04-27 NOTE — Patient Instructions (Signed)
Routine lab work today. Will notify you of abnormal results, otherwise no news is good news!  START Amlodipine (Norvasc) 5 mg tablet once daily.  Follow up with Dr. Gala Romney in 2 months. Take all medication as prescribed the day of your appointment. Bring all medications with you to your appointment.  Do the following things EVERYDAY: 1) Weigh yourself in the morning before breakfast. Write it down and keep it in a log. 2) Take your medicines as prescribed 3) Eat low salt foods-Limit salt (sodium) to 2000 mg per day.  4) Stay as active as you can everyday 5) Limit all fluids for the day to less than 2 liters

## 2017-06-29 ENCOUNTER — Encounter (HOSPITAL_COMMUNITY): Payer: Medicare Other | Admitting: Internal Medicine

## 2017-07-28 ENCOUNTER — Encounter (HOSPITAL_COMMUNITY): Payer: Medicare Other | Admitting: Internal Medicine

## 2017-09-03 ENCOUNTER — Other Ambulatory Visit (HOSPITAL_COMMUNITY): Payer: Self-pay | Admitting: Internal Medicine

## 2017-09-09 ENCOUNTER — Encounter (HOSPITAL_COMMUNITY): Payer: Medicare Other | Admitting: Internal Medicine

## 2017-10-23 ENCOUNTER — Other Ambulatory Visit (HOSPITAL_COMMUNITY): Payer: Self-pay | Admitting: Student

## 2017-10-23 ENCOUNTER — Other Ambulatory Visit (HOSPITAL_COMMUNITY): Payer: Self-pay | Admitting: Internal Medicine

## 2017-11-10 ENCOUNTER — Encounter (HOSPITAL_COMMUNITY): Payer: Medicare Other | Admitting: Internal Medicine

## 2017-12-11 ENCOUNTER — Other Ambulatory Visit (HOSPITAL_COMMUNITY): Payer: Self-pay | Admitting: Cardiology

## 2017-12-11 DIAGNOSIS — I5022 Chronic systolic (congestive) heart failure: Secondary | ICD-10-CM

## 2017-12-22 ENCOUNTER — Other Ambulatory Visit: Payer: Self-pay

## 2017-12-22 ENCOUNTER — Other Ambulatory Visit (HOSPITAL_COMMUNITY): Payer: Self-pay | Admitting: *Deleted

## 2017-12-22 ENCOUNTER — Ambulatory Visit (HOSPITAL_COMMUNITY)
Admission: RE | Admit: 2017-12-22 | Discharge: 2017-12-22 | Disposition: A | Discharge status: Home / Self Care | Payer: Medicare Other | Source: Ambulatory Visit | Attending: Internal Medicine | Admitting: Internal Medicine

## 2017-12-22 ENCOUNTER — Encounter (HOSPITAL_COMMUNITY): Payer: Self-pay | Admitting: *Deleted

## 2017-12-22 VITALS — BP 125/64 | HR 91 | Wt 231.1 lb

## 2017-12-22 DIAGNOSIS — I5022 Chronic systolic (congestive) heart failure: Secondary | ICD-10-CM | POA: Diagnosis not present

## 2017-12-22 DIAGNOSIS — G4733 Obstructive sleep apnea (adult) (pediatric): Secondary | ICD-10-CM | POA: Diagnosis not present

## 2017-12-22 DIAGNOSIS — Z8673 Personal history of transient ischemic attack (TIA), and cerebral infarction without residual deficits: Secondary | ICD-10-CM | POA: Diagnosis not present

## 2017-12-22 DIAGNOSIS — I2510 Atherosclerotic heart disease of native coronary artery without angina pectoris: Secondary | ICD-10-CM | POA: Diagnosis not present

## 2017-12-22 DIAGNOSIS — I272 Pulmonary hypertension, unspecified: Secondary | ICD-10-CM | POA: Insufficient documentation

## 2017-12-22 DIAGNOSIS — Z79899 Other long term (current) drug therapy: Secondary | ICD-10-CM | POA: Insufficient documentation

## 2017-12-22 DIAGNOSIS — Z7982 Long term (current) use of aspirin: Secondary | ICD-10-CM | POA: Diagnosis not present

## 2017-12-22 DIAGNOSIS — I429 Cardiomyopathy, unspecified: Secondary | ICD-10-CM | POA: Insufficient documentation

## 2017-12-22 DIAGNOSIS — I495 Sick sinus syndrome: Secondary | ICD-10-CM | POA: Insufficient documentation

## 2017-12-22 DIAGNOSIS — Z9889 Other specified postprocedural states: Secondary | ICD-10-CM | POA: Insufficient documentation

## 2017-12-22 DIAGNOSIS — I447 Left bundle-branch block, unspecified: Secondary | ICD-10-CM | POA: Diagnosis not present

## 2017-12-22 DIAGNOSIS — E785 Hyperlipidemia, unspecified: Secondary | ICD-10-CM | POA: Insufficient documentation

## 2017-12-22 DIAGNOSIS — I480 Paroxysmal atrial fibrillation: Secondary | ICD-10-CM | POA: Insufficient documentation

## 2017-12-22 DIAGNOSIS — Z8249 Family history of ischemic heart disease and other diseases of the circulatory system: Secondary | ICD-10-CM | POA: Insufficient documentation

## 2017-12-22 DIAGNOSIS — F410 Panic disorder [episodic paroxysmal anxiety] without agoraphobia: Secondary | ICD-10-CM | POA: Diagnosis not present

## 2017-12-22 DIAGNOSIS — Z7902 Long term (current) use of antithrombotics/antiplatelets: Secondary | ICD-10-CM | POA: Diagnosis not present

## 2017-12-22 DIAGNOSIS — I110 Hypertensive heart disease with heart failure: Secondary | ICD-10-CM | POA: Insufficient documentation

## 2017-12-22 DIAGNOSIS — Z803 Family history of malignant neoplasm of breast: Secondary | ICD-10-CM | POA: Insufficient documentation

## 2017-12-22 DIAGNOSIS — Z6841 Body Mass Index (BMI) 40.0 and over, adult: Secondary | ICD-10-CM | POA: Insufficient documentation

## 2017-12-22 DIAGNOSIS — E669 Obesity, unspecified: Secondary | ICD-10-CM | POA: Insufficient documentation

## 2017-12-22 LAB — PROTIME-INR
INR: 1.18
PROTHROMBIN TIME: 14.9 s (ref 11.4–15.2)

## 2017-12-22 LAB — BASIC METABOLIC PANEL
Anion gap: 11 (ref 5–15)
BUN: 23 mg/dL — AB (ref 6–20)
CALCIUM: 9.2 mg/dL (ref 8.9–10.3)
CO2: 24 mmol/L (ref 22–32)
CREATININE: 0.8 mg/dL (ref 0.44–1.00)
Chloride: 106 mmol/L (ref 101–111)
GFR calc Af Amer: >60 mL/min (ref >60)
Glucose, Bld: 101 mg/dL — ABNORMAL HIGH (ref 65–99)
POTASSIUM: 4 mmol/L (ref 3.5–5.1)
SODIUM: 141 mmol/L (ref 135–145)

## 2017-12-22 LAB — CBC
HCT: 35.6 % — ABNORMAL LOW (ref 36.0–46.0)
Hemoglobin: 10.9 g/dL — ABNORMAL LOW (ref 12.0–15.0)
MCH: 26.8 pg (ref 26.0–34.0)
MCHC: 30.6 g/dL (ref 30.0–36.0)
MCV: 87.7 fL (ref 78.0–100.0)
PLATELETS: 218 10*3/uL (ref 150–400)
RBC: 4.06 MIL/uL (ref 3.87–5.11)
RDW: 16.3 % — AB (ref 11.5–15.5)
WBC: 7.1 10*3/uL (ref 4.0–10.5)

## 2017-12-22 MED ORDER — AMIODARONE HCL 200 MG PO TABS: 200.0000 mg | ORAL_TABLET | Freq: Every day | ORAL | 3 refills | Status: DC

## 2017-12-22 NOTE — Progress Notes (Signed)
Patient ID: Kelly Dickson, female   DOB: [birth year], 72 y.o.   MRN: [id number]    Advanced Heart Failure Clinic Note   Primary Care: Dr Luiz Iron Primary Cardiologist: Dr Willeen Cass Primary HF: Dr. Gala Romney   HPI: Kelly Dickson is a 72 y.o. female with obesity, PAF and systolic HF (with recovered ejection fraction)  Diagnosed with systolic HF in 2016 at [facility].   Echo 05/23/15 [facility] LVEF 25-30%, RV mild/mod reduced, Mode LAE, mild RAE, Mod MR, Mod TR, RVSP(TR) 70.5 mm Hg  Myoview 05/14/15 - [facility] Mild ischemia in the apical septal region. EF ~ 36%.  LHC 06/27/15 - Dr Rolly Salter Willeen Cass - Right dominant anatomy - Left main - medium size and normal - LAD - medium caliber, 20% lesion in proximal portion - LCx - medium caliber, with few obtuse marginal branches but normal - RCA - large and dominant artery, without signifcant disease.  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 She was not tolerating AF well. So underwent DC-CV on 12/25/15. Post DC-CV EF and symptoms improved. Echo 12/31/15 EF 45-50%, Severe LAE, Mild AS, Mild MR  Admitted to [facility] in [place], [place] 04/2017 with symptoms concerning for TIA/CVA with RUE weakness that quickly resolved. Complete work up as below with no clear acute cause. Echo 55-60% with severe LAE and mild/mod MR.  She presents today for HF follow up. Her daughter is present with her. She had palpitations with associated SOB, shakiness, and diaphoresis 2 weeks ago. By ECG she is back in AF today. She thinks it was related to taking morning meds late. She is now on a schedule. No CP. SOB with minimal ambulating, like walking around store. No dizziness, but stands up slowly because of knees. Now taking hemp oil for join pain. No bleeding. Riding stationary bike 2 times per week, 30 minutes at a time. Doing aerobics at home. No orthopnea, PND. Wearing CPAP 5x/week.  Taking meds as prescribed. Weights at home: slowly increasing since December. Last week or two has gone down some. She does not want to give me numbers. She is drinking more fluid than she's supposed to. Cooks and does good without adding salt. Still urinating a lot with 40 mg lasix daily. Per daughter, pt has been having problems since December with palpitations, SOB, and shakiness. Pt has been attributing this to anxiety. Has gained 20 pounds over past year.    CT 04/18/17 @ [facility] in [place], [place] 1. No acute abnormality 2. Intracranial atherosclerosis 3. Asymmetric volume loss and mild chronic small vessel ischemic changes in the left greater than right cerebral hemisphere  US carotid 04/19/17 @ [facility] in [place], [place] - No hemodynamically significant stenosis.   MRI Brain, without contrast 04/19/17 @ [facility] in [place], [place] 1. Asymmetrically diminished caliber of the L ICA. Poor visual of left MCA 2. Assymmetric volume loss in the left cerebral hemisphere 3. 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 Mild chronic small vessel ischemic changes in the left greater than right cerebral hemisphere  Echo 04/19/17 @ [facility] in [place], [place] LVEF 55-60%, Severe LAE, Mild/mod MR, Mild TR,     Review of systems complete and found to be negative unless listed in HPI.    Past Medical History:  Diagnosis Date  . A-fib (HCC)   .  CHF (congestive heart failure) (HCC)   . Essential hypertension   . HLD (hyperlipidemia)   . MR (mitral regurgitation)   . Panic attack   . Pulmonary hypertension   . TR (tricuspid regurgitation)     Current Outpatient Medications  Medication Sig Dispense Refill  . acetaminophen (TYLENOL) 650 MG CR tablet Take 1,300 mg by mouth every 8 (eight) hours as needed for pain.    [name] amiodarone (PACERONE) 200 MG tablet TAKE 1/2 TABLET BY MOUTH DAILY 15 tablet 1  . amLODipine (NORVASC)  5 MG tablet TAKE 1 TABLET BY MOUTH EVERY DAY 30 tablet 6  . aspirin 81 MG chewable tablet Chew 81 mg by mouth daily.    . Bioflavonoid Products (VITAMIN C/BIOFLAVONOIDS) 1000-25 MG TABS Take 1 tablet by mouth daily. Reported on 12/24/2015    . Calcium Carbonate-Vitamin D (CALCIUM-VITAMIN D) 500-200 MG-UNIT tablet Take 1 tablet by mouth daily.    . Cinnamon 500 MG capsule Take 500 mg by mouth daily.    . Coenzyme Q10 (COQ10) 100 MG CAPS Take 100 mg by mouth daily.     . DULoxetine (CYMBALTA) 30 MG capsule Take 30 mg by mouth daily.    [name] ELIQUIS 5 MG TABS tablet TAKE 1 TABLET BY MOUTH 2 TIMES A DAY 60 tablet 6  . ENTRESTO 97-103 MG TAKE 1 TABLET BY MOUTH 2 TIMES A DAY 60 tablet 3  . furosemide (LASIX) 40 MG tablet TAKE 1 TABLET BY MOUTH EVERY DAY 30 tablet 3  . gabapentin (NEURONTIN) 300 MG capsule Take 300 mg by mouth 2 (two) times daily.    [name] lactobacillus acidophilus & bulgar (LACTINEX) chewable tablet Chew 3 tablets by mouth at bedtime.     [name] LECITHIN PO Take 2 tablets by mouth daily.     [name] loratadine (CLARITIN) 10 MG tablet Take 10 mg by mouth daily as needed for allergies.    . Multiple Vitamins-Minerals (MULTIVITAMIN WITH MINERALS) tablet Take 1 tablet by mouth daily.    . Omega-3 Fatty Acids (FISH OIL) 1000 MG CAPS Take 1,000 mg by mouth daily.     . Red Yeast Rice 600 MG CAPS Take 600 mg by mouth daily.     [name] spironolactone (ALDACTONE) 25 MG tablet TAKE 1 TABLET BY MOUTH EVERY DAY AT NOON 90 tablet 3  . vitamin E 400 UNIT capsule Take 400 Units by mouth daily.      No current facility-administered medications for this encounter.     No Known Allergies    Social History   Socioeconomic History  . Marital status: Divorced    Spouse name: Not on file  . Number of children: Not on file  . Years of education: Not on file  . Highest education level: Not on file  Social Needs  . Financial resource strain: Not on file  . Food insecurity - worry: Not on file  . Food insecurity -  inability: Not on file  . Transportation needs - medical: Not on file  . Transportation needs - non-medical: Not on file  Occupational History  . Not on file  Tobacco Use  . Smoking status: Never Smoker  Substance and Sexual Activity  . Alcohol use: No  . Drug use: No  . Sexual activity: Not on file  Other Topics Concern  . Not on file  Social History Narrative  . Not on file     Family History  Problem Relation Age of Onset  . Breast cancer Mother   .  Hypertension Sister     Vitals:   12/22/17 0943  BP: 125/64  Pulse: 91  SpO2: 97%  Weight: 231 lb 1.9 oz (104.8 kg)    Wt Readings from Last 3 Encounters:  12/22/17 231 lb 1.9 oz (104.8 kg)  04/27/17 219 lb (99.3 kg)  11/26/16 209 lb (94.8 kg)    PHYSICAL EXAM: General: Elderly. Walks with cane. No resp difficulty. HEENT: Normal Neck: Supple. JVP 7. Carotids 2+ bilat; no bruits. No thyromegaly or nodule noted. Cor: PMI nondisplaced. Irregular rhythm, No M/G/R noted Split S2 Lungs: CTAB, normal effort. Abdomen: Marked centraSoft, non-tender, non-distended, no HSM. No bruits or masses. +BS  Extremities: No cyanosis, clubbing, or rash. R and LLE trace edema Neuro: Alert & orientedx3, cranial nerves grossly intact. moves all 4 extremities w/o difficulty. Affect pleasant  ECG: A fib, rate 93. Personally reviewed.  ASSESSMENT & PLAN:  1. Chronic systolic HF - Echo 12/31/15 EF 16-10%, Severe LAE, Mild AS, Mild MR; Echo 04/2017 LVEF 55-60%, Severe LAE, Mild/mod MR, Mild TR,  - NYHA III symptoms.  - Volume status stable. Weight is up 12 lbs, but does not appear to be fluid. - Taking 40 mg lasix daily. - Off Coreg with bradycardia. HR 90's today. - Continue Entresto 97/103 mg BID.   - Continue spironolactone 25 mg daily.  - Repeat echo next week - Reinforced fluid restriction to < 2 L daily, sodium restriction to less than 2000 mg daily, and the importance of daily weights.   2. Paroxysmal Afib S/p DCCV 12/25/15 -  Continue Eliquis 5 mg BID. Denies bleeding.   - Increase amiodarone to 200 mg BID. LFTs and TSHs 04/2017.  - Sounds like she may have converted to a fib in November/December. HR is irregular. EKG shows a fib 90's today. - A fib may be contributing to her symptoms. Plan for DC-CV in 2 weeks if echo looks okay. - Refer to a fib clinic 3. Mod MR/Mod TR  - Both mild on recent Echo in [place].   4. 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 TR Max PG 55.5 mm Hg by previous echo  - Peak PA pressure 50-60 by Echo at [facility].   5. HLD - Per PCP.  6. OSA - Continue CPAP nightly.  7. LBBB, 170 ms by echo today. - Recent Echo with improved EF. If EF trends back down in the future, she may benefit from CRT-D given wide LBBB.  8. Tachy-brady - Remains brady. Continue to follow for possible PPM.  9. Deconditioning - Continue to increase activity as able.  10. LLE wound - Resolved.  11. HTN - Continue amlodipine 5 mg daily with improved EF. She has tolerated well previously.  12. TIA - With admission in 2018. Work up with no acute changes, but chronic changes effecting left MCA territory.  - Continue Eliquis.  - Follows with Neuro   Increase amio to 200 mg BID Refer to a fib clinic Echo in 1 week, DC-CV in 2 weeks if echo looks good PT eval  Alford Highland, NP  12/22/2017 10:09 AM   Patient seen and examined with the above-signed Advanced Practice Provider and/or Housestaff. I personally reviewed laboratory data, imaging studies and relevant notes. I independently examined the patient and formulated the important aspects of the plan. I have edited the note to reflect any of my changes or salient points. I have personally discussed the plan with the patient and/or family.  She has progressive NYHA III-IIIB  symptoms despite normalization of her EF. Suspect this is related to recurrent AF as well as her weight gain. Will increase amiodarone to 200 bid and plan DC-CV. Strongly reinforced need for  diet and weight loss.   Total time spent 35 minutes. Over half that time spent discussing above.   Arvilla Meres, MD  3:44 PM

## 2017-12-22 NOTE — H&P (View-Only) (Signed)
Patient ID: Kelly Dickson, female   DOB: [birth year], 72 y.o.   MRN: [id number]    Advanced Heart Failure Clinic Note   Primary Care: Dr Luiz Iron Primary Cardiologist: Dr Willeen Cass Primary HF: Dr. Gala Romney   HPI: Kelly Dickson is a 72 y.o. female with obesity, PAF and systolic HF (with recovered ejection fraction)  Diagnosed with systolic HF in 2016 at [facility].   Echo 05/23/15 [facility] LVEF 25-30%, RV mild/mod reduced, Mode LAE, mild RAE, Mod MR, Mod TR, RVSP(TR) 70.5 mm Hg  Myoview 05/14/15 - [facility] Mild ischemia in the apical septal region. EF ~ 36%.  LHC 06/27/15 - Dr Rolly Salter Willeen Cass - Right dominant anatomy - Left main - medium size and normal - LAD - medium caliber, 20% lesion in proximal portion - LCx - medium caliber, with few obtuse marginal branches but normal - RCA - large and dominant artery, without signifcant disease.  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 She was not tolerating AF well. So underwent DC-CV on 12/25/15. Post DC-CV EF and symptoms improved. Echo 12/31/15 EF 45-50%, Severe LAE, Mild AS, Mild MR  Admitted to [facility] in [place], [place] 04/2017 with symptoms concerning for TIA/CVA with RUE weakness that quickly resolved. Complete work up as below with no clear acute cause. Echo 55-60% with severe LAE and mild/mod MR.  She presents today for HF follow up. Her daughter is present with her. She had palpitations with associated SOB, shakiness, and diaphoresis 2 weeks ago. By ECG she is back in AF today. She thinks it was related to taking morning meds late. She is now on a schedule. No CP. SOB with minimal ambulating, like walking around store. No dizziness, but stands up slowly because of knees. Now taking hemp oil for join pain. No bleeding. Riding stationary bike 2 times per week, 30 minutes at a time. Doing aerobics at home. No orthopnea, PND. Wearing CPAP 5x/week.  Taking meds as prescribed. Weights at home: slowly increasing since December. Last week or two has gone down some. She does not want to give me numbers. She is drinking more fluid than she's supposed to. Cooks and does good without adding salt. Still urinating a lot with 40 mg lasix daily. Per daughter, pt has been having problems since December with palpitations, SOB, and shakiness. Pt has been attributing this to anxiety. Has gained 20 pounds over past year.    CT 04/18/17 @ [facility] in [place], [place] 1. No acute abnormality 2. Intracranial atherosclerosis 3. Asymmetric volume loss and mild chronic small vessel ischemic changes in the left greater than right cerebral hemisphere  US carotid 04/19/17 @ [facility] in [place], [place] - No hemodynamically significant stenosis.   MRI Brain, without contrast 04/19/17 @ [facility] in [place], [place] 1. Asymmetrically diminished caliber of the L ICA. Poor visual of left MCA 2. Assymmetric volume loss in the left cerebral hemisphere 3. 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 Mild chronic small vessel ischemic changes in the left greater than right cerebral hemisphere  Echo 04/19/17 @ [facility] in [place], [place] LVEF 55-60%, Severe LAE, Mild/mod MR, Mild TR,     Review of systems complete and found to be negative unless listed in HPI.    Past Medical History:  Diagnosis Date  . A-fib (HCC)   .  CHF (congestive heart failure) (HCC)   . Essential hypertension   . HLD (hyperlipidemia)   . MR (mitral regurgitation)   . Panic attack   . Pulmonary hypertension   . TR (tricuspid regurgitation)     Current Outpatient Medications  Medication Sig Dispense Refill  . acetaminophen (TYLENOL) 650 MG CR tablet Take 1,300 mg by mouth every 8 (eight) hours as needed for pain.    [name] amiodarone (PACERONE) 200 MG tablet TAKE 1/2 TABLET BY MOUTH DAILY 15 tablet 1  . amLODipine (NORVASC)  5 MG tablet TAKE 1 TABLET BY MOUTH EVERY DAY 30 tablet 6  . aspirin 81 MG chewable tablet Chew 81 mg by mouth daily.    . Bioflavonoid Products (VITAMIN C/BIOFLAVONOIDS) 1000-25 MG TABS Take 1 tablet by mouth daily. Reported on 12/24/2015    . Calcium Carbonate-Vitamin D (CALCIUM-VITAMIN D) 500-200 MG-UNIT tablet Take 1 tablet by mouth daily.    . Cinnamon 500 MG capsule Take 500 mg by mouth daily.    . Coenzyme Q10 (COQ10) 100 MG CAPS Take 100 mg by mouth daily.     . DULoxetine (CYMBALTA) 30 MG capsule Take 30 mg by mouth daily.    [name] ELIQUIS 5 MG TABS tablet TAKE 1 TABLET BY MOUTH 2 TIMES A DAY 60 tablet 6  . ENTRESTO 97-103 MG TAKE 1 TABLET BY MOUTH 2 TIMES A DAY 60 tablet 3  . furosemide (LASIX) 40 MG tablet TAKE 1 TABLET BY MOUTH EVERY DAY 30 tablet 3  . gabapentin (NEURONTIN) 300 MG capsule Take 300 mg by mouth 2 (two) times daily.    [name] lactobacillus acidophilus & bulgar (LACTINEX) chewable tablet Chew 3 tablets by mouth at bedtime.     [name] LECITHIN PO Take 2 tablets by mouth daily.     [name] loratadine (CLARITIN) 10 MG tablet Take 10 mg by mouth daily as needed for allergies.    . Multiple Vitamins-Minerals (MULTIVITAMIN WITH MINERALS) tablet Take 1 tablet by mouth daily.    . Omega-3 Fatty Acids (FISH OIL) 1000 MG CAPS Take 1,000 mg by mouth daily.     . Red Yeast Rice 600 MG CAPS Take 600 mg by mouth daily.     [name] spironolactone (ALDACTONE) 25 MG tablet TAKE 1 TABLET BY MOUTH EVERY DAY AT NOON 90 tablet 3  . vitamin E 400 UNIT capsule Take 400 Units by mouth daily.      No current facility-administered medications for this encounter.     No Known Allergies    Social History   Socioeconomic History  . Marital status: Divorced    Spouse name: Not on file  . Number of children: Not on file  . Years of education: Not on file  . Highest education level: Not on file  Social Needs  . Financial resource strain: Not on file  . Food insecurity - worry: Not on file  . Food insecurity -  inability: Not on file  . Transportation needs - medical: Not on file  . Transportation needs - non-medical: Not on file  Occupational History  . Not on file  Tobacco Use  . Smoking status: Never Smoker  Substance and Sexual Activity  . Alcohol use: No  . Drug use: No  . Sexual activity: Not on file  Other Topics Concern  . Not on file  Social History Narrative  . Not on file     Family History  Problem Relation Age of Onset  . Breast cancer Mother   .  Hypertension Sister     Vitals:   12/22/17 0943  BP: 125/64  Pulse: 91  SpO2: 97%  Weight: 231 lb 1.9 oz (104.8 kg)    Wt Readings from Last 3 Encounters:  12/22/17 231 lb 1.9 oz (104.8 kg)  04/27/17 219 lb (99.3 kg)  11/26/16 209 lb (94.8 kg)    PHYSICAL EXAM: General: Elderly. Walks with cane. No resp difficulty. HEENT: Normal Neck: Supple. JVP 7. Carotids 2+ bilat; no bruits. No thyromegaly or nodule noted. Cor: PMI nondisplaced. Irregular rhythm, No M/G/R noted Split S2 Lungs: CTAB, normal effort. Abdomen: Marked centraSoft, non-tender, non-distended, no HSM. No bruits or masses. +BS  Extremities: No cyanosis, clubbing, or rash. R and LLE trace edema Neuro: Alert & orientedx3, cranial nerves grossly intact. moves all 4 extremities w/o difficulty. Affect pleasant  ECG: A fib, rate 93. Personally reviewed.  ASSESSMENT & PLAN:  1. Chronic systolic HF - Echo 12/31/15 EF 16-10%, Severe LAE, Mild AS, Mild MR; Echo 04/2017 LVEF 55-60%, Severe LAE, Mild/mod MR, Mild TR,  - NYHA III symptoms.  - Volume status stable. Weight is up 12 lbs, but does not appear to be fluid. - Taking 40 mg lasix daily. - Off Coreg with bradycardia. HR 90's today. - Continue Entresto 97/103 mg BID.   - Continue spironolactone 25 mg daily.  - Repeat echo next week - Reinforced fluid restriction to < 2 L daily, sodium restriction to less than 2000 mg daily, and the importance of daily weights.   2. Paroxysmal Afib S/p DCCV 12/25/15 -  Continue Eliquis 5 mg BID. Denies bleeding.   - Increase amiodarone to 200 mg BID. LFTs and TSHs 04/2017.  - Sounds like she may have converted to a fib in November/December. HR is irregular. EKG shows a fib 90's today. - A fib may be contributing to her symptoms. Plan for DC-CV in 2 weeks if echo looks okay. - Refer to a fib clinic 3. Mod MR/Mod TR  - Both mild on recent Echo in [place].   4. 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 TR Max PG 55.5 mm Hg by previous echo  - Peak PA pressure 50-60 by Echo at [facility].   5. HLD - Per PCP.  6. OSA - Continue CPAP nightly.  7. LBBB, 170 ms by echo today. - Recent Echo with improved EF. If EF trends back down in the future, she may benefit from CRT-D given wide LBBB.  8. Tachy-brady - Remains brady. Continue to follow for possible PPM.  9. Deconditioning - Continue to increase activity as able.  10. LLE wound - Resolved.  11. HTN - Continue amlodipine 5 mg daily with improved EF. She has tolerated well previously.  12. TIA - With admission in 2018. Work up with no acute changes, but chronic changes effecting left MCA territory.  - Continue Eliquis.  - Follows with Neuro   Increase amio to 200 mg BID Refer to a fib clinic Echo in 1 week, DC-CV in 2 weeks if echo looks good PT eval  Kelly Highland, NP  12/22/2017 10:09 AM   Patient seen and examined with the above-signed Advanced Practice Provider and/or Housestaff. I personally reviewed laboratory data, imaging studies and relevant notes. I independently examined the patient and formulated the important aspects of the plan. I have edited the note to reflect any of my changes or salient points. I have personally discussed the plan with the patient and/or family.  She has progressive NYHA III-IIIB  symptoms despite normalization of her EF. Suspect this is related to recurrent AF as well as her weight gain. Will increase amiodarone to 200 bid and plan DC-CV. Strongly reinforced need for  diet and weight loss.   Total time spent 35 minutes. Over half that time spent discussing above.   Kelly Meres, MD  3:44 PM

## 2017-12-22 NOTE — Patient Instructions (Signed)
Increase Amiodarone to 200 mg (1 tab) daily  Your physician has requested that you have an echocardiogram. Echocardiography is a painless test that uses sound waves to create images of your heart. It provides your doctor with information about the size and shape of your heart and how well your heart's chambers and valves are working. This procedure takes approximately one hour. There are no restrictions for this procedure.  Your physician has recommended that you have a Cardioversion (DCCV). Electrical Cardioversion uses a jolt of electricity to your heart either through paddles or wired patches attached to your chest. This is a controlled, usually prescheduled, procedure. Defibrillation is done under light anesthesia in the hospital, and you usually go home the day of the procedure. This is done to get your heart back into a normal rhythm. You are not awake for the procedure. Please see the instruction sheet given to you today.  You have been referred to [facility] have been referred to Physical Therapy  Your physician recommends that you schedule a follow-up appointment in: 3 months

## 2017-12-30 ENCOUNTER — Ambulatory Visit (HOSPITAL_COMMUNITY)
Admission: RE | Admit: 2017-12-30 | Discharge: 2017-12-30 | Disposition: A | Discharge status: Home / Self Care | Payer: Medicare Other | Source: Ambulatory Visit | Attending: Internal Medicine | Admitting: Internal Medicine

## 2017-12-30 DIAGNOSIS — I4891 Unspecified atrial fibrillation: Secondary | ICD-10-CM | POA: Diagnosis not present

## 2017-12-30 DIAGNOSIS — I5022 Chronic systolic (congestive) heart failure: Secondary | ICD-10-CM

## 2017-12-30 NOTE — Progress Notes (Signed)
  Echocardiogram 2D Echocardiogram has been performed.  Kelly Dickson F 12/30/2017, 4:20 PM

## 2018-01-04 ENCOUNTER — Ambulatory Visit (HOSPITAL_COMMUNITY): Payer: Medicare Other | Admitting: Anesthesiology

## 2018-01-04 ENCOUNTER — Encounter (HOSPITAL_COMMUNITY)
Admission: RE | Disposition: A | Discharge status: Home / Self Care | Payer: Self-pay | Source: Ambulatory Visit | Attending: Internal Medicine

## 2018-01-04 ENCOUNTER — Other Ambulatory Visit: Payer: Self-pay

## 2018-01-04 ENCOUNTER — Encounter (HOSPITAL_COMMUNITY): Payer: Self-pay | Admitting: *Deleted

## 2018-01-04 ENCOUNTER — Ambulatory Visit (HOSPITAL_COMMUNITY)
Admission: RE | Admit: 2018-01-04 | Discharge: 2018-01-04 | Disposition: A | Discharge status: Home / Self Care | Payer: Medicare Other | Source: Ambulatory Visit | Attending: Internal Medicine | Admitting: Internal Medicine

## 2018-01-04 ENCOUNTER — Other Ambulatory Visit (HOSPITAL_COMMUNITY): Payer: Self-pay | Admitting: Cardiology

## 2018-01-04 DIAGNOSIS — I480 Paroxysmal atrial fibrillation: Secondary | ICD-10-CM | POA: Diagnosis not present

## 2018-01-04 DIAGNOSIS — I2510 Atherosclerotic heart disease of native coronary artery without angina pectoris: Secondary | ICD-10-CM | POA: Diagnosis not present

## 2018-01-04 DIAGNOSIS — I5022 Chronic systolic (congestive) heart failure: Secondary | ICD-10-CM | POA: Diagnosis not present

## 2018-01-04 DIAGNOSIS — Z7901 Long term (current) use of anticoagulants: Secondary | ICD-10-CM | POA: Diagnosis not present

## 2018-01-04 DIAGNOSIS — I110 Hypertensive heart disease with heart failure: Secondary | ICD-10-CM | POA: Diagnosis not present

## 2018-01-04 DIAGNOSIS — I4891 Unspecified atrial fibrillation: Secondary | ICD-10-CM

## 2018-01-04 DIAGNOSIS — G4733 Obstructive sleep apnea (adult) (pediatric): Secondary | ICD-10-CM | POA: Insufficient documentation

## 2018-01-04 DIAGNOSIS — F410 Panic disorder [episodic paroxysmal anxiety] without agoraphobia: Secondary | ICD-10-CM | POA: Insufficient documentation

## 2018-01-04 DIAGNOSIS — I495 Sick sinus syndrome: Secondary | ICD-10-CM | POA: Diagnosis not present

## 2018-01-04 DIAGNOSIS — Z7982 Long term (current) use of aspirin: Secondary | ICD-10-CM | POA: Diagnosis not present

## 2018-01-04 DIAGNOSIS — Z8673 Personal history of transient ischemic attack (TIA), and cerebral infarction without residual deficits: Secondary | ICD-10-CM | POA: Diagnosis not present

## 2018-01-04 DIAGNOSIS — I272 Pulmonary hypertension, unspecified: Secondary | ICD-10-CM | POA: Diagnosis not present

## 2018-01-04 DIAGNOSIS — E785 Hyperlipidemia, unspecified: Secondary | ICD-10-CM | POA: Insufficient documentation

## 2018-01-04 DIAGNOSIS — Z79899 Other long term (current) drug therapy: Secondary | ICD-10-CM | POA: Diagnosis not present

## 2018-01-04 HISTORY — PX: CARDIOVERSION: SHX1299

## 2018-01-04 LAB — POCT I-STAT, CHEM 8
BUN: 28 mg/dL — ABNORMAL HIGH (ref 6–20)
CALCIUM ION: 1.19 mmol/L (ref 1.15–1.40)
CREATININE: 1 mg/dL (ref 0.44–1.00)
Chloride: 104 mmol/L (ref 101–111)
GLUCOSE: 119 mg/dL — AB (ref 65–99)
HCT: 37 % (ref 36.0–46.0)
Hemoglobin: 12.6 g/dL (ref 12.0–15.0)
Potassium: 4.3 mmol/L (ref 3.5–5.1)
Sodium: 141 mmol/L (ref 135–145)
TCO2: 26 mmol/L (ref 22–32)

## 2018-01-04 SURGERY — CARDIOVERSION
Anesthesia: General

## 2018-01-04 MED ORDER — LIDOCAINE HCL (CARDIAC) 20 MG/ML IV SOLN
INTRAVENOUS | Status: DC | PRN
  Administered 2018-01-04: 60 mg via INTRAVENOUS

## 2018-01-04 MED ORDER — PROPOFOL 10 MG/ML IV BOLUS
INTRAVENOUS | Status: AC
  Filled 2018-01-04: qty 40

## 2018-01-04 MED ORDER — SODIUM CHLORIDE 0.9 % IV SOLN
INTRAVENOUS | Status: DC | PRN
  Administered 2018-01-04: 12:00:00 via INTRAVENOUS

## 2018-01-04 MED ORDER — LACTATED RINGERS IV SOLN: INTRAVENOUS | Status: DC

## 2018-01-04 MED ORDER — PROPOFOL 10 MG/ML IV BOLUS
INTRAVENOUS | Status: DC | PRN
  Administered 2018-01-04: 60 mg via INTRAVENOUS

## 2018-01-04 NOTE — Transfer of Care (Signed)
Immediate Anesthesia Transfer of Care Note  Patient: Kelly Dickson  Procedure(s) Performed: CARDIOVERSION (N/A )  Patient Location: Endoscopy Unit  Anesthesia Type:General  Level of Consciousness: awake and patient cooperative  Airway & Oxygen Therapy: Patient Spontanous Breathing and Patient connected to nasal cannula oxygen  Post-op Assessment: Report given to RN and Post -op Vital signs reviewed and stable  Post vital signs: Reviewed and stable  Last Vitals:  Vitals:   01/04/18 1041  BP: (!) 118/58  Pulse: 61  Resp: (!) 22  SpO2: 98%    Last Pain: There were no vitals filed for this visit.       Complications: No apparent anesthesia complications

## 2018-01-04 NOTE — Anesthesia Preprocedure Evaluation (Addendum)
Anesthesia Evaluation  Patient identified by MRN, date of birth, ID band Patient awake    Reviewed: Allergy & Precautions, H&P , NPO status , Patient's Chart, lab work & pertinent test results  Airway Mallampati: II  TM Distance: >3 FB Neck ROM: Full    Dental no notable dental hx. (+) Teeth Intact, Dental Advisory Given   Pulmonary neg pulmonary ROS,    Pulmonary exam normal breath sounds clear to auscultation       Cardiovascular hypertension, +CHF  + dysrhythmias Atrial Fibrillation  Rhythm:Irregular Rate:Normal     Neuro/Psych Anxiety negative neurological ROS     GI/Hepatic negative GI ROS, Neg liver ROS,   Endo/Other  negative endocrine ROS  Renal/GU negative Renal ROS  negative genitourinary   Musculoskeletal   Abdominal   Peds  Hematology negative hematology ROS (+)   Anesthesia Other Findings   Reproductive/Obstetrics negative OB ROS                            Anesthesia Physical Anesthesia Plan  ASA: III  Anesthesia Plan: General   Post-op Pain Management:    Induction: Intravenous  PONV Risk Score and Plan: 3 and Treatment may vary due to age or medical condition  Airway Management Planned: Mask  Additional Equipment:   Intra-op Plan:   Post-operative Plan:   Informed Consent: I have reviewed the patients History and Physical, chart, labs and discussed the procedure including the risks, benefits and alternatives for the proposed anesthesia with the patient or authorized representative who has indicated his/her understanding and acceptance.   Dental advisory given  Plan Discussed with: CRNA  Anesthesia Plan Comments:         Anesthesia Quick Evaluation

## 2018-01-04 NOTE — Discharge Instructions (Signed)
Electrical Cardioversion, Care After °This sheet gives you information about how to care for yourself after your procedure. Your health care provider may also give you more specific instructions. If you have problems or questions, contact your health care provider. °What can I expect after the procedure? °After the procedure, it is common to have: °· Some redness on the skin where the shocks were given. ° °Follow these instructions at home: °· Do not drive for 24 hours if you were given a medicine to help you relax (sedative). °· Take over-the-counter and prescription medicines only as told by your health care provider. °· Ask your health care provider how to check your pulse. Check it often. °· Rest for 48 hours after the procedure or as told by your health care provider. °· Avoid or limit your caffeine use as told by your health care provider. °Contact a health care provider if: °· You feel like your heart is beating too quickly or your pulse is not regular. °· You have a serious muscle cramp that does not go away. °Get help right away if: °· You have discomfort in your chest. °· You are dizzy or you feel faint. °· You have trouble breathing or you are short of breath. °· Your speech is slurred. °· You have trouble moving an arm or leg on one side of your body. °· Your fingers or toes turn cold or blue. °This information is not intended to replace advice given to you by your health care provider. Make sure you discuss any questions you have with your health care provider. °Document Released: 08/09/2013 Document Revised: 05/22/2016 Document Reviewed: 04/24/2016 °Elsevier Interactive Patient Education © 2018 [employer] ° °

## 2018-01-04 NOTE — Anesthesia Postprocedure Evaluation (Signed)
Anesthesia Post Note  Patient: Kelly Dickson  Procedure(s) Performed: CARDIOVERSION (N/A )     Patient location during evaluation: PACU Anesthesia Type: General Level of consciousness: awake and alert Pain management: pain level controlled Vital Signs Assessment: post-procedure vital signs reviewed and stable Respiratory status: spontaneous breathing, nonlabored ventilation and respiratory function stable Cardiovascular status: blood pressure returned to baseline and stable Postop Assessment: no apparent nausea or vomiting Anesthetic complications: no    Last Vitals:  Vitals:   01/04/18 1235 01/04/18 1250  BP: (!) 121/50 (!) 128/49  Pulse:    Resp: 18 16  Temp: 36.7 C   SpO2: 98% 97%    Last Pain:  Vitals:   01/04/18 1235  TempSrc: Oral                 Rashod Gougeon,W. EDMOND

## 2018-01-04 NOTE — Interval H&P Note (Signed)
History and Physical Interval Note:  01/04/2018 12:08 PM  Kelly Dickson  has presented today for surgery, with the diagnosis of AFIB  The various methods of treatment have been discussed with the patient and family. After consideration of risks, benefits and other options for treatment, the patient has consented to  Procedure(s): CARDIOVERSION (N/A) as a surgical intervention .  The patient's history has been reviewed, patient examined, no change in status, stable for surgery.  I have reviewed the patient's chart and labs.  Questions were answered to the patient's satisfaction.     [name]

## 2018-01-04 NOTE — CV Procedure (Signed)
     DIRECT CURRENT CARDIOVERSION  NAME:  Kelly Dickson   MRN: [id number] DOB:  07/18/1946   ADMIT DATE: 01/04/2018   INDICATIONS: Atrial fibrillation    PROCEDURE:   Informed consent was obtained prior to the procedure. The risks, benefits and alternatives for the procedure were discussed and the patient comprehended these risks. Once an appropriate time out was taken, the patient had the defibrillator pads placed in the anterior and posterior position. The patient then underwent sedation by the anesthesia service. Once an appropriate level of sedation was achieved, the patient received a single biphasic, synchronized 200J shock with prompt conversion to sinus rhythm. No apparent complications.  Arvilla Meres, MD  12:30 PM

## 2018-01-05 ENCOUNTER — Encounter (HOSPITAL_COMMUNITY): Payer: Self-pay | Admitting: Internal Medicine

## 2018-01-27 ENCOUNTER — Encounter (HOSPITAL_COMMUNITY): Payer: Self-pay | Admitting: Nurse Practitioner

## 2018-01-27 ENCOUNTER — Ambulatory Visit (HOSPITAL_COMMUNITY)
Admission: RE | Admit: 2018-01-27 | Discharge: 2018-01-27 | Disposition: A | Discharge status: Home / Self Care | Payer: Medicare Other | Source: Ambulatory Visit | Attending: Nurse Practitioner | Admitting: Nurse Practitioner

## 2018-01-27 VITALS — BP 112/63 | HR 55 | Ht 63.0 in | Wt 231.1 lb

## 2018-01-27 DIAGNOSIS — E785 Hyperlipidemia, unspecified: Secondary | ICD-10-CM | POA: Insufficient documentation

## 2018-01-27 DIAGNOSIS — I110 Hypertensive heart disease with heart failure: Secondary | ICD-10-CM | POA: Insufficient documentation

## 2018-01-27 DIAGNOSIS — I509 Heart failure, unspecified: Secondary | ICD-10-CM | POA: Insufficient documentation

## 2018-01-27 DIAGNOSIS — Z6841 Body Mass Index (BMI) 40.0 and over, adult: Secondary | ICD-10-CM | POA: Insufficient documentation

## 2018-01-27 DIAGNOSIS — E669 Obesity, unspecified: Secondary | ICD-10-CM | POA: Diagnosis not present

## 2018-01-27 DIAGNOSIS — I480 Paroxysmal atrial fibrillation: Secondary | ICD-10-CM | POA: Diagnosis not present

## 2018-01-27 DIAGNOSIS — I5022 Chronic systolic (congestive) heart failure: Secondary | ICD-10-CM

## 2018-01-27 DIAGNOSIS — Z79899 Other long term (current) drug therapy: Secondary | ICD-10-CM | POA: Insufficient documentation

## 2018-01-27 DIAGNOSIS — I272 Pulmonary hypertension, unspecified: Secondary | ICD-10-CM | POA: Diagnosis not present

## 2018-01-27 DIAGNOSIS — F410 Panic disorder [episodic paroxysmal anxiety] without agoraphobia: Secondary | ICD-10-CM | POA: Diagnosis not present

## 2018-01-27 DIAGNOSIS — Z7982 Long term (current) use of aspirin: Secondary | ICD-10-CM | POA: Diagnosis not present

## 2018-01-27 DIAGNOSIS — Z7901 Long term (current) use of anticoagulants: Secondary | ICD-10-CM | POA: Diagnosis not present

## 2018-01-27 DIAGNOSIS — Z8673 Personal history of transient ischemic attack (TIA), and cerebral infarction without residual deficits: Secondary | ICD-10-CM | POA: Diagnosis not present

## 2018-01-27 MED ORDER — AMIODARONE HCL 200 MG PO TABS: 100.0000 mg | ORAL_TABLET | Freq: Every day | ORAL | 3 refills | Status: DC

## 2018-01-27 NOTE — Patient Instructions (Signed)
Decrease Amiodarone to 100mg  once a day (1/2 tablet of the 200mg  tab)

## 2018-01-28 ENCOUNTER — Encounter (INDEPENDENT_AMBULATORY_CARE_PROVIDER_SITE_OTHER): Payer: Self-pay

## 2018-01-28 NOTE — Progress Notes (Signed)
Primary Care Physician: Andreas Blower., MD Referring Physician: Dr. Estill Bamberg Kelly Dickson is a 72 y.o. female with a h/o obesity, paroxysmal atrial fibrillation, systolic HF(recovered EF), that is in the afib clinic for f/u of successful cardioversion early in March for going into afib in February. Pt states that she got into an argument with her daughter and got very upset which she feels was her trigger. Her amiodarone was 100 mg daily for years, increased to 200 mg bid for about one month before cardioversion and continues at 200 mg daily.    Today, she denies symptoms of palpitations, chest pain, shortness of breath, orthopnea, PND, lower extremity edema, dizziness, presyncope, syncope, or neurologic sequela. The patient is tolerating medications without difficulties and is otherwise without complaint today.   Past Medical History:  Diagnosis Date  . A-fib (HCC)   . CHF (congestive heart failure) (HCC)   . Essential hypertension   . HLD (hyperlipidemia)   . MR (mitral regurgitation)   . Panic attack   . Pulmonary hypertension (HCC)   . TR (tricuspid regurgitation)    Past Surgical History:  Procedure Laterality Date  . CARDIOVERSION N/A 12/25/2015   Procedure: CARDIOVERSION;  Surgeon: Dolores Patty, MD;  Location: [facility] ENDOSCOPY;  Service: Cardiovascular;  Laterality: N/A;  . CARDIOVERSION N/A 01/04/2018   Procedure: CARDIOVERSION;  Surgeon: Dolores Patty, MD;  Location: [facility] ENDOSCOPY;  Service: Cardiovascular;  Laterality: N/A;  . HERNIA REPAIR    . REPLACEMENT TOTAL KNEE    . TONSILLECTOMY      Current Outpatient Medications  Medication Sig Dispense Refill  . acetaminophen (TYLENOL) 650 MG CR tablet Take 1,300 mg by mouth every 8 (eight) hours as needed for pain.    [name] amiodarone (PACERONE) 200 MG tablet Take 0.5 tablets (100 mg total) by mouth daily. 30 tablet 3  . amLODipine (NORVASC) 5 MG tablet TAKE 1 TABLET BY MOUTH EVERY DAY 30 tablet 6  . aspirin 81  MG chewable tablet Chew 81 mg by mouth daily.    . Bioflavonoid Products (VITAMIN C/BIOFLAVONOIDS) 1000-25 MG TABS Take 1 tablet by mouth daily. Reported on 12/24/2015    . Calcium Carbonate-Vitamin D (CALCIUM-VITAMIN D) 500-200 MG-UNIT tablet Take 1 tablet by mouth daily.    . cholecalciferol (VITAMIN D) 1000 units tablet Take 2,000 Units by mouth daily.    . Cinnamon 500 MG capsule Take 500 mg by mouth daily.    . Coenzyme Q10 (COQ10) 100 MG CAPS Take 100 mg by mouth daily.     . DULoxetine (CYMBALTA) 30 MG capsule Take 30 mg by mouth daily.    [name] ELIQUIS 5 MG TABS tablet TAKE 1 TABLET BY MOUTH 2 TIMES A DAY 60 tablet 6  . ENTRESTO 97-103 MG TAKE 1 TABLET BY MOUTH 2 TIMES A DAY 60 tablet 3  . furosemide (LASIX) 40 MG tablet TAKE 1 TABLET BY MOUTH EVERY DAY 30 tablet 3  . gabapentin (NEURONTIN) 300 MG capsule Take 300 mg by mouth 2 (two) times daily as needed (joint pain).     [name] lactobacillus acidophilus & bulgar (LACTINEX) chewable tablet Chew 3 tablets by mouth at bedtime.     [name] LECITHIN PO Take 2 tablets by mouth daily.     . Liniments (SALONPAS ARTHRITIS PAIN RELIEF EX) Place 1 patch onto the skin daily as needed (pain).    [name] loratadine (CLARITIN) 10 MG tablet Take 10 mg by mouth daily as needed for allergies.    [name]  Menthol, Topical Analgesic, (BIOFREEZE EX) Apply 1 application topically 2 (two) times daily as needed (pain).    . Multiple Vitamins-Minerals (MULTIVITAMIN WITH MINERALS) tablet Take 1 tablet by mouth daily.    . Omega-3 Fatty Acids (FISH OIL) 1000 MG CAPS Take 1,000 mg by mouth daily.     . Red Yeast Rice 600 MG CAPS Take 600 mg by mouth daily.     [name] spironolactone (ALDACTONE) 25 MG tablet TAKE 1 TABLET BY MOUTH EVERY DAY AT NOON 90 tablet 3  . vitamin E 400 UNIT capsule Take 400 Units by mouth daily.      No current facility-administered medications for this encounter.     No Known Allergies  Social History   Socioeconomic History  . Marital status: Divorced    Spouse  name: Not on file  . Number of children: Not on file  . Years of education: Not on file  . Highest education level: Not on file  Occupational History  . Not on file  Social Needs  . Financial resource strain: Not on file  . Food insecurity:    Worry: Not on file    Inability: Not on file  . Transportation needs:    Medical: Not on file    Non-medical: Not on file  Tobacco Use  . Smoking status: Never Smoker  . Smokeless tobacco: Never Used  Substance and Sexual Activity  . Alcohol use: No  . Drug use: No  . Sexual activity: Not on file  Lifestyle  . Physical activity:    Days per week: Not on file    Minutes per session: Not on file  . Stress: Not on file  Relationships  . Social connections:    Talks on phone: Not on file    Gets together: Not on file    Attends religious service: Not on file    Active member of club or organization: Not on file    Attends meetings of clubs or organizations: Not on file    Relationship status: Not on file  . Intimate partner violence:    Fear of current or ex partner: Not on file    Emotionally abused: Not on file    Physically abused: Not on file    Forced sexual activity: Not on file  Other Topics Concern  . Not on file  Social History Narrative  . Not on file    Family History  Problem Relation Age of Onset  . Breast cancer Mother   . Hypertension Sister     ROS- All systems are reviewed and negative except as per the HPI above  Physical Exam: Vitals:   01/27/18 1516  BP: 112/63  Pulse: (!) 55  Weight: 231 lb 1.9 oz (104.8 kg)  Height: 5\' 3"  (1.6 m)   Wt Readings from Last 3 Encounters:  01/27/18 231 lb 1.9 oz (104.8 kg)  12/22/17 231 lb 1.9 oz (104.8 kg)  04/27/17 219 lb (99.3 kg)    Labs: Lab Results  Component Value Date   NA 141 01/04/2018   K 4.3 01/04/2018   CL 104 01/04/2018   CO2 24 12/22/2017   GLUCOSE 119 (H) 01/04/2018   BUN 28 (H) 01/04/2018   CREATININE 1.00 01/04/2018   CALCIUM 9.2  12/22/2017   MG 2.0 11/26/2015   Lab Results  Component Value Date   INR 1.18 12/22/2017   No results found for: CHOL, HDL, LDLCALC, TRIG   GEN- The patient is well appearing, alert and  oriented x 3 today.   Head- normocephalic, atraumatic Eyes-  Sclera clear, conjunctiva pink Ears- hearing intact Oropharynx- clear Neck- supple, no JVP Lymph- no cervical lymphadenopathy Lungs- Clear to ausculation bilaterally, normal work of breathing Heart- Regular rate and rhythm, no murmurs, rubs or gallops, PMI not laterally displaced GI- soft, NT, ND, + BS Extremities- no clubbing, cyanosis, or edema MS- no significant deformity or atrophy Skin- no rash or lesion Psych- euthymic mood, full affect Neuro- strength and sensation are intact  EKG-Sinus brady at 55 bpm, LBBB, Pr int 172, qrs int 156 ms, qtc 518 ms(stable) Epic records reviewed    Assessment and Plan: 1. Paroxysmal afib Episode of afib in February, trigger thought to be 2/2 argument with her daughter General eduction re afib and triggers Reloaded on amiodarone and successful cardiovertion Will try to reduce amiodarone back to pt's dose of 100 mg a day, currently at 200 mg daily Continue eliquis 5 mg bid, ASA 81 mg was added at time of TIA June 2018  afib clinic as needed  Elvina Sidle. [facility] [address] [place], [place] [address] [phone]

## 2018-02-04 ENCOUNTER — Other Ambulatory Visit (HOSPITAL_COMMUNITY): Payer: Self-pay | Admitting: *Deleted

## 2018-02-04 DIAGNOSIS — I5022 Chronic systolic (congestive) heart failure: Secondary | ICD-10-CM

## 2018-02-04 MED ORDER — AMIODARONE HCL 200 MG PO TABS: 100.0000 mg | ORAL_TABLET | Freq: Every day | ORAL | 3 refills | Status: DC

## 2018-03-23 ENCOUNTER — Ambulatory Visit (HOSPITAL_COMMUNITY)
Admission: RE | Admit: 2018-03-23 | Discharge: 2018-03-23 | Disposition: A | Discharge status: Home / Self Care | Payer: Medicare Other | Source: Ambulatory Visit | Attending: Internal Medicine | Admitting: Internal Medicine

## 2018-03-23 ENCOUNTER — Encounter (HOSPITAL_COMMUNITY): Payer: Self-pay | Admitting: Internal Medicine

## 2018-03-23 ENCOUNTER — Other Ambulatory Visit: Payer: Self-pay

## 2018-03-23 VITALS — BP 125/59 | HR 90 | Wt 223.8 lb

## 2018-03-23 DIAGNOSIS — Z79899 Other long term (current) drug therapy: Secondary | ICD-10-CM | POA: Diagnosis not present

## 2018-03-23 DIAGNOSIS — F410 Panic disorder [episodic paroxysmal anxiety] without agoraphobia: Secondary | ICD-10-CM | POA: Diagnosis not present

## 2018-03-23 DIAGNOSIS — Z8673 Personal history of transient ischemic attack (TIA), and cerebral infarction without residual deficits: Secondary | ICD-10-CM | POA: Insufficient documentation

## 2018-03-23 DIAGNOSIS — E669 Obesity, unspecified: Secondary | ICD-10-CM | POA: Diagnosis not present

## 2018-03-23 DIAGNOSIS — I4819 Other persistent atrial fibrillation: Secondary | ICD-10-CM

## 2018-03-23 DIAGNOSIS — I272 Pulmonary hypertension, unspecified: Secondary | ICD-10-CM | POA: Insufficient documentation

## 2018-03-23 DIAGNOSIS — I480 Paroxysmal atrial fibrillation: Secondary | ICD-10-CM | POA: Insufficient documentation

## 2018-03-23 DIAGNOSIS — G4733 Obstructive sleep apnea (adult) (pediatric): Secondary | ICD-10-CM | POA: Insufficient documentation

## 2018-03-23 DIAGNOSIS — I340 Nonrheumatic mitral (valve) insufficiency: Secondary | ICD-10-CM | POA: Diagnosis not present

## 2018-03-23 DIAGNOSIS — Z803 Family history of malignant neoplasm of breast: Secondary | ICD-10-CM | POA: Diagnosis not present

## 2018-03-23 DIAGNOSIS — I495 Sick sinus syndrome: Secondary | ICD-10-CM | POA: Diagnosis not present

## 2018-03-23 DIAGNOSIS — R5381 Other malaise: Secondary | ICD-10-CM | POA: Diagnosis not present

## 2018-03-23 DIAGNOSIS — I447 Left bundle-branch block, unspecified: Secondary | ICD-10-CM | POA: Diagnosis not present

## 2018-03-23 DIAGNOSIS — Z8249 Family history of ischemic heart disease and other diseases of the circulatory system: Secondary | ICD-10-CM | POA: Insufficient documentation

## 2018-03-23 DIAGNOSIS — I5022 Chronic systolic (congestive) heart failure: Secondary | ICD-10-CM | POA: Insufficient documentation

## 2018-03-23 DIAGNOSIS — I10 Essential (primary) hypertension: Secondary | ICD-10-CM

## 2018-03-23 DIAGNOSIS — I481 Persistent atrial fibrillation: Secondary | ICD-10-CM | POA: Diagnosis not present

## 2018-03-23 DIAGNOSIS — S81802A Unspecified open wound, left lower leg, initial encounter: Secondary | ICD-10-CM | POA: Diagnosis not present

## 2018-03-23 DIAGNOSIS — I110 Hypertensive heart disease with heart failure: Secondary | ICD-10-CM | POA: Diagnosis not present

## 2018-03-23 DIAGNOSIS — Z7982 Long term (current) use of aspirin: Secondary | ICD-10-CM | POA: Diagnosis not present

## 2018-03-23 DIAGNOSIS — Z6839 Body mass index (BMI) 39.0-39.9, adult: Secondary | ICD-10-CM | POA: Insufficient documentation

## 2018-03-23 DIAGNOSIS — E785 Hyperlipidemia, unspecified: Secondary | ICD-10-CM | POA: Insufficient documentation

## 2018-03-23 DIAGNOSIS — Z7901 Long term (current) use of anticoagulants: Secondary | ICD-10-CM | POA: Diagnosis not present

## 2018-03-23 LAB — BASIC METABOLIC PANEL
Anion gap: 9 (ref 5–15)
BUN: 28 mg/dL — ABNORMAL HIGH (ref 6–20)
CHLORIDE: 102 mmol/L (ref 101–111)
CO2: 30 mmol/L (ref 22–32)
Calcium: 9.4 mg/dL (ref 8.9–10.3)
Creatinine, Ser: 1.11 mg/dL — ABNORMAL HIGH (ref 0.44–1.00)
GFR calc Af Amer: 56 mL/min — ABNORMAL LOW (ref >60)
GFR calc non Af Amer: 48 mL/min — ABNORMAL LOW (ref >60)
Glucose, Bld: 110 mg/dL — ABNORMAL HIGH (ref 65–99)
Potassium: 4.6 mmol/L (ref 3.5–5.1)
Sodium: 141 mmol/L (ref 135–145)

## 2018-03-23 LAB — HEPATIC FUNCTION PANEL
ALBUMIN: 4.1 g/dL (ref 3.5–5.0)
ALK PHOS: 80 U/L (ref 38–126)
ALT: 12 U/L — AB (ref 14–54)
AST: 15 U/L (ref 15–41)
Bilirubin, Direct: <0.1 mg/dL — ABNORMAL LOW (ref 0.1–0.5)
TOTAL PROTEIN: 7.2 g/dL (ref 6.5–8.1)
Total Bilirubin: 0.9 mg/dL (ref 0.3–1.2)

## 2018-03-23 NOTE — Progress Notes (Signed)
Patient ID: Kelly Dickson, female   DOB: [birth year], 72 y.o.   MRN: [id number]    Advanced Heart Failure Clinic Note   Primary Care: Dr Luiz Iron Primary Cardiologist: Dr Willeen Cass Primary HF: Dr. Gala Romney   HPI: Kelly Dickson is a 72 y.o. female with obesity, PAF and systolic HF (with recovered ejection fraction)  Diagnosed with systolic HF in 2016 at [facility] EF 25-30%  We saw her for the first time in 2/17 and NICM felt to be due to OSA, LBBB and/or PAF. She was not tolerating AF well. So underwent DC-CV on 12/25/15. Post DC-CV EF and symptoms improved. Echo 12/31/15 EF 45-50%, Severe LAE, Mild AS, Mild MR  Admitted to [facility] in [place], [place] 04/2017 with symptoms concerning for TIA/CVA with RUE weakness that quickly resolved. Complete work up as below with no clear acute cause. Echo 55-60% with severe LAE and mild/mod MR.  Had recurrent AF on 01/04/18 and underwent DC-CV.    Today she returns for HF follow up. Overall feeling fine. Says she is trying to be more active. Going to Yoga class.  Denies PND/Orthopnea. Does get SOB with inclines.  Able to walk down the driveway without difficulty.  Denies CP or palpitations. Appetite ok. No fever or chills. Weight at home 218 pounds. Taking all medications. No bleeding issues. Trying to be mindful about her weight.  ECHO 12/2017 EF 35-40%, left atrium severely dilated.  Echo 04/19/17 @ [facility] in [place], [place] LVEF 55-60%, Severe LAE, Mild/mod MR, Mild TR,  Echo 12/31/15 EF 45-50%, Severe LAE, Mild AS, Mild MR Echo 05/23/15 [facility] LVEF 25-30%, RV mild/mod reduced, Mode LAE, mild RAE, Mod MR, Mod TR, RVSP(TR) 70.5 mm Hg  Myoview 05/14/15 - [facility] Mild ischemia in the apical septal region. EF ~ 36%.  LHC 06/27/15 - Dr Rolly Salter Willeen Cass - Right dominant anatomy - Left main - medium size and normal - LAD - medium caliber, 20% lesion in proximal portion - LCx - medium caliber, with few obtuse marginal branches but normal -  RCA - large and dominant artery, without signifcant disease.  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 No acute abnormality 2. Intracranial atherosclerosis 3. Asymmetric volume loss and mild chronic small vessel ischemic changes in the left greater than right cerebral hemisphere  US carotid 04/19/17 @ [facility] in [place], [place] - No hemodynamically significant stenosis.   MRI Brain, without contrast 04/19/17 @ [facility] in [place], [place] 1. Asymmetrically diminished caliber of the L ICA. Poor visual of left MCA 2. Assymmetric volume loss in the left cerebral hemisphere 3. 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 Mild chronic small vessel ischemic changes in the left greater than right cerebral hemisphere   Review of systems complete and found to be negative unless listed in HPI.    Past Medical History:  Diagnosis Date  . A-fib (HCC)   . CHF (congestive heart failure) (HCC)   . Essential hypertension   . HLD (hyperlipidemia)   . MR (mitral regurgitation)   . Panic attack   . Pulmonary hypertension (HCC)   . TR (tricuspid regurgitation)     Current Outpatient Medications  Medication Sig Dispense Refill  . acetaminophen (TYLENOL) 650 MG CR tablet Take 1,300 mg by mouth every 8 (eight) hours as needed for pain.    [name] amiodarone (PACERONE) 200 MG tablet Take 0.5 tablets (100  mg total) by mouth daily. 15 tablet 3  . amLODipine (NORVASC) 5 MG tablet TAKE 1 TABLET BY MOUTH EVERY DAY 30 tablet 6  . aspirin 81 MG chewable tablet Chew 81 mg by mouth daily.    . Bioflavonoid Products (VITAMIN C/BIOFLAVONOIDS) 1000-25 MG TABS Take 1 tablet by mouth daily. Reported on 12/24/2015    . Calcium Carbonate-Vitamin D (CALCIUM-VITAMIN D) 500-200 MG-UNIT tablet Take 1 tablet by mouth daily.    . cholecalciferol (VITAMIN D) 1000 units tablet Take 2,000 Units by mouth daily.    . Coenzyme Q10  (COQ10) 100 MG CAPS Take 100 mg by mouth daily.     . DULoxetine (CYMBALTA) 30 MG capsule Take 30 mg by mouth daily.    [name] ELIQUIS 5 MG TABS tablet TAKE 1 TABLET BY MOUTH 2 TIMES A DAY 60 tablet 6  . ENTRESTO 97-103 MG TAKE 1 TABLET BY MOUTH 2 TIMES A DAY 60 tablet 3  . furosemide (LASIX) 40 MG tablet TAKE 1 TABLET BY MOUTH EVERY DAY 30 tablet 3  . gabapentin (NEURONTIN) 300 MG capsule Take 300 mg by mouth 2 (two) times daily as needed (joint pain).     [name] lactobacillus acidophilus & bulgar (LACTINEX) chewable tablet Chew 3 tablets by mouth at bedtime.     [name] LECITHIN PO Take 2 tablets by mouth daily.     . Liniments (SALONPAS ARTHRITIS PAIN RELIEF EX) Place 1 patch onto the skin daily as needed (pain).    [name] loratadine (CLARITIN) 10 MG tablet Take 10 mg by mouth daily as needed for allergies.    . Menthol, Topical Analgesic, (BIOFREEZE EX) Apply 1 application topically 2 (two) times daily as needed (pain).    . Multiple Vitamins-Minerals (MULTIVITAMIN WITH MINERALS) tablet Take 1 tablet by mouth daily.    . Omega-3 Fatty Acids (FISH OIL) 1000 MG CAPS Take 1,000 mg by mouth daily.     . Red Yeast Rice 600 MG CAPS Take 600 mg by mouth daily.     [name] spironolactone (ALDACTONE) 25 MG tablet TAKE 1 TABLET BY MOUTH EVERY DAY AT NOON 90 tablet 3  . vitamin E 400 UNIT capsule Take 400 Units by mouth daily.     . Cinnamon 500 MG capsule Take 500 mg by mouth daily.     No current facility-administered medications for this encounter.     No Known Allergies    Social History   Socioeconomic History  . Marital status: Divorced    Spouse name: Not on file  . Number of children: Not on file  . Years of education: Not on file  . Highest education level: Not on file  Occupational History  . Not on file  Social Needs  . Financial resource strain: Not on file  . Food insecurity:    Worry: Not on file    Inability: Not on file  . Transportation needs:    Medical: Not on file    Non-medical: Not on  file  Tobacco Use  . Smoking status: Never Smoker  . Smokeless tobacco: Never Used  Substance and Sexual Activity  . Alcohol use: No  . Drug use: No  . Sexual activity: Not on file  Lifestyle  . Physical activity:    Days per week: Not on file    Minutes per session: Not on file  . Stress: Not on file  Relationships  . Social connections:    Talks on phone: Not on file    Gets together:  Not on file    Attends religious service: Not on file    Active member of club or organization: Not on file    Attends meetings of clubs or organizations: Not on file    Relationship status: Not on file  . Intimate partner violence:    Fear of current or ex partner: Not on file    Emotionally abused: Not on file    Physically abused: Not on file    Forced sexual activity: Not on file  Other Topics Concern  . Not on file  Social History Narrative  . Not on file     Family History  Problem Relation Age of Onset  . Breast cancer Mother   . Hypertension Sister     Vitals:   03/23/18 0947  BP: (!) 125/59  Pulse: 90  SpO2: 97%  Weight: 223 lb 12.8 oz (101.5 kg)    Wt Readings from Last 3 Encounters:  03/23/18 223 lb 12.8 oz (101.5 kg)  01/27/18 231 lb 1.9 oz (104.8 kg)  12/22/17 231 lb 1.9 oz (104.8 kg)    PHYSICAL EXAM: General:  Well appearing. No resp difficulty HEENT: normal anicteric Neck: supple. JVP 5-6. Carotids 2+ bilat; no bruits. No lymphadenopathy or thryomegaly appreciated. Cor: PMI nondisplaced. irregular rate & rhythm. No rubs, gallops or murmurs. Lungs: clear no wheeze Abdomen: obese soft, nontender, nondistended. No hepatosplenomegaly. No bruits or masses. Good bowel sounds. Extremities: no cyanosis, clubbing, rash, R and LLE 1+ edema Neuro: alert & orientedx3, cranial nerves grossly intact. moves all 4 extremities w/o difficulty. Affect pleasant  ECG:  A Fib 87 bpm  LBBB (160 ms) Personally reviewed   ASSESSMENT & PLAN:  1. Chronic systolic HF - Echo  12/31/15 EF 45-50%, Severe LAE, Mild AS, Mild MR; Echo 04/2017 LVEF 55-60%, Severe LAE, Mild/mod MR, Mild TR,  - 12/2017 ECHO EF 35-40%  - NYHA III. Volume status stable. Taking 40 mg lasix daily. - Off Coreg with bradycardia.  - Continue Entresto 97/103 mg BID.   - Continue spironolactone 25 mg daily.  2. Paroxysmal Afib  S/p DCCV 12/25/15 and 12/2017 - Back in A fib today.  - Continue Eliquis 5 mg BID. Denies bleeding.   -May need to stop amio with recurrent A fib. Rate controlled.   LFTs and TSHs 04/2017.  3. Mod MR/Mod TR  - Both mild on recent Echo in [place].   4. 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 TR Max PG 55.5 mm Hg by previous echo  - Peak PA pressure 50-60 by Echo at [facility].   5. HLD - Per PCP.  6. OSA - Continue CPAP nightly.  7. LBBB, 160 ms on ECHO 2019  8. Tachy-brady - Remains brady. Continue to follow for possible PPM.  9. Deconditioning - Continue to increase activity as able.  10. LLE wound - Resolved.  11. HTN -Stable.  12. TIA - With admission in 2018. Work up with no acute changes, but chronic changes effecting left MCA territory.  - Continue Eliquis.  - Follows with Neuro  13. Obesity Body mass index is 39.64 kg/m.   Tonye Becket, NP  03/23/2018 10:15 AM   Patient seen and examined with Tonye Becket, NP. We discussed all aspects of the encounter. I agree with the assessment and plan as stated above.   She is back in A fib today after repeat DC-CV in 3/19. Currently asymptomatic (she was symptomatic in past). Given severe LAE likelihood of maintaining NSR very  low. Will stop ami and focus on rate control.   HF stable. 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 NYHA II-III. Continue CPAP. Encouraged exercise and weight loss. Will follow EF closely, if not improving may need to consider CRT-P in setting of wide LBBB (160 ms).   Arvilla Meres, MD  9:10 PM

## 2018-03-23 NOTE — Patient Instructions (Signed)
Stop Amiodorone  Labs drawn today (if we do not call you, then your lab work was stable)   Your physician recommends that you schedule a follow-up appointment in: 4 weeks with Dr. Gala Romney  an a echocardiogram

## 2018-04-04 ENCOUNTER — Telehealth (HOSPITAL_COMMUNITY): Payer: Self-pay | Admitting: Vascular Surgery

## 2018-04-04 NOTE — Telephone Encounter (Signed)
Pt message to resch echo /db appt due to db not being in office 6/18

## 2018-04-11 ENCOUNTER — Telehealth (HOSPITAL_COMMUNITY): Payer: Self-pay | Admitting: *Deleted

## 2018-04-11 NOTE — Telephone Encounter (Signed)
Pt daughter Kelly Dickson called to report that patient is currently in [place] and not doing too well. Patient was in afib during her office visit on 5/22 but was asymptomatic. Patient has been "throwing up", confused, sweating, swelling in ankles and feet. Kelly Dickson reported she can barely walk she fell but landed in a chair. Patient is supposed to travel back home tonight from [place]. She has an echo and appt with Dr.Bensimhon on Monday. Per Herbert Seta patient needs to be evaluated in an emergency room for afib and chf. Traveling will only make her swelling and symptoms worse. Kelly Dickson is aware and agreeable and will have her sister [name] take patient to an emergency room in [place].

## 2018-04-11 NOTE — Telephone Encounter (Signed)
Patient daughter called back stating patient did not want to go to the ED in [place] she wanted to come back to [place].Patient is established with the afib clinic and pts daughter asked if she could be seen by them since Dr.Bensimhon is not in clinic tomorrow. Spoke with Kennyth Arnold and added patient on for an appt at 1:45pm tomorrow. Daughter aware and agreeable with plan.

## 2018-04-12 ENCOUNTER — Emergency Department (HOSPITAL_COMMUNITY): Payer: Medicare Other

## 2018-04-12 ENCOUNTER — Telehealth (HOSPITAL_COMMUNITY): Payer: Self-pay | Admitting: *Deleted

## 2018-04-12 ENCOUNTER — Telehealth (HOSPITAL_COMMUNITY): Payer: Self-pay | Admitting: Vascular Surgery

## 2018-04-12 ENCOUNTER — Inpatient Hospital Stay (HOSPITAL_COMMUNITY)
Admission: EM | Admit: 2018-04-12 | Discharge: 2018-04-14 | DRG: 682 | Disposition: A | Discharge status: Skilled Nursing Facility | Payer: Medicare Other | Attending: Internal Medicine | Admitting: Internal Medicine

## 2018-04-12 ENCOUNTER — Inpatient Hospital Stay (HOSPITAL_COMMUNITY): Admission: RE | Admit: 2018-04-12 | Payer: Medicare Other | Source: Ambulatory Visit | Admitting: Nurse Practitioner

## 2018-04-12 DIAGNOSIS — G609 Hereditary and idiopathic neuropathy, unspecified: Secondary | ICD-10-CM | POA: Diagnosis present

## 2018-04-12 DIAGNOSIS — I081 Rheumatic disorders of both mitral and tricuspid valves: Secondary | ICD-10-CM | POA: Diagnosis present

## 2018-04-12 DIAGNOSIS — T368X5A Adverse effect of other systemic antibiotics, initial encounter: Secondary | ICD-10-CM | POA: Diagnosis present

## 2018-04-12 DIAGNOSIS — I5022 Chronic systolic (congestive) heart failure: Secondary | ICD-10-CM | POA: Diagnosis present

## 2018-04-12 DIAGNOSIS — N179 Acute kidney failure, unspecified: Secondary | ICD-10-CM | POA: Diagnosis present

## 2018-04-12 DIAGNOSIS — Z8249 Family history of ischemic heart disease and other diseases of the circulatory system: Secondary | ICD-10-CM

## 2018-04-12 DIAGNOSIS — R471 Dysarthria and anarthria: Secondary | ICD-10-CM | POA: Diagnosis present

## 2018-04-12 DIAGNOSIS — Z7982 Long term (current) use of aspirin: Secondary | ICD-10-CM

## 2018-04-12 DIAGNOSIS — L03116 Cellulitis of left lower limb: Secondary | ICD-10-CM | POA: Diagnosis present

## 2018-04-12 DIAGNOSIS — Z7901 Long term (current) use of anticoagulants: Secondary | ICD-10-CM

## 2018-04-12 DIAGNOSIS — I272 Pulmonary hypertension, unspecified: Secondary | ICD-10-CM | POA: Diagnosis present

## 2018-04-12 DIAGNOSIS — I6522 Occlusion and stenosis of left carotid artery: Secondary | ICD-10-CM | POA: Diagnosis present

## 2018-04-12 DIAGNOSIS — I480 Paroxysmal atrial fibrillation: Secondary | ICD-10-CM | POA: Diagnosis present

## 2018-04-12 DIAGNOSIS — Z8673 Personal history of transient ischemic attack (TIA), and cerebral infarction without residual deficits: Secondary | ICD-10-CM

## 2018-04-12 DIAGNOSIS — R278 Other lack of coordination: Secondary | ICD-10-CM | POA: Diagnosis present

## 2018-04-12 DIAGNOSIS — F410 Panic disorder [episodic paroxysmal anxiety] without agoraphobia: Secondary | ICD-10-CM | POA: Diagnosis present

## 2018-04-12 DIAGNOSIS — E875 Hyperkalemia: Secondary | ICD-10-CM

## 2018-04-12 DIAGNOSIS — I10 Essential (primary) hypertension: Secondary | ICD-10-CM | POA: Diagnosis not present

## 2018-04-12 DIAGNOSIS — G9341 Metabolic encephalopathy: Secondary | ICD-10-CM | POA: Diagnosis present

## 2018-04-12 DIAGNOSIS — I110 Hypertensive heart disease with heart failure: Secondary | ICD-10-CM | POA: Diagnosis present

## 2018-04-12 DIAGNOSIS — G459 Transient cerebral ischemic attack, unspecified: Secondary | ICD-10-CM

## 2018-04-12 DIAGNOSIS — G8331 Monoplegia, unspecified affecting right dominant side: Secondary | ICD-10-CM | POA: Diagnosis present

## 2018-04-12 DIAGNOSIS — I428 Other cardiomyopathies: Secondary | ICD-10-CM | POA: Diagnosis present

## 2018-04-12 DIAGNOSIS — I447 Left bundle-branch block, unspecified: Secondary | ICD-10-CM | POA: Diagnosis present

## 2018-04-12 DIAGNOSIS — E785 Hyperlipidemia, unspecified: Secondary | ICD-10-CM | POA: Diagnosis present

## 2018-04-12 DIAGNOSIS — I481 Persistent atrial fibrillation: Secondary | ICD-10-CM | POA: Diagnosis not present

## 2018-04-12 DIAGNOSIS — Z79899 Other long term (current) drug therapy: Secondary | ICD-10-CM | POA: Diagnosis not present

## 2018-04-12 LAB — COMPREHENSIVE METABOLIC PANEL
ALBUMIN: 3.4 g/dL — AB (ref 3.5–5.0)
ALT: 12 U/L — ABNORMAL LOW (ref 14–54)
AST: 16 U/L (ref 15–41)
Alkaline Phosphatase: 65 U/L (ref 38–126)
Anion gap: 8 (ref 5–15)
BILIRUBIN TOTAL: 0.5 mg/dL (ref 0.3–1.2)
BUN: 93 mg/dL — AB (ref 6–20)
CO2: 19 mmol/L — ABNORMAL LOW (ref 22–32)
CREATININE: 2.42 mg/dL — AB (ref 0.44–1.00)
Calcium: 8.7 mg/dL — ABNORMAL LOW (ref 8.9–10.3)
Chloride: 112 mmol/L — ABNORMAL HIGH (ref 101–111)
GFR calc Af Amer: 22 mL/min — ABNORMAL LOW (ref >60)
GFR, EST NON AFRICAN AMERICAN: 19 mL/min — AB (ref >60)
GLUCOSE: 106 mg/dL — AB (ref 65–99)
Potassium: 5.4 mmol/L — ABNORMAL HIGH (ref 3.5–5.1)
Sodium: 139 mmol/L (ref 135–145)
TOTAL PROTEIN: 6.4 g/dL — AB (ref 6.5–8.1)

## 2018-04-12 LAB — DIFFERENTIAL
ABS IMMATURE GRANULOCYTES: 0 10*3/uL (ref 0.0–0.1)
Basophils Absolute: 0 10*3/uL (ref 0.0–0.1)
Basophils Relative: 0 %
Eosinophils Absolute: 0.1 10*3/uL (ref 0.0–0.7)
Eosinophils Relative: 1 %
IMMATURE GRANULOCYTES: 0 %
LYMPHS ABS: 1.6 10*3/uL (ref 0.7–4.0)
LYMPHS PCT: 22 %
MONOS PCT: 9 %
Monocytes Absolute: 0.7 10*3/uL (ref 0.1–1.0)
NEUTROS ABS: 4.8 10*3/uL (ref 1.7–7.7)
Neutrophils Relative %: 68 %

## 2018-04-12 LAB — PROTIME-INR
INR: 1.25
PROTHROMBIN TIME: 15.6 s — AB (ref 11.4–15.2)

## 2018-04-12 LAB — CBC
HEMATOCRIT: 33 % — AB (ref 36.0–46.0)
HEMOGLOBIN: 10 g/dL — AB (ref 12.0–15.0)
MCH: 28.2 pg (ref 26.0–34.0)
MCHC: 30.3 g/dL (ref 30.0–36.0)
MCV: 93.2 fL (ref 78.0–100.0)
Platelets: 223 10*3/uL (ref 150–400)
RBC: 3.54 MIL/uL — AB (ref 3.87–5.11)
RDW: 15.5 % (ref 11.5–15.5)
WBC: 7.2 10*3/uL (ref 4.0–10.5)

## 2018-04-12 LAB — PHOSPHORUS: Phosphorus: 4 mg/dL (ref 2.5–4.6)

## 2018-04-12 LAB — AMMONIA: AMMONIA: 13 umol/L (ref 9–35)

## 2018-04-12 LAB — MAGNESIUM: Magnesium: 2.6 mg/dL — ABNORMAL HIGH (ref 1.7–2.4)

## 2018-04-12 LAB — URINALYSIS, ROUTINE W REFLEX MICROSCOPIC
Bilirubin Urine: NEGATIVE
GLUCOSE, UA: NEGATIVE mg/dL
HGB URINE DIPSTICK: NEGATIVE
Ketones, ur: NEGATIVE mg/dL
LEUKOCYTES UA: NEGATIVE
Nitrite: NEGATIVE
PROTEIN: NEGATIVE mg/dL
Specific Gravity, Urine: 1.012 (ref 1.005–1.030)
pH: 5 (ref 5.0–8.0)

## 2018-04-12 LAB — TSH: TSH: 2.651 u[IU]/mL (ref 0.350–4.500)

## 2018-04-12 LAB — ETHANOL: Alcohol, Ethyl (B): <10 mg/dL (ref <10)

## 2018-04-12 LAB — I-STAT CG4 LACTIC ACID, ED: LACTIC ACID, VENOUS: 0.61 mmol/L (ref 0.5–1.9)

## 2018-04-12 LAB — APTT: aPTT: 29 seconds (ref 24–36)

## 2018-04-12 LAB — RAPID URINE DRUG SCREEN, HOSP PERFORMED
Amphetamines: NOT DETECTED
BENZODIAZEPINES: NOT DETECTED
Barbiturates: NOT DETECTED
COCAINE: NOT DETECTED
Opiates: NOT DETECTED
TETRAHYDROCANNABINOL: NOT DETECTED

## 2018-04-12 LAB — I-STAT TROPONIN, ED: TROPONIN I, POC: 0 ng/mL (ref 0.00–0.08)

## 2018-04-12 LAB — HEMOGLOBIN A1C
Hgb A1c MFr Bld: 6.1 % — ABNORMAL HIGH (ref 4.8–5.6)
Mean Plasma Glucose: 128.37 mg/dL

## 2018-04-12 LAB — VITAMIN B12: Vitamin B-12: 235 pg/mL (ref 180–914)

## 2018-04-12 MED ORDER — ASPIRIN 81 MG PO CHEW
81.0000 mg | CHEWABLE_TABLET | Freq: Every day | ORAL | Status: DC
  Administered 2018-04-12 – 2018-04-14 (×3): 81 mg via ORAL
  Filled 2018-04-12 (×3): qty 1

## 2018-04-12 MED ORDER — GABAPENTIN 100 MG PO CAPS
100.0000 mg | ORAL_CAPSULE | Freq: Every day | ORAL | Status: DC
  Administered 2018-04-12 – 2018-04-13 (×2): 100 mg via ORAL
  Filled 2018-04-12 (×2): qty 1

## 2018-04-12 MED ORDER — SODIUM CHLORIDE 0.9 % IV BOLUS
500.0000 mL | Freq: Once | INTRAVENOUS | Status: AC
  Administered 2018-04-12: 500 mL via INTRAVENOUS

## 2018-04-12 MED ORDER — ACETAMINOPHEN 650 MG RE SUPP: 650.0000 mg | Freq: Four times a day (QID) | RECTAL | Status: DC | PRN

## 2018-04-12 MED ORDER — CALCIUM CARBONATE-VITAMIN D 500-200 MG-UNIT PO TABS
1.0000 | ORAL_TABLET | Freq: Every day | ORAL | Status: DC
  Administered 2018-04-13 – 2018-04-14 (×2): 1 via ORAL
  Filled 2018-04-12 (×2): qty 1

## 2018-04-12 MED ORDER — SODIUM CHLORIDE 0.9% FLUSH
3.0000 mL | Freq: Two times a day (BID) | INTRAVENOUS | Status: DC
  Administered 2018-04-12 – 2018-04-14 (×5): 3 mL via INTRAVENOUS

## 2018-04-12 MED ORDER — DULOXETINE HCL 30 MG PO CPEP
30.0000 mg | ORAL_CAPSULE | Freq: Every day | ORAL | Status: DC
  Administered 2018-04-13 – 2018-04-14 (×2): 30 mg via ORAL
  Filled 2018-04-12 (×2): qty 1

## 2018-04-12 MED ORDER — APIXABAN 2.5 MG PO TABS
2.5000 mg | ORAL_TABLET | Freq: Two times a day (BID) | ORAL | Status: DC
  Administered 2018-04-12 – 2018-04-13 (×2): 2.5 mg via ORAL
  Filled 2018-04-12 (×2): qty 1

## 2018-04-12 MED ORDER — DOCUSATE SODIUM 100 MG PO CAPS
100.0000 mg | ORAL_CAPSULE | Freq: Two times a day (BID) | ORAL | Status: DC
  Administered 2018-04-12 – 2018-04-13 (×3): 100 mg via ORAL
  Filled 2018-04-12 (×5): qty 1

## 2018-04-12 MED ORDER — LACTATED RINGERS IV SOLN
INTRAVENOUS | Status: AC
  Administered 2018-04-12: 22:00:00 via INTRAVENOUS

## 2018-04-12 MED ORDER — ADULT MULTIVITAMIN W/MINERALS CH
1.0000 | ORAL_TABLET | Freq: Every day | ORAL | Status: DC
  Administered 2018-04-13 – 2018-04-14 (×2): 1 via ORAL
  Filled 2018-04-12 (×2): qty 1

## 2018-04-12 MED ORDER — ONDANSETRON HCL 4 MG PO TABS: 4.0000 mg | ORAL_TABLET | Freq: Four times a day (QID) | ORAL | Status: DC | PRN

## 2018-04-12 MED ORDER — SENNA 8.6 MG PO TABS
1.0000 | ORAL_TABLET | Freq: Two times a day (BID) | ORAL | Status: DC
  Administered 2018-04-12 – 2018-04-13 (×3): 8.6 mg via ORAL
  Filled 2018-04-12 (×5): qty 1

## 2018-04-12 MED ORDER — OMEGA-3-ACID ETHYL ESTERS 1 G PO CAPS
1.0000 g | ORAL_CAPSULE | Freq: Every day | ORAL | Status: DC
  Administered 2018-04-13 – 2018-04-14 (×2): 1 g via ORAL
  Filled 2018-04-12 (×2): qty 1

## 2018-04-12 MED ORDER — POLYETHYLENE GLYCOL 3350 17 G PO PACK: 17.0000 g | PACK | Freq: Every day | ORAL | Status: DC | PRN

## 2018-04-12 MED ORDER — ACETAMINOPHEN 325 MG PO TABS
650.0000 mg | ORAL_TABLET | Freq: Four times a day (QID) | ORAL | Status: DC | PRN
  Administered 2018-04-13 – 2018-04-14 (×3): 650 mg via ORAL
  Filled 2018-04-12 (×3): qty 2

## 2018-04-12 MED ORDER — STROKE: EARLY STAGES OF RECOVERY BOOK
Freq: Once | Status: AC
  Administered 2018-04-12: 22:00:00

## 2018-04-12 MED ORDER — ROSUVASTATIN CALCIUM 20 MG PO TABS
40.0000 mg | ORAL_TABLET | Freq: Every day | ORAL | Status: DC
  Administered 2018-04-12 – 2018-04-13 (×2): 40 mg via ORAL
  Filled 2018-04-12 (×3): qty 2

## 2018-04-12 MED ORDER — ONDANSETRON HCL 4 MG/2ML IJ SOLN: 4.0000 mg | Freq: Four times a day (QID) | INTRAMUSCULAR | Status: DC | PRN

## 2018-04-12 NOTE — ED Provider Notes (Signed)
[facility] EMERGENCY DEPARTMENT Provider Note   CSN: [id number] Arrival date & time: 04/12/18  1405     History   Chief Complaint Chief Complaint  Patient presents with  . Medication Reaction    HPI LOU IRIGOYEN is a 72 y.o. female.  Patient with history of TIA in 2018, paroxysmal A. fib currently on Eliquis, discontinued on amio in 03/2018, congestive heart failure on Lasix --presents to the emergency department today with multiple symptoms.  Patient was visiting her daughter in [place] and developed a blister on the back of her left ankle.  She was seen at an outside urgent care where she had the area drained, culture performed, and was started on Bactrim.  She was given a 10-day course.  Patient was later told that this was a staph infection.  Approximally 5 days ago patient was writing some notes and letters and was having trouble writing.  She is also having some intermittent episodes of slurred speech.  Symptoms wax and wanes over the weekend and the patient requested to be returned home to [place] to be seen by her doctors.  She follows with Dr. Gala Romney and as well as the A. fib clinic.  Patient was scheduled to follow-up with the A. fib clinic today at 1:45 PM, however patient had more difficulty speaking today and getting her words started while talking on the phone with her daughter.  Daughter called EMS for transport to the hospital.  Patient states that overall her writing has improved.  Currently she feels tired but is not having any speech difficulties now.  No chest pain or shortness of breath.  Patient does not feel like her left leg wound is much better.  She continues to keep it wrapped.  Family is concerned that may be the Bactrim that she is on is causing her symptoms or possibly the fact that she was taken off of the amiodarone. The onset of this condition was acute. The course is waxing and waning. Aggravating factors: none. Alleviating factors:  none.   Work-up from [facility] for TIA admission (per Dr. Prescott Gum note):   CT 04/18/17 @ [facility] in [place], [place] 1. No acute abnormality 2. Intracranial atherosclerosis 3. Asymmetric volume loss and mild chronic small vessel ischemic changes in the left greater than right cerebral hemisphere  US carotid 04/19/17 @ [facility] in [place], [place] - No hemodynamically significant stenosis.   MRI Brain, without contrast 04/19/17 @ [facility] in [place], [place] 1. Asymmetrically diminished caliber of the L ICA. Poor visual of left MCA 2. Assymmetric volume loss in the left cerebral hemisphere 3. 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 Mild chronic small vessel ischemic changes in the left greater than right cerebral hemisphere  Echo 04/19/17 @ [facility] in [place], [place] LVEF 55-60%, Severe LAE, Mild/mod MR, Mild TR.       Past Medical History:  Diagnosis Date  . A-fib (HCC)   . CHF (congestive heart failure) (HCC)   . Essential hypertension   . HLD (hyperlipidemia)   . MR (mitral regurgitation)   . Panic attack   . Pulmonary hypertension (HCC)   . TR (tricuspid regurgitation)     Patient Active Problem List   Diagnosis Date Noted  . TIA (transient ischemic attack) 04/27/2017  . Bradycardia 01/09/2016  . PAF (paroxysmal atrial fibrillation) (HCC)   . LBBB (left bundle branch block) 12/22/2015  . Leg swelling 11/25/2015  .  Cellulitis of leg, left 11/25/2015  . Panic attack   . CHF (congestive heart failure) (HCC)   . Essential hypertension   . MR (mitral regurgitation)   . TR (tricuspid regurgitation)   . Pulmonary hypertension (HCC)   . HLD (hyperlipidemia)     Past Surgical History:  Procedure Laterality Date  . CARDIOVERSION N/A 12/25/2015   Procedure: CARDIOVERSION;  Surgeon: Dolores Patty, MD;  Location: [facility] ENDOSCOPY;  Service: Cardiovascular;  Laterality: N/A;  .  CARDIOVERSION N/A 01/04/2018   Procedure: CARDIOVERSION;  Surgeon: Dolores Patty, MD;  Location: [facility] ENDOSCOPY;  Service: Cardiovascular;  Laterality: N/A;  . HERNIA REPAIR    . REPLACEMENT TOTAL KNEE    . TONSILLECTOMY       OB History   None      Home Medications    Prior to Admission medications   Medication Sig Start Date End Date Taking? Authorizing Provider  acetaminophen (TYLENOL) 650 MG CR tablet Take 1,300 mg by mouth every 8 (eight) hours as needed for pain.    [provider]  amLODipine (NORVASC) 5 MG tablet TAKE 1 TABLET BY MOUTH EVERY DAY 10/28/17   Bensimhon, Bevelyn Buckles, MD  aspirin 81 MG chewable tablet Chew 81 mg by mouth daily.    [provider]  Bioflavonoid Products (VITAMIN C/BIOFLAVONOIDS) 1000-25 MG TABS Take 1 tablet by mouth daily. Reported on 12/24/2015    [provider]  Calcium Carbonate-Vitamin D (CALCIUM-VITAMIN D) 500-200 MG-UNIT tablet Take 1 tablet by mouth daily.    [provider]  cholecalciferol (VITAMIN D) 1000 units tablet Take 2,000 Units by mouth daily.    [provider]  Cinnamon 500 MG capsule Take 500 mg by mouth daily.    [provider]  Coenzyme Q10 (COQ10) 100 MG CAPS Take 100 mg by mouth daily.     [provider]  DULoxetine (CYMBALTA) 30 MG capsule Take 30 mg by mouth daily.    [provider]  ELIQUIS 5 MG TABS tablet TAKE 1 TABLET BY MOUTH 2 TIMES A DAY 10/28/17   Bensimhon, Bevelyn Buckles, MD  ENTRESTO 97-103 MG TAKE 1 TABLET BY MOUTH 2 TIMES A DAY 01/05/18   Bensimhon, Bevelyn Buckles, MD  furosemide (LASIX) 40 MG tablet TAKE 1 TABLET BY MOUTH EVERY DAY 01/05/18   Bensimhon, Bevelyn Buckles, MD  gabapentin (NEURONTIN) 300 MG capsule Take 300 mg by mouth 2 (two) times daily as needed (joint pain).     [provider]  lactobacillus acidophilus & bulgar (LACTINEX) chewable tablet Chew 3 tablets by mouth at bedtime.     [provider]  LECITHIN PO Take 2 tablets by  mouth daily.     [provider]  Liniments [facility] ARTHRITIS PAIN RELIEF EX) Place 1 patch onto the skin daily as needed (pain).    [provider]  loratadine (CLARITIN) 10 MG tablet Take 10 mg by mouth daily as needed for allergies.    [provider]  Menthol, Topical Analgesic, (BIOFREEZE EX) Apply 1 application topically 2 (two) times daily as needed (pain).    [provider]  Multiple Vitamins-Minerals (MULTIVITAMIN WITH MINERALS) tablet Take 1 tablet by mouth daily.    [provider]  Omega-3 Fatty Acids (FISH OIL) 1000 MG CAPS Take 1,000 mg by mouth daily.     [provider]  Red Yeast Rice 600 MG CAPS Take 600 mg by mouth daily.     [provider]  spironolactone (ALDACTONE)  25 MG tablet TAKE 1 TABLET BY MOUTH EVERY DAY AT NOON 03/08/17   Bensimhon, Bevelyn Buckles, MD  vitamin E 400 UNIT capsule Take 400 Units by mouth daily.     [provider]    Family History Family History  Problem Relation Age of Onset  . Breast cancer Mother   . Hypertension Sister     Social History Social History   Tobacco Use  . Smoking status: Never Smoker  . Smokeless tobacco: Never Used  Substance Use Topics  . Alcohol use: No  . Drug use: No     Allergies   Patient has no known allergies.   Review of Systems Review of Systems  Constitutional: Negative for fever.  HENT: Negative for rhinorrhea and sore throat.   Eyes: Negative for redness.  Respiratory: Negative for cough.   Cardiovascular: Positive for leg swelling. Negative for chest pain.  Gastrointestinal: Negative for abdominal pain, diarrhea, nausea and vomiting.  Genitourinary: Negative for dysuria.  Musculoskeletal: Negative for myalgias.  Skin: Positive for wound. Negative for rash.  Neurological: Positive for speech difficulty and weakness. Negative for headaches.     Physical Exam Updated Vital Signs BP (!) 129/52 (BP Location: Right Arm)   Pulse  90   Temp 98 F (36.7 C) (Oral)   Resp 16   SpO2 98%   Physical Exam  Constitutional: She is oriented to person, place, and time. She appears well-developed and well-nourished.  HENT:  Head: Normocephalic and atraumatic.  Right Ear: Tympanic membrane, external ear and ear canal normal.  Left Ear: Tympanic membrane, external ear and ear canal normal.  Nose: Nose normal.  Mouth/Throat: Uvula is midline, oropharynx is clear and moist and mucous membranes are normal.  Eyes: Pupils are equal, round, and reactive to light. Conjunctivae, EOM and lids are normal. Right eye exhibits no discharge. Left eye exhibits no discharge. Right eye exhibits no nystagmus. Left eye exhibits no nystagmus.  Neck: Normal range of motion. Neck supple.  Cardiovascular: Normal rate and normal heart sounds.  Pulmonary/Chest: Effort normal and breath sounds normal. No stridor. She has no wheezes. She has no rales.  Abdominal: Soft. There is no tenderness. There is no rebound and no guarding.  Musculoskeletal: She exhibits tenderness. She exhibits no edema.       Cervical back: She exhibits normal range of motion, no tenderness and no bony tenderness.  Neurological: She is alert and oriented to person, place, and time. She has normal strength and normal reflexes. No cranial nerve deficit or sensory deficit. She displays a negative Romberg sign. Coordination and gait normal. GCS eye subscore is 4. GCS verbal subscore is 5. GCS motor subscore is 6.  Skin: Skin is warm and dry.  Patient with a shallow ulceration to the posterior left ankle.  Bandage was partially saturated with serous fluid.  No surrounding erythema.  No significant tenderness.  Psychiatric: She has a normal mood and affect.  Nursing note and vitals reviewed.    ED Treatments / Results  Labs (all labs ordered are listed, but only abnormal results are displayed) Labs Reviewed  PROTIME-INR - Abnormal; Notable for the following components:      Result  Value   Prothrombin Time 15.6 (*)    All other components within normal limits  CBC - Abnormal; Notable for the following components:   RBC 3.54 (*)    Hemoglobin 10.0 (*)    HCT 33.0 (*)    All other components within normal limits  COMPREHENSIVE METABOLIC PANEL - Abnormal; Notable for the following components:   Potassium 5.4 (*)    Chloride 112 (*)    CO2 19 (*)    Glucose, Bld 106 (*)    BUN 93 (*)    Creatinine, Ser 2.42 (*)    Calcium 8.7 (*)    Total Protein 6.4 (*)    Albumin 3.4 (*)    ALT 12 (*)    GFR calc non Af Amer 19 (*)    GFR calc Af Amer 22 (*)    All other components within normal limits  ETHANOL  APTT  DIFFERENTIAL  RAPID URINE DRUG SCREEN, HOSP PERFORMED  URINALYSIS, ROUTINE W REFLEX MICROSCOPIC  I-STAT TROPONIN, ED  I-STAT CG4 LACTIC ACID, ED  I-STAT CG4 LACTIC ACID, ED    EKG EKG Interpretation  Date/Time:  Tuesday April 12 2018 16:33:01 EDT Ventricular Rate:  98 PR Interval:    QRS Duration: 162 QT Interval:  417 QTC Calculation: 533 R Axis:   36 Text Interpretation:  Atrial fibrillation Left bundle branch block since last tracing no significant change Confirmed by Rolan Bucco [phone]) on 04/12/2018 4:55:55 PM    Radiology Dg Chest 2 View  Result Date: 04/12/2018 CLINICAL DATA:  Disorientation after taking medication. EXAM: CHEST - 2 VIEW COMPARISON:  04/11/2016 FINDINGS: Heart size upper limits of normal. There is mitral annular calcification. There is aortic atherosclerotic calcification. The lungs are clear. The vascularity is normal. No effusions. No acute bone finding. IMPRESSION: Borderline cardiomegaly. Aortic atherosclerosis. No active disease. Electronically Signed   By: Paulina Fusi M.D.   On: 04/12/2018 16:18   Ct Head Wo Contrast  Result Date: 04/12/2018 CLINICAL DATA:  C/o "feeling disoriented after taking bactrim". HX HTN, CHF, afib EXAM: CT HEAD WITHOUT CONTRAST TECHNIQUE: Contiguous axial images were obtained from the base  of the skull through the vertex without intravenous contrast. COMPARISON:  None. FINDINGS: Brain: No acute intracranial hemorrhage. No focal mass lesion. No CT evidence of acute infarction. No midline shift or mass effect. No hydrocephalus. Basilar cisterns are patent. Minimal atrophy and white matter microvascular disease. Vascular: No hyperdense vessel or unexpected calcification. Skull: Normal. Negative for fracture or focal lesion. Sinuses/Orbits: Paranasal sinuses and mastoid air cells are clear. Orbits are clear. Other: None. IMPRESSION: No acute intracranial findings. Minimal atrophy and white matter microvascular disease. Electronically Signed   By: Genevive Bi M.D.   On: 04/12/2018 16:25    Procedures Procedures (including critical care time)  Medications Ordered in ED Medications  sodium chloride 0.9 % bolus 500 mL (has no administration in time range)  sodium chloride 0.9 % bolus 500 mL (0 mLs Intravenous Stopped 04/12/18 1828)     Initial Impression / Assessment and Plan / ED Course  I have reviewed the triage vital signs and the nursing notes.  Pertinent labs & imaging results that were available during my care of the patient were reviewed by me and considered in my medical decision making (see chart for details).     Patient seen and examined. Work-up initiated. DDx includes infection, CVA/TIA, medication side-effect. Work-up commenced.    Vital signs reviewed and are as follows: BP (!) 129/52 (BP Location: Right Arm)   Pulse 90   Temp 98 F (36.7 C) (Oral)   Resp 16   SpO2 98%   4:43 PM Discussed with Dr. Fredderick Phenix.   6:26 PM AKI noted 1.11 last month --> 2.42. Will need admit for this. Initial CVA work-up negative. Will  defer further eval to admitting team. Pt updated. No neurological changes.   6:55 PM Spoke with hospitalist Dr. Arlean Hopping who will see patient.    Final Clinical Impressions(s) / ED Diagnoses   Final diagnoses:  Acute kidney injury (HCC)  TIA  (transient ischemic attack)   Admit for above.   ED Discharge Orders    None       Renne Crigler, PA-C 04/12/18 1856    Rolan Bucco, MD 04/12/18 2037

## 2018-04-12 NOTE — H&P (Addendum)
History and Physical    Kelly Dickson ZOX:[id number] DOB: 1946/06/11 DOA: 04/12/2018  PCP: Andreas Blower., MD Patient coming from: Home  I have personally briefly reviewed patient's old medical records in [facility]  Chief Complaint: Confusion  HPI: Kelly Dickson is a 72 y.o. female with medical history significant for NICM (last EF 35% in February 2019), paroxysmal A. fib on Eliquis, TIA in 2018, hypertension, hyperlipidemia and peripheral neuropathy who presented to the ED from home via EMS with her daughter, Kelly Dickson, with 1 week of progressing confusion, mild dysarthria, dysgraphia and weakness.  The patient was seen in urgent care clinic on 6/4 in [place] for a skin and soft tissue infection on her medial left lower extremity.  She was prescribed a course of Bactrim which she completed.  Since starting the Bactrim, the above symptoms began and worsened until today.  Along with the symptoms noted above, the patient also had nausea and retching without vomiting approximately 1 week ago that has since resolved.  She also notes a few days of diarrhea approximately 1 week ago that have also resolved.  She denies poor solid or liquid intake.  She has been taking all of her medications as prescribed.  She denies any urinary changes, however her daughter does note that her urine looked darker than normal today.  No fever, chills, myalgias, rash, cough, shortness of breath, chest pain, palpitations, abdominal pain, dysuria or hematuria.  Of note, her TIA symptoms in 2018 were dysarthria that resolved rapidly.  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  ED Course: In the ED, the patient is afebrile, mildly tachycardic, normotensive and saturating  comfortably on room air.  Labs are notable for K5.4, CO2 19, BUN 93, creatinine 2.42, anion gap 8, normal lactate, negative troponin.  Chest x-ray showed borderline cardiomegaly without acute cardiopulmonary abnormality.  CT head showed no acute intercranial abnormality on a background of chronic white matter disease.  EKG showed left bundle branch block and rate controlled A. fib.  Most recent TTE obtained in February 2019 showed an EF of 35%, LVH, mild to moderate aortic stenosis and mild mitral regurgitation with severe mitral annular calcification.  Patient received 1 L normal saline bolus while in the ED.  Review of Systems: As per HPI otherwise 10 point review of systems negative.    Past Medical History:  Diagnosis Date  . A-fib (HCC)   . CHF (congestive heart failure) (HCC)   . Essential hypertension   . HLD (hyperlipidemia)   . MR (mitral regurgitation)   . Panic attack   . Pulmonary hypertension (HCC)   . TR (tricuspid regurgitation)     Past Surgical History:  Procedure Laterality Date  . CARDIOVERSION N/A 12/25/2015   Procedure: CARDIOVERSION;  Surgeon: Dolores Patty, MD;  Location: [facility] ENDOSCOPY;  Service: Cardiovascular;  Laterality: N/A;  . CARDIOVERSION N/A 01/04/2018   Procedure: CARDIOVERSION;  Surgeon: Dolores Patty, MD;  Location: [facility] ENDOSCOPY;  Service: Cardiovascular;  Laterality: N/A;  . HERNIA REPAIR    . REPLACEMENT TOTAL KNEE    . TONSILLECTOMY       reports that she has never smoked. She has never used smokeless tobacco. She reports that she does not drink alcohol or use drugs.  No Known Allergies  Family History  Problem Relation Age of Onset  . Breast cancer Mother   . Hypertension Sister     Prior to Admission medications   Medication Sig Start Date End Date Taking? Authorizing Provider  acetaminophen (TYLENOL) 650 MG CR tablet Take 1,300 mg by mouth every 8 (eight) hours as needed for pain.    [provider]  amLODipine  (NORVASC) 5 MG tablet TAKE 1 TABLET BY MOUTH EVERY DAY 10/28/17   Bensimhon, Bevelyn Buckles, MD  aspirin 81 MG chewable tablet Chew 81 mg by mouth daily.    [provider]  Bioflavonoid Products (VITAMIN C/BIOFLAVONOIDS) 1000-25 MG TABS Take 1 tablet by mouth daily. Reported on 12/24/2015    [provider]  Calcium Carbonate-Vitamin D (CALCIUM-VITAMIN D) 500-200 MG-UNIT tablet Take 1 tablet by mouth daily.    [provider]  cholecalciferol (VITAMIN D) 1000 units tablet Take 2,000 Units by mouth daily.    [provider]  Cinnamon 500 MG capsule Take 500 mg by mouth daily.    [provider]  Coenzyme Q10 (COQ10) 100 MG CAPS Take 100 mg by mouth daily.     [provider]  DULoxetine (CYMBALTA) 30 MG capsule Take 30 mg by mouth daily.    [provider]  ELIQUIS 5 MG TABS tablet TAKE 1 TABLET BY MOUTH 2 TIMES A DAY 10/28/17   Bensimhon, Bevelyn Buckles, MD  ENTRESTO 97-103 MG TAKE 1 TABLET BY MOUTH 2 TIMES A DAY 01/05/18   Bensimhon, Bevelyn Buckles, MD  furosemide (LASIX) 40 MG tablet TAKE 1 TABLET BY MOUTH EVERY DAY 01/05/18   Bensimhon, Bevelyn Buckles, MD  gabapentin (NEURONTIN) 300 MG capsule Take 300 mg by mouth 2 (two) times daily as needed (joint pain).     [provider]  lactobacillus acidophilus & bulgar (LACTINEX) chewable tablet Chew 3 tablets by mouth at bedtime.     [provider]  LECITHIN PO Take 2 tablets by mouth daily.     [provider]  Liniments [facility] ARTHRITIS PAIN RELIEF EX) Place 1 patch onto the skin daily as needed (pain).    [provider]  loratadine (CLARITIN) 10 MG tablet Take 10 mg by mouth daily as needed for allergies.    [provider]  Menthol, Topical Analgesic, (BIOFREEZE EX) Apply 1 application topically 2 (two) times daily as needed (pain).    [provider]  Multiple Vitamins-Minerals (MULTIVITAMIN WITH MINERALS) tablet Take 1 tablet by mouth daily.    [provider]  Omega-3 Fatty Acids (FISH OIL) 1000 MG CAPS Take 1,000 mg by mouth daily.     [provider]  Red Yeast Rice 600 MG CAPS Take 600 mg by mouth daily.     [provider]  spironolactone (ALDACTONE) 25 MG tablet TAKE 1 TABLET BY MOUTH EVERY DAY AT NOON 03/08/17   Bensimhon, Bevelyn Buckles, MD  vitamin E 400 UNIT capsule Take 400 Units by mouth daily.     [provider]    Physical Exam: Vitals:   04/12/18 1845 04/12/18 1900 04/12/18 1915 04/12/18 1932  BP: 108/75 128/75 118/62 102/70  Pulse: 93 74 85 (!) 101  Resp: (!) 22 18 18 20   Temp:      TempSrc:      SpO2: 100% 98% 100% 98%    Constitutional: NAD, calm, comfortable Eyes: PERRL, lids and conjunctivae normal ENMT: Mucous membranes are dry. Posterior pharynx clear of any exudate or  lesions. Neck: normal, supple, no masses Respiratory: clear to auscultation bilaterally, no wheezing, no crackles. Normal respiratory effort. Cardiovascular: irregularly irregular, distant heart sounds, soft early systolic murmur. 1+ pitting edema of BLE at ankles. 2+ pedal pulses. Abdomen: no tenderness, (+) reducible ventral hernia. Bowel sounds positive. (+) asterixis Musculoskeletal: no clubbing / cyanosis. No joint deformity upper and lower extremities. Good ROM, no contractures. Normal muscle tone.  Skin: varicosities on BLE, small circular well-healing ulcer on medial LLE Neurologic: CN 2-12 grossly intact. No pronator drift. Sensation intact, DTR normal. Strength 5/5 in all 4.  Psychiatric: Normal judgment and insight. Alert and oriented x 3. Normal mood.    Labs on Admission: I have personally reviewed following labs and imaging studies  CBC: Recent Labs  Lab 04/12/18 1642  WBC 7.2  NEUTROABS 4.8  HGB 10.0*  HCT 33.0*  MCV 93.2  PLT 223   Basic Metabolic Panel: Recent Labs  Lab 04/12/18 1642  NA 139  K 5.4*  CL 112*  CO2 19*  GLUCOSE 106*  BUN 93*  CREATININE 2.42*  CALCIUM 8.7*    GFR: CrCl cannot be calculated (Unknown ideal weight.). Liver Function Tests: Recent Labs  Lab 04/12/18 1642  AST 16  ALT 12*  ALKPHOS 65  BILITOT 0.5  PROT 6.4*  ALBUMIN 3.4*   No results for input(s): LIPASE, AMYLASE in the last 168 hours. No results for input(s): AMMONIA in the last 168 hours. Coagulation Profile: Recent Labs  Lab 04/12/18 1642  INR 1.25   Cardiac Enzymes: No results for input(s): CKTOTAL, CKMB, CKMBINDEX, TROPONINI in the last 168 hours. BNP (last 3 results) No results for input(s): PROBNP in the last 8760 hours. HbA1C: No results for input(s): HGBA1C in the last 72 hours. CBG: No results for input(s): GLUCAP in the last 168 hours. Lipid Profile: No results for input(s): CHOL, HDL, LDLCALC, TRIG, CHOLHDL, LDLDIRECT in the last 72 hours. Thyroid Function Tests: No results for input(s): TSH, T4TOTAL, FREET4, T3FREE, THYROIDAB in the last 72 hours. Anemia Panel: No results for input(s): VITAMINB12, FOLATE, FERRITIN, TIBC, IRON, RETICCTPCT in the last 72 hours. Urine analysis:    Component Value Date/Time   COLORURINE YELLOW 07/13/2011 1220   APPEARANCEUR CLEAR 07/13/2011 1220   LABSPEC 1.011 07/13/2011 1220   PHURINE 5.0 07/13/2011 1220   GLUCOSEU NEGATIVE 07/13/2011 1220   HGBUR NEGATIVE 07/13/2011 1220   BILIRUBINUR NEGATIVE 07/13/2011 1220   KETONESUR NEGATIVE 07/13/2011 1220   PROTEINUR NEGATIVE 07/13/2011 1220   UROBILINOGEN 0.2 07/13/2011 1220   NITRITE NEGATIVE 07/13/2011 1220   LEUKOCYTESUR SMALL (A) 07/13/2011 1220    Radiological Exams on Admission: Dg Chest 2 View  Result Date: 04/12/2018 CLINICAL DATA:  Disorientation after taking medication. EXAM: CHEST - 2 VIEW COMPARISON:  04/11/2016 FINDINGS: Heart size upper limits of normal. There is mitral annular calcification. There is aortic atherosclerotic calcification. The lungs are clear. The vascularity is normal. No effusions. No acute bone finding. IMPRESSION: Borderline  cardiomegaly. Aortic atherosclerosis. No active disease. Electronically Signed   By: Paulina Fusi M.D.   On: 04/12/2018 16:18   Ct Head Wo Contrast  Result Date: 04/12/2018 CLINICAL DATA:  C/o "feeling disoriented after taking bactrim". HX HTN, CHF, afib EXAM: CT HEAD WITHOUT CONTRAST TECHNIQUE: Contiguous axial images were obtained from the base of the skull through the vertex without intravenous contrast. COMPARISON:  None. FINDINGS: Brain: No acute intracranial hemorrhage. No focal mass lesion. No CT evidence of acute infarction. No midline shift or mass effect. No  hydrocephalus. Basilar cisterns are patent. Minimal atrophy and white matter microvascular disease. Vascular: No hyperdense vessel or unexpected calcification. Skull: Normal. Negative for fracture or focal lesion. Sinuses/Orbits: Paranasal sinuses and mastoid air cells are clear. Orbits are clear. Other: None. IMPRESSION: No acute intracranial findings. Minimal atrophy and white matter microvascular disease. Electronically Signed   By: Genevive Bi M.D.   On: 04/12/2018 16:25    EKG: Independently reviewed. Rate 98. Afib. Chronic LBBB.  Assessment/Plan Active Problems:   AKI (acute kidney injury) (HCC)  Prerenal AKI 2/2 medication effect Toxic encephalopathy in setting of renal failure Pt on numerous nephrotoxic meds, then started on Bactrim, with likely decreased PO intake, nausea and diarrhea. BUN 93 on admission with creatinine 2.4 from a baseline near 1.0. Pt not uremic on admission. No acute dialysis need. - Stop Bactrim, wound healing nicely - Hold Entresto, Aldactone, Lasix - Dose adjust Eliquis, gabapentin - Cautious volume resuscitation - Obtain urine studies - Consider renal U/S if renal function not improving with hydration - Strict I/O, daily weights - Avoid nephrotoxins - Daily BMP, Mg, PO4  Concern for TIA Dysarthria, dysgraphia With known history of TIA and abnormal MRI brain in 2018, it is reasonable to  work up further for TIA. It is more likely that her symptoms are related to azotemia. - Continue ASA, Eliquis - Continue Crestor - Monitor on telemetry - Obtain MR brain - Obtain TTE - Obtain CUS/TCD - Risk stratification labs - Neuro checks  Chronic systolic heart failure, not in exacerbation Paroxysmal atrial fibrillation - Gentle hydration as above, closely monitor volume status - Continue Eliquis as above - Not currently on rate control, goal HR <110 - Holding Entresto, Aldactone, Lasix in setting of AKI - Repeat TTE as above - Monitor on telemetry as above  SSTI of distal left lower extremity, subsequent encounter - Well healing, no further Abx - Wound care per Nursing  Chronic medical conditions - Idiopathic peripheral neuropathy: continue gabapentin (dose-adjusted), duloxetine - HTN: continue amlodipine  DVT prophylaxis: Eliquis Code Status: Full Family Communication: Daughter, Kelly Dickson [phone] Disposition Plan: Home in 2-3 days Consults called: None Admission status: Inpatient, Telemetry   Majed Pellegrin Laney Pastor MD [facility]  If 7PM-7AM, please contact night-coverage [email or web address] Password [email or web address]  04/12/2018, 8:00 PM

## 2018-04-12 NOTE — Telephone Encounter (Signed)
Patient's daughter called in stating her mother is enroute to the ER via EMS due to multiple symptoms and just not doing well.

## 2018-04-12 NOTE — Telephone Encounter (Signed)
Called pt to rechedule echo and f/u w/ db, db will not be in off 04/19/18 pt has not responded , canceled appt until pt calls back

## 2018-04-12 NOTE — ED Triage Notes (Signed)
Pt arrives from home with PTAR with a compliant of "feeling disoriented after taking bactrim".  Pt states this has happened before with taking this medication. Pt reports starting this medication on 6/2 after being diagnosed with a staph infection on left lower leg. Pt is alert and ox4, appears in no acute distress.

## 2018-04-13 ENCOUNTER — Inpatient Hospital Stay (HOSPITAL_COMMUNITY): Payer: Medicare Other

## 2018-04-13 ENCOUNTER — Other Ambulatory Visit: Payer: Self-pay

## 2018-04-13 ENCOUNTER — Encounter (HOSPITAL_COMMUNITY): Payer: Self-pay

## 2018-04-13 DIAGNOSIS — N179 Acute kidney failure, unspecified: Principal | ICD-10-CM

## 2018-04-13 DIAGNOSIS — I480 Paroxysmal atrial fibrillation: Secondary | ICD-10-CM

## 2018-04-13 DIAGNOSIS — E785 Hyperlipidemia, unspecified: Secondary | ICD-10-CM

## 2018-04-13 DIAGNOSIS — G9341 Metabolic encephalopathy: Secondary | ICD-10-CM

## 2018-04-13 DIAGNOSIS — E875 Hyperkalemia: Secondary | ICD-10-CM

## 2018-04-13 DIAGNOSIS — L03116 Cellulitis of left lower limb: Secondary | ICD-10-CM

## 2018-04-13 DIAGNOSIS — I5022 Chronic systolic (congestive) heart failure: Secondary | ICD-10-CM

## 2018-04-13 DIAGNOSIS — G459 Transient cerebral ischemic attack, unspecified: Secondary | ICD-10-CM

## 2018-04-13 DIAGNOSIS — I10 Essential (primary) hypertension: Secondary | ICD-10-CM

## 2018-04-13 DIAGNOSIS — I481 Persistent atrial fibrillation: Secondary | ICD-10-CM

## 2018-04-13 DIAGNOSIS — I272 Pulmonary hypertension, unspecified: Secondary | ICD-10-CM

## 2018-04-13 LAB — CBC
HCT: 30.5 % — ABNORMAL LOW (ref 36.0–46.0)
Hemoglobin: 9.4 g/dL — ABNORMAL LOW (ref 12.0–15.0)
MCH: 28.5 pg (ref 26.0–34.0)
MCHC: 30.8 g/dL (ref 30.0–36.0)
MCV: 92.4 fL (ref 78.0–100.0)
PLATELETS: 208 10*3/uL (ref 150–400)
RBC: 3.3 MIL/uL — AB (ref 3.87–5.11)
RDW: 15.3 % (ref 11.5–15.5)
WBC: 5.2 10*3/uL (ref 4.0–10.5)

## 2018-04-13 LAB — PROTEIN / CREATININE RATIO, URINE
CREATININE, URINE: 51.71 mg/dL
PROTEIN CREATININE RATIO: 0.29 mg/mg{creat} — AB (ref 0.00–0.15)
Total Protein, Urine: 15 mg/dL

## 2018-04-13 LAB — BASIC METABOLIC PANEL
Anion gap: 4 — ABNORMAL LOW (ref 5–15)
BUN: 68 mg/dL — ABNORMAL HIGH (ref 6–20)
CALCIUM: 8.3 mg/dL — AB (ref 8.9–10.3)
CHLORIDE: 116 mmol/L — AB (ref 101–111)
CO2: 21 mmol/L — AB (ref 22–32)
CREATININE: 1.58 mg/dL — AB (ref 0.44–1.00)
GFR calc Af Amer: 37 mL/min — ABNORMAL LOW (ref >60)
GFR calc non Af Amer: 32 mL/min — ABNORMAL LOW (ref >60)
GLUCOSE: 100 mg/dL — AB (ref 65–99)
Potassium: 5.4 mmol/L — ABNORMAL HIGH (ref 3.5–5.1)
Sodium: 141 mmol/L (ref 135–145)

## 2018-04-13 LAB — ECHOCARDIOGRAM COMPLETE
HEIGHTINCHES: 64 in
WEIGHTICAEL: 3721.36 [oz_av]

## 2018-04-13 LAB — NA AND K (SODIUM & POTASSIUM), RAND UR
POTASSIUM UR: 15 mmol/L
Sodium, Ur: 76 mmol/L

## 2018-04-13 LAB — PHOSPHORUS: Phosphorus: 3.9 mg/dL (ref 2.5–4.6)

## 2018-04-13 LAB — POTASSIUM: Potassium: 5.6 mmol/L — ABNORMAL HIGH (ref 3.5–5.1)

## 2018-04-13 LAB — GLUCOSE, CAPILLARY: GLUCOSE-CAPILLARY: 81 mg/dL (ref 65–99)

## 2018-04-13 LAB — MAGNESIUM: MAGNESIUM: 2.7 mg/dL — AB (ref 1.7–2.4)

## 2018-04-13 LAB — LACTIC ACID, PLASMA: Lactic Acid, Venous: 0.5 mmol/L (ref 0.5–1.9)

## 2018-04-13 MED ORDER — GABAPENTIN 100 MG PO CAPS
100.0000 mg | ORAL_CAPSULE | Freq: Two times a day (BID) | ORAL | Status: DC | PRN
  Administered 2018-04-14: 100 mg via ORAL
  Filled 2018-04-13: qty 1

## 2018-04-13 MED ORDER — SODIUM POLYSTYRENE SULFONATE 15 GM/60ML PO SUSP
45.0000 g | Freq: Once | ORAL | Status: AC
  Administered 2018-04-13: 45 g via ORAL
  Filled 2018-04-13: qty 180

## 2018-04-13 MED ORDER — SODIUM CHLORIDE 0.9 % IV SOLN
INTRAVENOUS | Status: DC
  Administered 2018-04-13 – 2018-04-14 (×2): via INTRAVENOUS

## 2018-04-13 MED ORDER — APIXABAN 5 MG PO TABS
5.0000 mg | ORAL_TABLET | Freq: Two times a day (BID) | ORAL | Status: DC
  Administered 2018-04-13 – 2018-04-14 (×3): 5 mg via ORAL
  Filled 2018-04-13 (×3): qty 1

## 2018-04-13 NOTE — Progress Notes (Signed)
Carotid artery duplex has been completed. 1-39% ICA stenosis bilaterally.   Transcranial Doppler  Date POD PCO2 HCT BP  MCA ACA PCA OPHT SIPH VERT Basilar  6/12 GC     Right  Left   *  *   *  *   *  *   22  26   14  19    -39  *   *           Right  Left                                            Right  Left                                             Right  Left                                             Right  Left                                            Right  Left                                            Right  Left                                        MCA = Middle Cerebral Artery      OPHT = Opthalmic Artery     BASILAR = Basilar Artery   ACA = Anterior Cerebral Artery     SIPH = Carotid Siphon PCA = Posterior Cerebral Artery   VERT = Verterbral Artery                   Normal MCA = 62+\-12 ACA = 50+\-12 PCA = 42+\-23   * Unable to visualize   04/13/18 3:49 PM Olen Cordial RVT

## 2018-04-13 NOTE — Progress Notes (Signed)
PT Cancellation Note  Patient Details Name: Kelly Dickson MRN: [id number] DOB: Dec 05, 1945   Cancelled Treatment:    Reason Eval/Treat Not Completed: Patient at procedure or test/unavailable, off the floor at this time   Fabio Asa 04/13/2018, 3:02 PM

## 2018-04-13 NOTE — Discharge Instructions (Signed)

## 2018-04-13 NOTE — Progress Notes (Signed)
  Echocardiogram 2D Echocardiogram has been performed.  Delcie Roch 04/13/2018, 2:55 PM

## 2018-04-13 NOTE — Progress Notes (Addendum)
OT Cancellation Note  Patient Details Name: Kelly Dickson MRN: [id number] DOB: Mar 31, 1946   Cancelled Treatment:    Reason Eval/Treat Not Completed: Patient at procedure or test/ unavailable  Emelda Fear 04/13/2018, 9:47 AM   Attempted again around 3:00pm. Pt off the floor for another procedure. OT will attempt again tomorrow for eval  Sherryl Manges OTR/L [phone]

## 2018-04-13 NOTE — Progress Notes (Signed)
SLP Cancellation Note  Patient Details Name: Kelly Dickson MRN: [id number] DOB: 1946/02/20   Cancelled treatment:       Reason Eval/Treat Not Completed: Patient at procedure or test/unavailable. Discussed baseline with daughter. Will plan to f/u.    Rosaline Ezekiel, Riley Nearing 04/13/2018, 3:11 PM

## 2018-04-13 NOTE — Progress Notes (Addendum)
PROGRESS NOTE    Kelly Dickson  JJH:[id number] DOB: 07/22/1946 DOA: 04/12/2018 PCP: Andreas Blower., MD    Brief Narrative:  Patient is a 73 year old female history of nonischemic cardiomyopathy EF 35% February 2019, paroxysmal atrial fibrillation on Eliquis, TIA in 2018, hypertension, hyperlipidemia, peripheral neuropathy presented to the ED with progressive confusion, mild dysarthria, dysgraphia and weakness.  Patient was seen at urgent care clinic on April 05, 2018 in [place] for skin and soft tissue infection of her medial left lower extremity and started on Bactrim at that time. Vision noted on admission to have borderline blood pressure, potassium of 5.4, creatinine of 2.42 with a BUN of 93.  Chest x-ray negative.  CT head negative.  EKG with controlled A. fib and left bundle branch block.  Patient placed on IV fluids admitted and stroke work-up also underway.   Assessment & Plan:   Principal Problem:   AKI (acute kidney injury) (HCC) Active Problems:   Acute metabolic encephalopathy   Cellulitis of leg, left   Essential hypertension   HLD (hyperlipidemia)   PAF (paroxysmal atrial fibrillation) (HCC)   Acute kidney injury (HCC)   Hyperkalemia   Chronic systolic CHF (congestive heart failure) (HCC)  #1 acute renal failure/acute kidney injury Likely secondary to medication induced likely secondary to Bactrim in the setting of poor oral intake and with underlying Aldactone, Lasix and Entresto.  Noted to have blood pressure of 85/58 with a heart rate in the 120s on admission.  Aldactone, Lasix, Entresto on hold.  Analysis negative for protein, negative for leukocytes or nitrites.  Put of 1.050 L over the past 24 hours.  Renal function trending down creatinine currently at 1.58.  Placed on gentle hydration normal saline 75 cc/h for the next 24 to 48 hours.  Monitor closely for volume overload.  Follow.  2.  Recent Left lower extremity cellulitis Left lower extremity with no erythema,  no warmth.  Healed well.  Patient has more than likely had a full course of antibiotic treatment.  No need for antibiotics at this time.  Follow.  3.  Acute metabolic encephalopathy/dysarthria/dysgraphia Patient with known history of TIA and abnormal MRI 2018.  Concern for CVA versus TIA.  Patient improved clinically.  CT head negative for any acute abnormalities.  MRI brain any acute intracranial abnormality.  Chronic ischemic changes including multiple old small left frontoparietal cortical infarcts as well as lacunar infarcts in the left basal ganglia and left thalamus.  Abnormal distal left ICA and proximal left MCA which may reflect chronic occlusion or severe narrowing.  Patient improving clinically.  2D echo pending.  Carotid Dopplers pending.  Regulation with Eliquis and aspirin.  As patient continues to improve likely no further work-up needed during this hospitalization.  Outpatient follow-up.  4.  Hyperlipidemia Continue statin.  Outpatient follow-up.  5.  Hyperkalemia Repeat potassium level.  If still elevated will give a dose of Kayexalate.  6.  Chronic systolic heart failure/paroxysmal atrial fibrillation Heart failure currently stable.  Patient noted to be hypotensive on admission and in acute renal failure and as such patient's Entresto, Aldactone, Lasix on hold.  Repeat 2D echo pending.  Rate Controlled.  Continue Eliquis for anticoagulation.  Will need outpatient follow-up with cardiology to determine when to resume cardiac medications.  7.  Peripheral neuropathy Continue gabapentin.  8.  Hypertension Continue Norvasc.      DVT prophylaxis: Eliquis Code Status: Full Family Communication: Updated patient and daughter at bedside. Disposition Plan: Pending PT evaluation  and work-up.   Consultants:   None  Procedures:   CT head 04/12/2018  CXR 04/12/2018  MRI head 04/13/2018  2 d echo pending 04/13/2018  Carotid ultrasound pending 04/13/2018  Transcranial  Dopplers pending 04/13/2018  Antimicrobials:   None   Subjective: Patient sitting up in bed alert and oriented to self place and time.  Slurred speech improved and seems to have resolved.  No chest pain.  No shortness of breath..  Objective: Vitals:   04/13/18 0300 04/13/18 0835 04/13/18 1153 04/13/18 1607  BP: (!) 100/50 (!) 106/50 (!) 100/46 115/76  Pulse: 70 83 79 96  Resp: 20 20 18 18   Temp: 98 F (36.7 C) (!) 97.5 F (36.4 C) 98.3 F (36.8 C) 98.6 F (37 C)  TempSrc: Oral Oral Oral Oral  SpO2: 100% 100% 99% 97%  Weight: 105.5 kg (232 lb 9.4 oz)     Height:        Intake/Output Summary (Last 24 hours) at 04/13/2018 1801 Last data filed at 04/13/2018 1700 Gross per 24 hour  Intake 2121.25 ml  Output 1800 ml  Net 321.25 ml   Filed Weights   04/12/18 2103 04/13/18 0300  Weight: 104.6 kg (230 lb 9.6 oz) 105.5 kg (232 lb 9.4 oz)    Examination:  General exam: Appears calm and comfortable  Respiratory system: Clear to auscultation. Respiratory effort normal. Cardiovascular system: S1 & S2 heard, RRR. No JVD, murmurs, rubs, gallops or clicks. No pedal edema. Gastrointestinal system: Abdomen is nondistended, soft and nontender. No organomegaly or masses felt. Normal bowel sounds heard. Central nervous system: Alert and oriented. No focal neurological deficits. Extremities: Symmetric 5 x 5 power. Skin: Chronic venous stasis changes noted bilaterally.  Small circular well-healing ulcer on medial left lower extremity.  No erythema, no warmth.  No rashes, lesions or ulcers Psychiatry: Judgement and insight appear normal. Mood & affect appropriate.     Data Reviewed: I have personally reviewed following labs and imaging studies  CBC: Recent Labs  Lab 04/12/18 1642 04/13/18 0449  WBC 7.2 5.2  NEUTROABS 4.8  --   HGB 10.0* 9.4*  HCT 33.0* 30.5*  MCV 93.2 92.4  PLT 223 208   Basic Metabolic Panel: Recent Labs  Lab 04/12/18 1642 04/12/18 2117 04/13/18 0449  04/13/18 1149  NA 139  --  141  --   K 5.4*  --  5.4* 5.6*  CL 112*  --  116*  --   CO2 19*  --  21*  --   GLUCOSE 106*  --  100*  --   BUN 93*  --  68*  --   CREATININE 2.42*  --  1.58*  --   CALCIUM 8.7*  --  8.3*  --   MG  --  2.6* 2.7*  --   PHOS  --  4.0 3.9  --    GFR: Estimated Creatinine Clearance: 38.1 mL/min (A) (by C-G formula based on SCr of 1.58 mg/dL (H)). Liver Function Tests: Recent Labs  Lab 04/12/18 1642  AST 16  ALT 12*  ALKPHOS 65  BILITOT 0.5  PROT 6.4*  ALBUMIN 3.4*   No results for input(s): LIPASE, AMYLASE in the last 168 hours. Recent Labs  Lab 04/12/18 2117  AMMONIA 13   Coagulation Profile: Recent Labs  Lab 04/12/18 1642  INR 1.25   Cardiac Enzymes: No results for input(s): CKTOTAL, CKMB, CKMBINDEX, TROPONINI in the last 168 hours. BNP (last 3 results) No results for input(s): PROBNP in  the last 8760 hours. HbA1C: Recent Labs    04/12/18 2117  HGBA1C 6.1*   CBG: Recent Labs  Lab 04/13/18 0607  GLUCAP 81   Lipid Profile: No results for input(s): CHOL, HDL, LDLCALC, TRIG, CHOLHDL, LDLDIRECT in the last 72 hours. Thyroid Function Tests: Recent Labs    04/12/18 2117  TSH 2.651   Anemia Panel: Recent Labs    04/12/18 2117  VITAMINB12 235   Sepsis Labs: Recent Labs  Lab 04/12/18 1724 04/13/18 0449  LATICACIDVEN 0.61 0.5    No results found for this or any previous visit (from the past 240 hour(s)).       Radiology Studies: Dg Chest 2 View  Result Date: 04/12/2018 CLINICAL DATA:  Disorientation after taking medication. EXAM: CHEST - 2 VIEW COMPARISON:  04/11/2016 FINDINGS: Heart size upper limits of normal. There is mitral annular calcification. There is aortic atherosclerotic calcification. The lungs are clear. The vascularity is normal. No effusions. No acute bone finding. IMPRESSION: Borderline cardiomegaly. Aortic atherosclerosis. No active disease. Electronically Signed   By: Paulina Fusi M.D.   On:  04/12/2018 16:18   Ct Head Wo Contrast  Result Date: 04/12/2018 CLINICAL DATA:  C/o "feeling disoriented after taking bactrim". HX HTN, CHF, afib EXAM: CT HEAD WITHOUT CONTRAST TECHNIQUE: Contiguous axial images were obtained from the base of the skull through the vertex without intravenous contrast. COMPARISON:  None. FINDINGS: Brain: No acute intracranial hemorrhage. No focal mass lesion. No CT evidence of acute infarction. No midline shift or mass effect. No hydrocephalus. Basilar cisterns are patent. Minimal atrophy and white matter microvascular disease. Vascular: No hyperdense vessel or unexpected calcification. Skull: Normal. Negative for fracture or focal lesion. Sinuses/Orbits: Paranasal sinuses and mastoid air cells are clear. Orbits are clear. Other: None. IMPRESSION: No acute intracranial findings. Minimal atrophy and white matter microvascular disease. Electronically Signed   By: Genevive Bi M.D.   On: 04/12/2018 16:25   Mr Brain Wo Contrast  Result Date: 04/13/2018 CLINICAL DATA:  Slurred speech, word-finding difficulty, and right hand weakness. Improving symptoms. EXAM: MRI HEAD WITHOUT CONTRAST TECHNIQUE: Multiplanar, multiecho pulse sequences of the brain and surrounding structures were obtained without intravenous contrast. COMPARISON:  Head CT 04/12/2018 FINDINGS: Brain: There is no evidence of acute infarct, mass, midline shift, or extra-axial fluid collection. Multiple chronic lacunar infarcts are present in the left basal ganglia with some associated hemosiderin deposition. There is mild ex vacuo dilatation of the left lateral ventricle. A chronic left thalamic lacunar infarct is also noted. There is prominent asymmetric volume loss involving the left parietal and, to a lesser extent, left frontal lobes with underlying small chronic cortical infarcts in these regions. Small foci of T2 hyperintensity scattered elsewhere in the cerebral white matter bilaterally and pons are  nonspecific but compatible with mild chronic small vessel ischemic disease. A mildly expanded partially empty sella is noted. Vascular: Abnormal, severely attenuated appearance of the distal left ICA, particularly in the region of the terminus. Circumferential intermediate FLAIR signal intensity surrounding the left ICA flow void at the skull base. Severely attenuated appearance or possible occlusion of the proximal left MCA with evidence of pial collaterals. Skull and upper cervical spine: Unremarkable bone marrow signal. Sinuses/Orbits: Mildly to moderately prominent CSF in both optic nerve sheaths. Paranasal sinuses and mastoid air cells are clear. Other: None. IMPRESSION: 1. No acute intracranial abnormality. 2. Chronic ischemic changes including multiple old small left frontoparietal cortical infarcts as well as lacunar infarcts in the left basal ganglia and  left thalamus. 3. Abnormal appearance of the distal left ICA and proximal left MCA which may reflect chronic occlusion or severe narrowing, such as from a remote dissection. Head and neck CTA could be performed for further evaluation if clinically warranted. 4. Partially empty sella, typically an incidental finding though intracranial hypertension is a consideration in the appropriate clinical setting given dilated optic nerve sheaths. Electronically Signed   By: Sebastian Ache M.D.   On: 04/13/2018 10:11        Scheduled Meds: . apixaban  5 mg Oral BID  . aspirin  81 mg Oral Daily  . calcium-vitamin D  1 tablet Oral Daily  . docusate sodium  100 mg Oral BID  . DULoxetine  30 mg Oral Daily  . gabapentin  100 mg Oral QHS  . multivitamin with minerals  1 tablet Oral Daily  . omega-3 acid ethyl esters  1 g Oral Daily  . rosuvastatin  40 mg Oral q1800  . senna  1 tablet Oral BID  . sodium chloride flush  3 mL Intravenous Q12H  . sodium polystyrene  45 g Oral Once   Continuous Infusions: . sodium chloride 75 mL/hr at 04/13/18 1200     LOS:  1 day    Time spent: 71 MINS    Ramiro Harvest, MD [facility] Pager [phone]  If 7PM-7AM, please contact night-coverage [email or web address] Password [email or web address] 04/13/2018, 6:01 PM

## 2018-04-13 NOTE — Progress Notes (Signed)
ANTICOAGULATION CONSULT NOTE - Initial Consult  Pharmacy Consult for apixaban Indication: atrial fibrillation  No Known Allergies  Patient Measurements: Height: 5\' 4"  (162.6 cm) Weight: 232 lb 9.4 oz (105.5 kg) IBW/kg (Calculated) : 54.7   Vital Signs: Temp: 98.3 F (36.8 C) (06/12 1153) Temp Source: Oral (06/12 1153) BP: 100/46 (06/12 1153) Pulse Rate: 79 (06/12 1153)  Labs: Recent Labs    04/12/18 1642 04/13/18 0449  HGB 10.0* 9.4*  HCT 33.0* 30.5*  PLT 223 208  APTT 29  --   LABPROT 15.6*  --   INR 1.25  --   CREATININE 2.42* 1.58*    Estimated Creatinine Clearance: 38.1 mL/min (A) (by C-G formula based on SCr of 1.58 mg/dL (H)).   Medical History: Past Medical History:  Diagnosis Date  . A-fib (HCC)   . CHF (congestive heart failure) (HCC)   . Essential hypertension   . HLD (hyperlipidemia)   . MR (mitral regurgitation)   . Panic attack   . Pulmonary hypertension (HCC)   . TR (tricuspid regurgitation)     Assessment: 72 yo female with afib on apixaban PTA. Patient admitted with AKI and apixaban dose decreased empirically per MD. Scr now decreased to 1.58 (BL ~1). MD has asked that pharmacy manage apixaban therapy inpatient.   Goal of Therapy:  Monitor platelets by anticoagulation protocol: Yes   Plan:  Increase apixaban to 5mg  bid per home dose with improving renal function Monitor for s/sx of bleeding, CBC   Natalyah Cummiskey A. Jeanella Craze, PharmD, BCPS Clinical Pharmacist [place] Pager: [phone]  04/13/2018,12:33 PM

## 2018-04-14 LAB — CBC
HCT: 31 % — ABNORMAL LOW (ref 36.0–46.0)
HEMOGLOBIN: 9.4 g/dL — AB (ref 12.0–15.0)
MCH: 28.2 pg (ref 26.0–34.0)
MCHC: 30.3 g/dL (ref 30.0–36.0)
MCV: 93.1 fL (ref 78.0–100.0)
Platelets: 198 10*3/uL (ref 150–400)
RBC: 3.33 MIL/uL — ABNORMAL LOW (ref 3.87–5.11)
RDW: 15.4 % (ref 11.5–15.5)
WBC: 5.6 10*3/uL (ref 4.0–10.5)

## 2018-04-14 LAB — CALCIUM / CREATININE RATIO, URINE
CALCIUM UR: 1.6 mg/dL
CALCIUM/CREAT. RATIO: 35 mg/g{creat} (ref 0–260)
CREATININE, UR: 45.9 mg/dL

## 2018-04-14 LAB — BASIC METABOLIC PANEL
ANION GAP: 6 (ref 5–15)
BUN: 40 mg/dL — ABNORMAL HIGH (ref 6–20)
CALCIUM: 8.3 mg/dL — AB (ref 8.9–10.3)
CO2: 23 mmol/L (ref 22–32)
Chloride: 114 mmol/L — ABNORMAL HIGH (ref 101–111)
Creatinine, Ser: 0.95 mg/dL (ref 0.44–1.00)
GFR calc non Af Amer: 58 mL/min — ABNORMAL LOW (ref >60)
Glucose, Bld: 115 mg/dL — ABNORMAL HIGH (ref 65–99)
Potassium: 4.4 mmol/L (ref 3.5–5.1)
Sodium: 143 mmol/L (ref 135–145)

## 2018-04-14 LAB — GLUCOSE, CAPILLARY: GLUCOSE-CAPILLARY: 99 mg/dL (ref 65–99)

## 2018-04-14 LAB — UREA NITROGEN, URINE: Urea Nitrogen, Ur: 673 mg/dL

## 2018-04-14 MED ORDER — GABAPENTIN 300 MG PO CAPS
300.0000 mg | ORAL_CAPSULE | Freq: Two times a day (BID) | ORAL | Status: DC
  Administered 2018-04-14 (×2): 300 mg via ORAL
  Filled 2018-04-14 (×2): qty 1

## 2018-04-14 MED ORDER — LOSARTAN POTASSIUM 50 MG PO TABS: 50.0000 mg | ORAL_TABLET | Freq: Every day | ORAL | 0 refills | Status: DC

## 2018-04-14 MED ORDER — FUROSEMIDE 40 MG PO TABS: 40.0000 mg | ORAL_TABLET | Freq: Every day | ORAL | 3 refills | Status: DC

## 2018-04-14 MED ORDER — SENNA 8.6 MG PO TABS: 1.0000 | ORAL_TABLET | Freq: Two times a day (BID) | ORAL | 0 refills | Status: DC

## 2018-04-14 MED ORDER — GABAPENTIN 300 MG PO CAPS: 300.0000 mg | ORAL_CAPSULE | Freq: Two times a day (BID) | ORAL | 0 refills | Status: AC

## 2018-04-14 MED ORDER — ROSUVASTATIN CALCIUM 40 MG PO TABS: 40.0000 mg | ORAL_TABLET | Freq: Every day | ORAL | 0 refills | Status: DC

## 2018-04-14 NOTE — NC FL2 (Signed)
[place] MEDICAID FL2 LEVEL OF CARE SCREENING TOOL     IDENTIFICATION  Patient Name: Kelly Dickson Birthdate: 12-13-1945 Sex: female Admission Date (Current Location): 04/12/2018  [facility] and [place] Number:  [employer] and Address:  [facility] [place], [address] [place], [place] [address]      Provider Number: [id number]  Attending Physician Name and Address:  Rodolph Bong, MD  Relative Name and Phone Number:       Current Level of Care: Hospital Recommended Level of Care: Skilled Nursing Facility Prior Approval Number:    Date Approved/Denied:   PASRR Number: [id number]  Discharge Plan: SNF    Current Diagnoses: Patient Active Problem List   Diagnosis Date Noted  . Acute metabolic encephalopathy 04/13/2018  . Acute kidney injury (HCC)   . Hyperkalemia   . Chronic systolic CHF (congestive heart failure) (HCC)   . AKI (acute kidney injury) (HCC) 04/12/2018  . TIA (transient ischemic attack) 04/27/2017  . Bradycardia 01/09/2016  . PAF (paroxysmal atrial fibrillation) (HCC)   . LBBB (left bundle branch block) 12/22/2015  . Leg swelling 11/25/2015  . Cellulitis of leg, left 11/25/2015  . Panic attack   . CHF (congestive heart failure) (HCC)   . Essential hypertension   . MR (mitral regurgitation)   . TR (tricuspid regurgitation)   . Pulmonary hypertension (HCC)   . HLD (hyperlipidemia)     Orientation RESPIRATION BLADDER Height & Weight     Self, Time, Situation, Place  Normal Continent Weight: 232 lb 9.4 oz (105.5 kg) Height:  5\' 4"  (162.6 cm)  BEHAVIORAL SYMPTOMS/MOOD NEUROLOGICAL BOWEL NUTRITION STATUS      Continent Diet(heart healthy)  AMBULATORY STATUS COMMUNICATION OF NEEDS Skin   Limited Assist Verbally Skin abrasions(left leg abrasion)                       Personal Care Assistance Level of Assistance  Bathing, Feeding, Dressing Bathing Assistance: Limited assistance Feeding assistance:  Independent Dressing Assistance: Limited assistance     Functional Limitations Info  Sight, Hearing, Speech Sight Info: Impaired Hearing Info: Adequate Speech Info: Adequate    SPECIAL CARE FACTORS FREQUENCY  Speech therapy     PT Frequency: 5x/wk OT Frequency: 5x/wk     Speech Therapy Frequency: 5x/wk      Contractures Contractures Info: Not present    Additional Factors Info  Code Status, Allergies Code Status Info: Full Allergies Info: NKA           Current Medications (04/14/2018):  This is the current hospital active medication list Current Facility-Administered Medications  Medication Dose Route Frequency Provider Last Rate Last Dose  . acetaminophen (TYLENOL) tablet 650 mg  650 mg Oral Q6H PRN Marcelo Baldy, MD   650 mg at 04/14/18 5621   Or  . acetaminophen (TYLENOL) suppository 650 mg  650 mg Rectal Q6H PRN Marcelo Baldy, MD      . apixaban Everlene Balls) tablet 5 mg  5 mg Oral BID Rodolph Bong, MD   5 mg at 04/14/18 0857  . aspirin chewable tablet 81 mg  81 mg Oral Daily Marcelo Baldy, MD   81 mg at 04/14/18 0858  . calcium-vitamin D (OSCAL WITH D) 500-200 MG-UNIT per tablet 1 tablet  1 tablet Oral Daily Marcelo Baldy, MD   1 tablet at 04/14/18 [phone]  . docusate sodium (COLACE) capsule 100 mg  100 mg Oral BID Koren Bound  Tenny Craw, MD   100 mg at 04/13/18 2131  . DULoxetine (CYMBALTA) DR capsule 30 mg  30 mg Oral Daily Marcelo Baldy, MD   30 mg at 04/14/18 0858  . gabapentin (NEURONTIN) capsule 300 mg  300 mg Oral BID Rodolph Bong, MD   300 mg at 04/14/18 1251  . multivitamin with minerals tablet 1 tablet  1 tablet Oral Daily Marcelo Baldy, MD   1 tablet at 04/14/18 [phone]  . omega-3 acid ethyl esters (LOVAZA) capsule 1 g  1 g Oral Daily Marcelo Baldy, MD   1 g at 04/14/18 0857  . ondansetron (ZOFRAN) tablet 4 mg  4 mg Oral Q6H PRN Marcelo Baldy, MD       Or  . ondansetron [facility]) injection 4 mg  4 mg Intravenous Q6H  PRN Marcelo Baldy, MD      . polyethylene glycol [facility] / GLYCOLAX) packet 17 g  17 g Oral Daily PRN Marcelo Baldy, MD      . rosuvastatin (CRESTOR) tablet 40 mg  40 mg Oral q1800 Marcelo Baldy, MD   40 mg at 04/13/18 1758  . senna (SENOKOT) tablet 8.6 mg  1 tablet Oral BID Marcelo Baldy, MD   8.6 mg at 04/13/18 2131  . sodium chloride flush (NS) 0.9 % injection 3 mL  3 mL Intravenous Q12H Marcelo Baldy, MD   3 mL at 04/14/18 1030     Discharge Medications: Please see discharge summary for a list of discharge medications.  Relevant Imaging Results:  Relevant Lab Results:   Additional Information SS#: [id number]  Baldemar Lenis, LCSW

## 2018-04-14 NOTE — Progress Notes (Signed)
Physical Therapy Evaluation Patient Details Name: Kelly Dickson MRN: [id number] DOB: 04/11/1946 Today's Date: 04/14/2018   History of Present Illness  Patient is a 72 y/o female presenting with confusion and weakness. CT head showing no acute intracranial abnormality. Admitted with AKI secondary to medication effect. Patient with a PMH significant for NICM (last EF 35% in February 2019), paroxysmal A. fib on Eliquis, TIA in 2018, hypertension, hyperlipidemia and peripheral neuropathy.  Clinical Impression  Kelly Dickson is a very pleasant 72 y/o female admitted with the above listed diagnosis. Prior to admission patient reports she lived alone and was Mod I with all aspects of mobility with use of SPC. Patient today confused at times, requiring Min A to Min guard for general safety with mobility. Heavy verbal cueing throughout session for safety with AD with limited carryover increasing her fall risk. Will recommend short term SNF stay at this time. PT to continue to follow acutely to maximize safety and functional mobility.     Follow Up Recommendations SNF;Supervision/Assistance - 24 hour    Equipment Recommendations  Other (comment)(will determine at next venue of care)    Recommendations for Other Services       Precautions / Restrictions Precautions Precautions: Fall Restrictions Weight Bearing Restrictions: No      Mobility  Bed Mobility Overal bed mobility: Needs Assistance Bed Mobility: Supine to Sit     Supine to sit: Supervision     General bed mobility comments: for safety and line management  Transfers Overall transfer level: Needs assistance   Transfers: Sit to/from Stand;Stand Pivot Transfers Sit to Stand: Min guard;Min assist Stand pivot transfers: Min guard;Min assist       General transfer comment: Assist for safety and sequencing/management of AD; poor safety awareness  Ambulation/Gait Ambulation/Gait assistance: Min guard Gait Distance (Feet): 120  Feet Assistive device: Rolling walker (2 wheeled) Gait Pattern/deviations: Step-through pattern;Decreased stride length;Narrow base of support Gait velocity: decreased   General Gait Details: poor safety awareness with AD - requires multiple verbal cues for RW management and appropriate use  Stairs            Wheelchair Mobility    Modified Rankin (Stroke Patients Only)       Balance Overall balance assessment: Needs assistance Sitting-balance support: Feet supported Sitting balance-Leahy Scale: Fair     Standing balance support: Bilateral upper extremity supported;During functional activity Standing balance-Leahy Scale: Fair                               Pertinent Vitals/Pain Pain Assessment: No/denies pain    Home Living Family/patient expects to be discharged to:: Skilled nursing facility Living Arrangements: Alone                    Prior Function Level of Independence: Independent with assistive device(s)         Comments: SPC     Hand Dominance        Extremity/Trunk Assessment        Lower Extremity Assessment Lower Extremity Assessment: Generalized weakness       Communication   Communication: No difficulties  Cognition Arousal/Alertness: Awake/alert Behavior During Therapy: WFL for tasks assessed/performed Overall Cognitive Status: Impaired/Different from baseline Area of Impairment: Orientation;Memory;Following commands;Safety/judgement;Problem solving                 Orientation Level: Disoriented to;Time   Memory: Decreased short-term memory Following Commands: Follows one step  commands consistently;Follows one step commands with increased time Safety/Judgement: Decreased awareness of safety;Decreased awareness of deficits   Problem Solving: Slow processing;Difficulty sequencing;Requires verbal cues;Requires tactile cues General Comments: tends to perseverate on certain things, becomes tearful when  speaking about memory      General Comments      Exercises     Assessment/Plan    PT Assessment Patient needs continued PT services  PT Problem List Decreased strength;Decreased activity tolerance;Decreased balance;Decreased mobility;Decreased coordination;Decreased knowledge of use of DME;Decreased safety awareness;Decreased knowledge of precautions       PT Treatment Interventions DME instruction;Gait training;Functional mobility training;Therapeutic activities;Therapeutic exercise;Balance training;Patient/family education    PT Goals (Current goals can be found in the Care Plan section)  Acute Rehab PT Goals Patient Stated Goal: improve mobility PT Goal Formulation: With patient Time For Goal Achievement: 04/28/18 Potential to Achieve Goals: Good    Frequency Min 2X/week   Barriers to discharge Decreased caregiver support patient lives alone with daughter unable to provide continuous care    Co-evaluation               AM-PAC PT "6 Clicks" Daily Activity  Outcome Measure Difficulty turning over in bed (including adjusting bedclothes, sheets and blankets)?: A Little Difficulty moving from lying on back to sitting on the side of the bed? : A Little Difficulty sitting down on and standing up from a chair with arms (e.g., wheelchair, bedside commode, etc,.)?: Unable Help needed moving to and from a bed to chair (including a wheelchair)?: A Little Help needed walking in hospital room?: A Little Help needed climbing 3-5 steps with a railing? : A Little 6 Click Score: 16    End of Session Equipment Utilized During Treatment: Gait belt Activity Tolerance: Patient tolerated treatment well Patient left: in chair;with call bell/phone within reach;with chair alarm set;with family/visitor present Nurse Communication: Mobility status PT Visit Diagnosis: Unsteadiness on feet (R26.81);Other abnormalities of gait and mobility (R26.89);Muscle weakness (generalized) (M62.81)     Time: 5027-7412 PT Time Calculation (min) (ACUTE ONLY): 34 min   Charges:   PT Evaluation $PT Eval Moderate Complexity: 1 Mod PT Treatments $Gait Training: 8-22 mins   PT G Codes:         Kelly Dickson, PT, DPT 04/14/18 12:00 PM

## 2018-04-14 NOTE — Discharge Summary (Signed)
Physician Discharge Summary  Kelly Dickson XLK:[id number] DOB: 01/03/1946 DOA: 04/12/2018  PCP: Andreas Blower., MD  Admit date: 04/12/2018 Discharge date: 04/14/2018  Time spent: 65 minutes  Recommendations for Outpatient Follow-up:  1. Patient will be discharged to skilled nursing facility and will follow up with MD at the skilled nursing facility. 2. Follow-up with cardiology, Dr. Nicholes Mango on 04/21/2018 at 10:30 AM.  On follow-up patient will need a basic metabolic profile done to follow-up on electrolytes and renal function.  Patient's chronic systolic heart failure will need to be followed up upon at that time. 3. Follow-up with Andreas Blower., MD in 3 to 4 weeks.  On follow-up patient will need a basic metabolic profile done to follow-up on electrolytes and renal function.  Patient's memory issues will need to be reassessed at that time to see if patient needs referral to neurology for evaluation for dementia.   Discharge Diagnoses:  Principal Problem:   AKI (acute kidney injury) (HCC) Active Problems:   Acute metabolic encephalopathy   Cellulitis of leg, left   Essential hypertension   HLD (hyperlipidemia)   PAF (paroxysmal atrial fibrillation) (HCC)   Acute kidney injury (HCC)   Hyperkalemia   Chronic systolic CHF (congestive heart failure) (HCC)   Discharge Condition: Stable and improved  Diet recommendation: Heart healthy  Filed Weights   04/12/18 2103 04/13/18 0300  Weight: 104.6 kg (230 lb 9.6 oz) 105.5 kg (232 lb 9.4 oz)    History of present illness:  Per Dr Claudette Stapler is a 72 y.o. female with medical history significant for NICM (last EF 35% in February 2019), paroxysmal A. fib on Eliquis, TIA in 2018, hypertension, hyperlipidemia and peripheral neuropathy who presented to the ED from home via EMS with her daughter, [name], with 1 week of progressing confusion, mild dysarthria, dysgraphia and weakness.  The patient was seen in urgent care  clinic on 6/4 in [place] for a skin and soft tissue infection on her medial left lower extremity.  She was prescribed a course of Bactrim which she completed.  Since starting the Bactrim, the above symptoms began and worsened until today.  Along with the symptoms noted above, the patient also had nausea and retching without vomiting approximately 1 week ago that has since resolved.  She also notes a few days of diarrhea approximately 1 week ago that have also resolved.  She denied poor solid or liquid intake.  She has been taking all of her medications as prescribed.  She denied any urinary changes, however her daughter did note that her urine looked darker than normal on day of admission.  No fever, chills, myalgias, rash, cough, shortness of breath, chest pain, palpitations, abdominal pain, dysuria or hematuria.  Of note, her TIA symptoms in 2018 were dysarthria that resolved rapidly.  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  ED Course: In the ED, the patient is afebrile, mildly tachycardic, normotensive and saturating comfortably on room air.  Labs are notable for K5.4, CO2 19, BUN 93, creatinine 2.42, anion gap 8, normal lactate, negative troponin.  Chest x-ray showed borderline cardiomegaly without acute cardiopulmonary abnormality.  CT head showed no acute intercranial abnormality on a background of chronic white matter disease.  EKG showed left bundle  branch block and rate controlled A. fib.  Most recent TTE obtained in February 2019 showed an EF of 35%, LVH, mild to moderate aortic stenosis and mild mitral regurgitation with severe mitral annular calcification.  Patient received 1 L normal saline bolus while in the ED.     Hospital Course:  1 acute renal  failure/acute kidney injury Likely secondary to medication induced likely secondary to Bactrim in the setting of poor oral intake and with underlying Aldactone, Lasix and Entresto.  Noted to have blood pressure of 85/58 with a heart rate in the 120s on admission.  Aldactone, Lasix, Entresto were held throughout the hospitalization.  Urinalysis was negative for protein, negative for leukocytes or nitrites.  Patient had good urine output.  Patient was hydrated gently with improvement with renal function such that by day of discharge creatinine was down to 0.98 from 2.42 on admission.  Patient had no signs of volume overload.  Patient will be resumed on Lasix and losartan via discussion with patient's cardiologist on 04/15/2018.  Patient will follow up with cardiology in 1 week and will have repeat basic metabolic profile done to follow-up on electrolytes and renal function.  2.  Recent Left lower extremity cellulitis Left lower extremity with no erythema, no warmth.  Healed well.  Patient has more than likely had a full course of antibiotic treatment.  Biotics were discontinued on discharge.  Patient improved clinically and cellulitis had resolved by day of discharge.  3.  Acute metabolic encephalopathy/dysarthria/dysgraphia Patient with known history of TIA and abnormal MRI 2018.  Concern for CVA versus TIA.    Were felt to be secondary to acute renal failure.  Patient improved clinically with gentle hydration and improvement with her renal function.  Due to patient's risk factors and comorbidities stroke work-up was undertaken. CT head negative for any acute abnormalities.  MRI brain any acute intracranial abnormality.  Chronic ischemic changes including multiple old small left frontoparietal cortical infarcts as well as lacunar infarcts in the left basal ganglia and left thalamus.  Abnormal distal left ICA and proximal left MCA which may reflect chronic occlusion or severe narrowing.    2D echo was obtained  which no source of emboli with a EF of 30 to 35% and a dilated left atrium.  Patient noted to be on anticoagulation prior to admission.  Carotid Dopplers which were done showed 1 to 39% right ICA and 1 to 39% left ICA stenosis which was nonsignificant.  Patient improved clinically and will follow-up in the outpatient setting.   4.  Hyperlipidemia Maintained on statin.  Outpatient follow-up.   5.  Hyperkalemia Patient noted to be hyperkalemic during the hospitalization felt likely secondary to acute renal failure.  Repeat potassium levels were obtained which was still elevated and patient given a dose of Kayexalate with resolution of hyperkalemia.   6.  Chronic systolic heart failure/paroxysmal atrial fibrillation Heart failure stable and compensated throughout the hospitalization.  Patient noted to be hypotensive on admission and in acute renal failure and as such patient's Entresto, Aldactone, Lasix were held.   Repeat 2D echo which was done had a EF of 30 to 35%, diffuse hypokinesis, septal motion showed abnormal function, dyssynergy and paradoxical consistent with intraventricular conduction delay.  Mitral valve was severely calcified, left atrium severely dilated.  Patient was hydrated gently with IV fluids and remained compensated throughout the hospitalization.  Patient was maintained on Eliquis for anticoagulation.  On discharge patient's Entresto and Aldactone will be discontinued.  Patient  will be resumed back on Lasix 40 mg daily and losartan 50 mg daily 04/15/2018.  Patient's cardiac medications were discussed with her cardiologist Dr. Gala Romney.  Patient will be seen on follow-up in 1 week whereby a repeat basic metabolic profile will be done to follow-up on electrolytes and renal function.   7.  Peripheral neuropathy Once patient renal function had improved patient was started back on home regimen of gabapentin.    8.  Hypertension Due to borderline blood pressure patient's  antihypertensive medications were held.  On discharge patient's Lasix will be resumed and patient will be started on losartan 50 mg daily.  Outpatient follow-up.     Procedures:  CT head 04/12/2018  CXR 04/12/2018  MRI head 04/13/2018  2 d echo 04/13/2018  Carotid ultrasound 04/13/2018  Transcranial Dopplers 04/13/2018      Consultations:  None  Discharge Exam: Vitals:   04/14/18 1225 04/14/18 1540  BP: 122/67 (!) 146/72  Pulse: 81 88  Resp: 18 18  Temp: 98 F (36.7 C) 98.5 F (36.9 C)  SpO2: 98% 98%    General: NAD Cardiovascular: RRR Respiratory: CTAB  Discharge Instructions   Discharge Instructions    Diet - low sodium heart healthy   Complete by:  As directed    Increase activity slowly   Complete by:  As directed      Allergies as of 04/14/2018   No Known Allergies     Medication List    STOP taking these medications   amLODipine 5 MG tablet Commonly known as:  NORVASC   ENTRESTO 97-103 MG Generic drug:  sacubitril-valsartan   spironolactone 25 MG tablet Commonly known as:  ALDACTONE     TAKE these medications   acetaminophen 650 MG CR tablet Commonly known as:  TYLENOL Take 1,300 mg by mouth daily.   aspirin 81 MG chewable tablet Chew 81 mg by mouth daily.   BIOFREEZE EX Apply 1 application topically 2 (two) times daily as needed (pain).   calcium-vitamin D 500-200 MG-UNIT tablet Take 1 tablet by mouth daily.   CoQ10 100 MG Caps Take 100 mg by mouth daily.   DULoxetine 30 MG capsule Commonly known as:  CYMBALTA Take 30 mg by mouth daily.   ELIQUIS 5 MG Tabs tablet Generic drug:  apixaban TAKE 1 TABLET BY MOUTH 2 TIMES A DAY   Fish Oil 1000 MG Caps Take 1,000 mg by mouth daily.   furosemide 40 MG tablet Commonly known as:  LASIX Take 1 tablet (40 mg total) by mouth daily. Start taking on:  04/15/2018   gabapentin 300 MG capsule Commonly known as:  NEURONTIN Take 1 capsule (300 mg total) by mouth 2 (two) times  daily. What changed:    when to take this  reasons to take this   lactobacillus acidophilus & bulgar chewable tablet Chew 3 tablets by mouth at bedtime.   LECITHIN PO Take 2 tablets by mouth at bedtime.   loratadine 10 MG tablet Commonly known as:  CLARITIN Take 10 mg by mouth at bedtime.   losartan 50 MG tablet Commonly known as:  COZAAR Take 1 tablet (50 mg total) by mouth daily. Start taking on:  04/15/2018   multivitamin with minerals tablet Take 1 tablet by mouth daily.   Red Yeast Rice 600 MG Caps Take 600 mg by mouth at bedtime.   rosuvastatin 40 MG tablet Commonly known as:  CRESTOR Take 1 tablet (40 mg total) by mouth daily at 6 PM.  SALONPAS ARTHRITIS PAIN RELIEF EX Place 1 patch onto the skin daily as needed (pain).   senna 8.6 MG Tabs tablet Commonly known as:  SENOKOT Take 1 tablet (8.6 mg total) by mouth 2 (two) times daily.   Vitamin C/Bioflavonoids 1000-25 MG Tabs Take 1 tablet by mouth at bedtime. Reported on 12/24/2015   vitamin E 400 UNIT capsule Take 400 Units by mouth daily.      No Known Allergies  Contact information for follow-up providers    [facility] Follow up on 04/21/2018.   Specialty:  Cardiology Why:  at [address] Contact information: [address] [id number] [address] [place] [place] [address] [phone]       MD AT SNF Follow up.        Andreas Blower., MD. Schedule an appointment as soon as possible for a visit in 3 week(s).   Specialty:  Internal Medicine Contact information: [address] [place] [address] [phone]            Contact information for after-discharge care    Destination    [facility] .   Service:  Skilled Nursing Contact information: [address] [place] [address] [phone]                   The results of significant diagnostics from this hospitalization  (including imaging, microbiology, ancillary and laboratory) are listed below for reference.    Significant Diagnostic Studies: Dg Chest 2 View  Result Date: 04/12/2018 CLINICAL DATA:  Disorientation after taking medication. EXAM: CHEST - 2 VIEW COMPARISON:  04/11/2016 FINDINGS: Heart size upper limits of normal. There is mitral annular calcification. There is aortic atherosclerotic calcification. The lungs are clear. The vascularity is normal. No effusions. No acute bone finding. IMPRESSION: Borderline cardiomegaly. Aortic atherosclerosis. No active disease. Electronically Signed   By: Paulina Fusi M.D.   On: 04/12/2018 16:18   Ct Head Wo Contrast  Result Date: 04/12/2018 CLINICAL DATA:  C/o "feeling disoriented after taking bactrim". HX HTN, CHF, afib EXAM: CT HEAD WITHOUT CONTRAST TECHNIQUE: Contiguous axial images were obtained from the base of the skull through the vertex without intravenous contrast. COMPARISON:  None. FINDINGS: Brain: No acute intracranial hemorrhage. No focal mass lesion. No CT evidence of acute infarction. No midline shift or mass effect. No hydrocephalus. Basilar cisterns are patent. Minimal atrophy and white matter microvascular disease. Vascular: No hyperdense vessel or unexpected calcification. Skull: Normal. Negative for fracture or focal lesion. Sinuses/Orbits: Paranasal sinuses and mastoid air cells are clear. Orbits are clear. Other: None. IMPRESSION: No acute intracranial findings. Minimal atrophy and white matter microvascular disease. Electronically Signed   By: Genevive Bi M.D.   On: 04/12/2018 16:25   Mr Brain Wo Contrast  Result Date: 04/13/2018 CLINICAL DATA:  Slurred speech, word-finding difficulty, and right hand weakness. Improving symptoms. EXAM: MRI HEAD WITHOUT CONTRAST TECHNIQUE: Multiplanar, multiecho pulse sequences of the brain and surrounding structures were obtained without intravenous contrast. COMPARISON:  Head CT 04/12/2018 FINDINGS: Brain:  There is no evidence of acute infarct, mass, midline shift, or extra-axial fluid collection. Multiple chronic lacunar infarcts are present in the left basal ganglia with some associated hemosiderin deposition. There is mild ex vacuo dilatation of the left lateral ventricle. A chronic left thalamic lacunar infarct is also noted. There is prominent asymmetric volume loss involving the left parietal and, to a lesser extent, left frontal lobes with underlying small  chronic cortical infarcts in these regions. Small foci of T2 hyperintensity scattered elsewhere in the cerebral white matter bilaterally and pons are nonspecific but compatible with mild chronic small vessel ischemic disease. A mildly expanded partially empty sella is noted. Vascular: Abnormal, severely attenuated appearance of the distal left ICA, particularly in the region of the terminus. Circumferential intermediate FLAIR signal intensity surrounding the left ICA flow void at the skull base. Severely attenuated appearance or possible occlusion of the proximal left MCA with evidence of pial collaterals. Skull and upper cervical spine: Unremarkable bone marrow signal. Sinuses/Orbits: Mildly to moderately prominent CSF in both optic nerve sheaths. Paranasal sinuses and mastoid air cells are clear. Other: None. IMPRESSION: 1. No acute intracranial abnormality. 2. Chronic ischemic changes including multiple old small left frontoparietal cortical infarcts as well as lacunar infarcts in the left basal ganglia and left thalamus. 3. Abnormal appearance of the distal left ICA and proximal left MCA which may reflect chronic occlusion or severe narrowing, such as from a remote dissection. Head and neck CTA could be performed for further evaluation if clinically warranted. 4. Partially empty sella, typically an incidental finding though intracranial hypertension is a consideration in the appropriate clinical setting given dilated optic nerve sheaths. Electronically  Signed   By: Sebastian Ache M.D.   On: 04/13/2018 10:11    Microbiology: No results found for this or any previous visit (from the past 240 hour(s)).   Labs: Basic Metabolic Panel: Recent Labs  Lab 04/12/18 1642 04/12/18 2117 04/13/18 0449 04/13/18 1149 04/14/18 0351  NA 139  --  141  --  143  K 5.4*  --  5.4* 5.6* 4.4  CL 112*  --  116*  --  114*  CO2 19*  --  21*  --  23  GLUCOSE 106*  --  100*  --  115*  BUN 93*  --  68*  --  40*  CREATININE 2.42*  --  1.58*  --  0.95  CALCIUM 8.7*  --  8.3*  --  8.3*  MG  --  2.6* 2.7*  --   --   PHOS  --  4.0 3.9  --   --    Liver Function Tests: Recent Labs  Lab 04/12/18 1642  AST 16  ALT 12*  ALKPHOS 65  BILITOT 0.5  PROT 6.4*  ALBUMIN 3.4*   No results for input(s): LIPASE, AMYLASE in the last 168 hours. Recent Labs  Lab 04/12/18 2117  AMMONIA 13   CBC: Recent Labs  Lab 04/12/18 1642 04/13/18 0449 04/14/18 0351  WBC 7.2 5.2 5.6  NEUTROABS 4.8  --   --   HGB 10.0* 9.4* 9.4*  HCT 33.0* 30.5* 31.0*  MCV 93.2 92.4 93.1  PLT 223 208 198   Cardiac Enzymes: No results for input(s): CKTOTAL, CKMB, CKMBINDEX, TROPONINI in the last 168 hours. BNP: BNP (last 3 results) Recent Labs    04/27/17 1505  BNP 94.4    ProBNP (last 3 results) No results for input(s): PROBNP in the last 8760 hours.  CBG: Recent Labs  Lab 04/13/18 0607 04/14/18 0631  GLUCAP 81 99       Signed:  Ramiro Harvest MD.  [facility] 04/14/2018, 3:49 PM

## 2018-04-14 NOTE — Progress Notes (Signed)
Occupational Therapy Evaluation Patient Details Name: Kelly Dickson MRN: [id number] DOB: 09-25-1946 Today's Date: 04/14/2018    Long note to follow, at this time, OT recommending SNF placement.   Evern Bio [name] 04/14/2018, 3:14 PM  Sherryl Manges OTR/L [phone]

## 2018-04-14 NOTE — Care Management Note (Signed)
Case Management Note  Patient Details  Name: Kelly Dickson MRN: [id number] Date of Birth: [birth year]  Subjective/Objective:                    Action/Plan: Pt discharging to [facility] today. CM signing off.   Expected Discharge Date:  04/14/18               Expected Discharge Plan:  Skilled Nursing Facility  In-House Referral:  Clinical Social Work  Discharge planning Services     Post Acute Care Choice:    Choice offered to:     DME Arranged:    DME Agency:     HH Arranged:    HH Agency:     Status of Service:  Completed, signed off  If discussed at [employer], dates discussed:    Additional Comments:  [name], RN 04/14/2018, 4:47 PM

## 2018-04-14 NOTE — Care Management Note (Signed)
Case Management Note  Patient Details  Name: Kelly Dickson MRN: [id number] Date of Birth: 24-Oct-1946  Subjective/Objective:     Pt admitted with AKI. Pt is from home alone. Daughter is supportive but unable to provide 24 hour care.                Action/Plan: Awaiting PT/OT evals. CM following for d/c disposition.   Expected Discharge Date:                  Expected Discharge Plan:     In-House Referral:     Discharge planning Services     Post Acute Care Choice:    Choice offered to:     DME Arranged:    DME Agency:     HH Arranged:    HH Agency:     Status of Service:  In process, will continue to follow  If discussed at Long Length of Stay Meetings, dates discussed:    Additional Comments:  [name], RN 04/14/2018, 11:18 AM

## 2018-04-14 NOTE — Clinical Social Work Placement (Signed)
Nurse to call report to [phone], Room 1204P     CLINICAL SOCIAL WORK PLACEMENT  NOTE  Date:  04/14/2018  Patient Details  Name: Kelly Dickson MRN: [id number] Date of Birth: 02-Nov-1946  Clinical Social Work is seeking post-discharge placement for this patient at the Skilled  Nursing Facility level of care (*CSW will initial, date and re-position this form in  chart as items are completed):  Yes   Patient/family provided with [facility] Clinical Social Work Department's list of facilities offering this level of care within the geographic area requested by the patient (or if unable, by the patient's family).  Yes   Patient/family informed of their freedom to choose among providers that offer the needed level of care, that participate in Medicare, Medicaid or managed care program needed by the patient, have an available bed and are willing to accept the patient.  Yes   Patient/family informed of [facility]'s ownership interest in [facility] and [facility], as well as of the fact that they are under no obligation to receive care at these facilities.  PASRR submitted to EDS on       PASRR number received on       Existing PASRR number confirmed on 04/14/18     FL2 transmitted to all facilities in geographic area requested by pt/family on 04/14/18     FL2 transmitted to all facilities within larger geographic area on       Patient informed that his/her managed care company has contracts with or will negotiate with certain facilities, including the following:        Yes   Patient/family informed of bed offers received.  Patient chooses bed at [facility]     Physician recommends and patient chooses bed at      Patient to be transferred to [facility] on 04/14/18.  Patient to be transferred to facility by [facility]     Patient family notified on 04/14/18 of transfer.  Name of family member notified:  [name]     PHYSICIAN       Additional Comment:     _______________________________________________ Baldemar Lenis, LCSW 04/14/2018, 4:03 PM

## 2018-04-14 NOTE — Evaluation (Signed)
Speech Language Pathology Evaluation Patient Details Name: Kelly Dickson MRN: [id number] DOB: 06-06-1946 Today's Date: 04/14/2018 Time: 4098-1191 SLP Time Calculation (min) (ACUTE ONLY): 20 min  Problem List:  Patient Active Problem List   Diagnosis Date Noted  . Acute metabolic encephalopathy 04/13/2018  . Acute kidney injury (HCC)   . Hyperkalemia   . Chronic systolic CHF (congestive heart failure) (HCC)   . AKI (acute kidney injury) (HCC) 04/12/2018  . TIA (transient ischemic attack) 04/27/2017  . Bradycardia 01/09/2016  . PAF (paroxysmal atrial fibrillation) (HCC)   . LBBB (left bundle branch block) 12/22/2015  . Leg swelling 11/25/2015  . Cellulitis of leg, left 11/25/2015  . Panic attack   . CHF (congestive heart failure) (HCC)   . Essential hypertension   . MR (mitral regurgitation)   . TR (tricuspid regurgitation)   . Pulmonary hypertension (HCC)   . HLD (hyperlipidemia)    Past Medical History:  Past Medical History:  Diagnosis Date  . A-fib (HCC)   . CHF (congestive heart failure) (HCC)   . Essential hypertension   . HLD (hyperlipidemia)   . MR (mitral regurgitation)   . Panic attack   . Pulmonary hypertension (HCC)   . TR (tricuspid regurgitation)    Past Surgical History:  Past Surgical History:  Procedure Laterality Date  . CARDIOVERSION N/A 12/25/2015   Procedure: CARDIOVERSION;  Surgeon: Dolores Patty, MD;  Location: [facility] ENDOSCOPY;  Service: Cardiovascular;  Laterality: N/A;  . CARDIOVERSION N/A 01/04/2018   Procedure: CARDIOVERSION;  Surgeon: Dolores Patty, MD;  Location: [facility] ENDOSCOPY;  Service: Cardiovascular;  Laterality: N/A;  . HERNIA REPAIR    . REPLACEMENT TOTAL KNEE    . TONSILLECTOMY     HPI:   Kelly Dickson is a 72 y.o. female with medical history significant for NICM (last EF 35% in February 2019), paroxysmal A. fib on Eliquis, TIA in 2018, hypertension, hyperlipidemia and peripheral neuropathy who presented to the ED from  home via EMS with her daughter, [name], with 1 week of progressing confusion, mild dysarthria, dysgraphia and weakness. MRI negative, MD reports symptoms most likely related to Toxic encephalopathy in setting of renal failure. Pt on numerous nephrotoxic meds, then started on Bactrim, with likely decreased PO intake, nausea and diarrhea.    Assessment / Plan / Recommendation Clinical Impression  Pt demonstrates cognitive deficits consistent with acute encephalopathy. Pt is anxious, mildly perseverative. SHe is unable to sustain attention to basic verbal and functional tasks to complete them, also complicated by deficits in working memory. Pt needs simple one step instructions and frequent redirection. Pt would benefit from f/u with SLP in rehabilitative environment prior to d/c home given need for full supervision and max assist with basic cognitive tasks.     SLP Assessment  SLP Recommendation/Assessment: Patient needs continued Speech Lanaguage Pathology Services SLP Visit Diagnosis: Cognitive communication deficit (R41.841)    Follow Up Recommendations       Frequency and Duration min 2x/week  2 weeks      SLP Evaluation Cognition  Overall Cognitive Status: Impaired/Different from baseline Arousal/Alertness: Awake/alert Orientation Level: Oriented to person;Oriented to place;Oriented to situation;Disoriented to time Attention: Focused;Sustained Focused Attention: Appears intact Sustained Attention: Impaired Sustained Attention Impairment: Verbal basic;Functional basic Memory: Impaired Memory Impairment: Storage deficit;Retrieval deficit Awareness: Impaired Awareness Impairment: Emergent impairment;Anticipatory impairment Problem Solving: Impaired Problem Solving Impairment: Verbal basic;Functional basic Executive Function: Sequencing;Organizing Sequencing: Impaired Sequencing Impairment: Verbal basic;Functional basic Behaviors: Perseveration;Poor frustration  tolerance Safety/Judgment: Impaired  Comprehension  Auditory Comprehension Overall Auditory Comprehension: Appears within functional limits for tasks assessed    Expression Verbal Expression Overall Verbal Expression: Other (comment)(occasional language of confusion)   Oral / Motor  Oral Motor/Sensory Function Overall Oral Motor/Sensory Function: Within functional limits Motor Speech Overall Motor Speech: Impaired Respiration: Within functional limits Phonation: Normal Resonance: Within functional limits Articulation: Impaired Level of Impairment: Conversation Intelligibility: Intelligible Word: 75-100% accurate Phrase: 75-100% accurate Sentence: 75-100% accurate Conversation: 75-100% accurate Motor Planning: Witnin functional limits Motor Speech Errors: Consistent;Unaware   GO                    Chenise Mulvihill, Riley Nearing 04/14/2018, 12:23 PM

## 2018-04-14 NOTE — Progress Notes (Signed)
Pt to discharge to [facility], report called to [place].  [id number]

## 2018-04-14 NOTE — NC FL2 (Signed)
[place] MEDICAID FL2 LEVEL OF CARE SCREENING TOOL     IDENTIFICATION  Patient Name: Kelly Dickson Birthdate: 17-May-1946 Sex: female Admission Date (Current Location): 04/12/2018  [facility] and [place] Number:  [employer] and Address:  [facility] [place], [address] [place], [place] [address]      Provider Number: [id number]  Attending Physician Name and Address:  Rodolph Bong, MD  Relative Name and Phone Number:       Current Level of Care: Hospital Recommended Level of Care: Skilled Nursing Facility Prior Approval Number:    Date Approved/Denied:   PASRR Number: [id number]  Discharge Plan: SNF    Current Diagnoses: Patient Active Problem List   Diagnosis Date Noted  . Acute metabolic encephalopathy 04/13/2018  . Acute kidney injury (HCC)   . Hyperkalemia   . Chronic systolic CHF (congestive heart failure) (HCC)   . AKI (acute kidney injury) (HCC) 04/12/2018  . TIA (transient ischemic attack) 04/27/2017  . Bradycardia 01/09/2016  . PAF (paroxysmal atrial fibrillation) (HCC)   . LBBB (left bundle branch block) 12/22/2015  . Leg swelling 11/25/2015  . Cellulitis of leg, left 11/25/2015  . Panic attack   . CHF (congestive heart failure) (HCC)   . Essential hypertension   . MR (mitral regurgitation)   . TR (tricuspid regurgitation)   . Pulmonary hypertension (HCC)   . HLD (hyperlipidemia)     Orientation RESPIRATION BLADDER Height & Weight     Self, Time, Situation, Place  Normal Continent Weight: 232 lb 9.4 oz (105.5 kg) Height:  5\' 4"  (162.6 cm)  BEHAVIORAL SYMPTOMS/MOOD NEUROLOGICAL BOWEL NUTRITION STATUS      Continent Diet(heart healthy)  AMBULATORY STATUS COMMUNICATION OF NEEDS Skin   Limited Assist Verbally Skin abrasions(left leg abrasion)                       Personal Care Assistance Level of Assistance  Bathing, Feeding, Dressing Bathing Assistance: Limited assistance Feeding assistance:  Independent Dressing Assistance: Limited assistance     Functional Limitations Info  Sight, Hearing, Speech Sight Info: Impaired Hearing Info: Adequate Speech Info: Adequate    SPECIAL CARE FACTORS FREQUENCY  PT (By licensed PT), OT (By licensed OT)     PT Frequency: 5x/wk OT Frequency: 5x/wk            Contractures Contractures Info: Not present    Additional Factors Info  Code Status, Allergies Code Status Info: Full Allergies Info: NKA           Current Medications (04/14/2018):  This is the current hospital active medication list Current Facility-Administered Medications  Medication Dose Route Frequency Provider Last Rate Last Dose  . 0.9 %  sodium chloride infusion   Intravenous Continuous Rodolph Bong, MD 75 mL/hr at 04/13/18 1200    . acetaminophen (TYLENOL) tablet 650 mg  650 mg Oral Q6H PRN Marcelo Baldy, MD   650 mg at 04/14/18 6270   Or  . acetaminophen (TYLENOL) suppository 650 mg  650 mg Rectal Q6H PRN Marcelo Baldy, MD      . apixaban Everlene Balls) tablet 5 mg  5 mg Oral BID Rodolph Bong, MD   5 mg at 04/14/18 0857  . aspirin chewable tablet 81 mg  81 mg Oral Daily Marcelo Baldy, MD   81 mg at 04/14/18 0858  . calcium-vitamin D (OSCAL WITH D) 500-200 MG-UNIT per tablet 1 tablet  1 tablet Oral  Daily Marcelo Baldy, MD   1 tablet at 04/14/18 [phone]  . docusate sodium (COLACE) capsule 100 mg  100 mg Oral BID Marcelo Baldy, MD   100 mg at 04/13/18 2131  . DULoxetine (CYMBALTA) DR capsule 30 mg  30 mg Oral Daily Marcelo Baldy, MD   30 mg at 04/14/18 0858  . gabapentin (NEURONTIN) capsule 100 mg  100 mg Oral QHS Marcelo Baldy, MD   100 mg at 04/13/18 2131  . gabapentin (NEURONTIN) capsule 100 mg  100 mg Oral BID PRN Rodolph Bong, MD   100 mg at 04/14/18 0122  . multivitamin with minerals tablet 1 tablet  1 tablet Oral Daily Marcelo Baldy, MD   1 tablet at 04/14/18 [id number]  . omega-3 acid ethyl esters (LOVAZA)  capsule 1 g  1 g Oral Daily Marcelo Baldy, MD   1 g at 04/14/18 0857  . ondansetron (ZOFRAN) tablet 4 mg  4 mg Oral Q6H PRN Marcelo Baldy, MD       Or  . ondansetron [facility]) injection 4 mg  4 mg Intravenous Q6H PRN Marcelo Baldy, MD      . polyethylene glycol [facility] / GLYCOLAX) packet 17 g  17 g Oral Daily PRN Marcelo Baldy, MD      . rosuvastatin (CRESTOR) tablet 40 mg  40 mg Oral q1800 Marcelo Baldy, MD   40 mg at 04/13/18 1758  . senna (SENOKOT) tablet 8.6 mg  1 tablet Oral BID Marcelo Baldy, MD   8.6 mg at 04/13/18 2131  . sodium chloride flush (NS) 0.9 % injection 3 mL  3 mL Intravenous Q12H Marcelo Baldy, MD   3 mL at 04/13/18 2131     Discharge Medications: Please see discharge summary for a list of discharge medications.  Relevant Imaging Results:  Relevant Lab Results:   Additional Information SS#: [id number]  Baldemar Lenis, LCSW

## 2018-04-14 NOTE — Evaluation (Signed)
Occupational Therapy Evaluation - Late Note  Patient Details Name: Kelly Dickson MRN: [id number] DOB: [birth year] Today's Date: 04/14/2018    History of Present Illness Patient is a 72 y/o female presenting with confusion and weakness. CT head showing no acute intracranial abnormality. Admitted with AKI secondary to medication effect. Patient with a PMH significant for NICM (last EF 35% in February 2019), paroxysmal A. fib on Eliquis, TIA in 2018, hypertension, hyperlipidemia and peripheral neuropathy.   Clinical Impression   PTA Pt mod I with use of SPC for mobility, completing all ADL/IADL. Pt is currently overall min guard for ADL physically but requiring increased assist with verbal cues for sequencing and activity completion. Pt will benefit from skilled occupational therapy at the SNF level post-acute stay to maximize safety and independence in ADL and functional transfers to return to PLOF, and for SAFETY as pt lives alone.    Follow Up Recommendations  SNF;Supervision/Assistance - 24 hour    Equipment Recommendations  Other (comment)(defer to next venue)    Recommendations for Other Services       Precautions / Restrictions Precautions Precautions: Fall Restrictions Weight Bearing Restrictions: No      Mobility Bed Mobility               General bed mobility comments: Pt OOB in recliner when OT entered  Transfers Overall transfer level: Needs assistance   Transfers: Sit to/from Stand Sit to Stand: Min guard;Min assist         General transfer comment: Assist for safety and sequencing/management of AD; poor safety awareness    Balance Overall balance assessment: Needs assistance Sitting-balance support: Feet supported Sitting balance-Leahy Scale: Fair     Standing balance support: Bilateral upper extremity supported;During functional activity Standing balance-Leahy Scale: Fair                             ADL either performed or assessed  with clinical judgement   ADL Overall ADL's : Needs assistance/impaired Eating/Feeding: Modified independent   Grooming: Min guard;Standing   Upper Body Bathing: Min guard;Sitting   Lower Body Bathing: Minimal assistance   Upper Body Dressing : Minimal assistance   Lower Body Dressing: Moderate assistance   Toilet Transfer: Min guard;Ambulation;RW;Comfort height toilet;Grab bars   Toileting- Clothing Manipulation and Hygiene: Min guard;Sit to/from stand Toileting - Clothing Manipulation Details (indicate cue type and reason): use of grab bars     Functional mobility during ADLs: Min guard;Rolling walker General ADL Comments: cues for sequencing - even with familiar ADL tasks     Vision Baseline Vision/History: Wears glasses Wears Glasses: At all times Patient Visual Report: No change from baseline Vision Assessment?: No apparent visual deficits     Perception     Praxis      Pertinent Vitals/Pain Pain Assessment: No/denies pain     Hand Dominance     Extremity/Trunk Assessment Upper Extremity Assessment Upper Extremity Assessment: Overall WFL for tasks assessed   Lower Extremity Assessment Lower Extremity Assessment: Defer to PT evaluation       Communication Communication Communication: No difficulties   Cognition Arousal/Alertness: Awake/alert Behavior During Therapy: WFL for tasks assessed/performed Overall Cognitive Status: Impaired/Different from baseline Area of Impairment: Orientation;Memory;Following commands;Safety/judgement;Problem solving                 Orientation Level: Disoriented to;Time   Memory: Decreased short-term memory Following Commands: Follows one step commands consistently;Follows one step commands with increased  time Safety/Judgement: Decreased awareness of safety;Decreased awareness of deficits   Problem Solving: Slow processing;Difficulty sequencing;Requires verbal cues;Requires tactile cues General Comments: tends  to perseverate on certain things, becomes tearful when speaking about memory   General Comments  Daughter present for session    Exercises     Shoulder Instructions      Home Living Family/patient expects to be discharged to:: Skilled nursing facility Living Arrangements: Alone Available Help at Discharge: Family Type of Home: House                              Lives With: Alone    Prior Functioning/Environment Level of Independence: Independent with assistive device(s)        Comments: SPC        OT Problem List: Impaired balance (sitting and/or standing);Decreased cognition;Decreased safety awareness      OT Treatment/Interventions:      OT Goals(Current goals can be found in the care plan section) Acute Rehab OT Goals Patient Stated Goal: walk better OT Goal Formulation: With patient Time For Goal Achievement: 04/28/18 Potential to Achieve Goals: Good  OT Frequency:     Barriers to D/C:            Co-evaluation              AM-PAC PT "6 Clicks" Daily Activity     Outcome Measure Help from another person eating meals?: None Help from another person taking care of personal grooming?: A Little Help from another person toileting, which includes using toliet, bedpan, or urinal?: A Little Help from another person bathing (including washing, rinsing, drying)?: A Little Help from another person to put on and taking off regular upper body clothing?: A Little Help from another person to put on and taking off regular lower body clothing?: A Lot 6 Click Score: 18   End of Session Equipment Utilized During Treatment: Gait belt;Rolling walker Nurse Communication: Mobility status  Activity Tolerance: Patient tolerated treatment well Patient left: in chair;with call bell/phone within reach;with chair alarm set;with family/visitor present  OT Visit Diagnosis: Unsteadiness on feet (R26.81);Other abnormalities of gait and mobility (R26.89);Other  symptoms and signs involving cognitive function                Time: 1497-0263 OT Time Calculation (min): 20 min Charges:  OT General Charges $OT Visit: 1 Visit OT Evaluation $OT Eval Moderate Complexity: 1 Mod G-Codes:     Kelly Dickson OTR/L [phone]  Kelly Dickson Kelly Dickson 04/14/2018, 8:43 PM

## 2018-04-14 NOTE — Progress Notes (Signed)
Patient discharged via [facility] to [facility].   Patient stable, all personal belongings sent with patient.  Patient's daughter-Stephanie notified of patient leaving

## 2018-04-19 ENCOUNTER — Encounter (HOSPITAL_COMMUNITY): Payer: Medicare Other | Admitting: Internal Medicine

## 2018-04-19 ENCOUNTER — Other Ambulatory Visit (HOSPITAL_COMMUNITY): Payer: Medicare Other

## 2018-04-21 ENCOUNTER — Encounter (HOSPITAL_COMMUNITY): Payer: Self-pay

## 2018-04-21 ENCOUNTER — Ambulatory Visit (HOSPITAL_COMMUNITY)
Admit: 2018-04-21 | Discharge: 2018-04-21 | Disposition: A | Discharge status: Home / Self Care | Payer: Medicare Other | Attending: Cardiology | Admitting: Cardiology

## 2018-04-21 VITALS — BP 158/92 | HR 95 | Wt 233.8 lb

## 2018-04-21 DIAGNOSIS — Z8249 Family history of ischemic heart disease and other diseases of the circulatory system: Secondary | ICD-10-CM | POA: Insufficient documentation

## 2018-04-21 DIAGNOSIS — I2510 Atherosclerotic heart disease of native coronary artery without angina pectoris: Secondary | ICD-10-CM | POA: Diagnosis not present

## 2018-04-21 DIAGNOSIS — I480 Paroxysmal atrial fibrillation: Secondary | ICD-10-CM | POA: Diagnosis present

## 2018-04-21 DIAGNOSIS — Z7982 Long term (current) use of aspirin: Secondary | ICD-10-CM | POA: Diagnosis not present

## 2018-04-21 DIAGNOSIS — E669 Obesity, unspecified: Secondary | ICD-10-CM | POA: Insufficient documentation

## 2018-04-21 DIAGNOSIS — I4819 Other persistent atrial fibrillation: Secondary | ICD-10-CM

## 2018-04-21 DIAGNOSIS — Z79899 Other long term (current) drug therapy: Secondary | ICD-10-CM | POA: Insufficient documentation

## 2018-04-21 DIAGNOSIS — I481 Persistent atrial fibrillation: Secondary | ICD-10-CM

## 2018-04-21 DIAGNOSIS — E785 Hyperlipidemia, unspecified: Secondary | ICD-10-CM | POA: Insufficient documentation

## 2018-04-21 DIAGNOSIS — I5022 Chronic systolic (congestive) heart failure: Secondary | ICD-10-CM | POA: Diagnosis not present

## 2018-04-21 DIAGNOSIS — I429 Cardiomyopathy, unspecified: Secondary | ICD-10-CM | POA: Insufficient documentation

## 2018-04-21 DIAGNOSIS — I272 Pulmonary hypertension, unspecified: Secondary | ICD-10-CM | POA: Diagnosis not present

## 2018-04-21 DIAGNOSIS — I110 Hypertensive heart disease with heart failure: Secondary | ICD-10-CM | POA: Diagnosis not present

## 2018-04-21 DIAGNOSIS — I447 Left bundle-branch block, unspecified: Secondary | ICD-10-CM

## 2018-04-21 DIAGNOSIS — R5381 Other malaise: Secondary | ICD-10-CM | POA: Diagnosis not present

## 2018-04-21 DIAGNOSIS — G4733 Obstructive sleep apnea (adult) (pediatric): Secondary | ICD-10-CM | POA: Diagnosis not present

## 2018-04-21 DIAGNOSIS — N179 Acute kidney failure, unspecified: Secondary | ICD-10-CM

## 2018-04-21 DIAGNOSIS — F410 Panic disorder [episodic paroxysmal anxiety] without agoraphobia: Secondary | ICD-10-CM | POA: Insufficient documentation

## 2018-04-21 LAB — BASIC METABOLIC PANEL
ANION GAP: 7 (ref 5–15)
BUN: 33 mg/dL — AB (ref 6–20)
CHLORIDE: 108 mmol/L (ref 101–111)
CO2: 27 mmol/L (ref 22–32)
Calcium: 9.2 mg/dL (ref 8.9–10.3)
Creatinine, Ser: 1.04 mg/dL — ABNORMAL HIGH (ref 0.44–1.00)
GFR calc Af Amer: >60 mL/min (ref >60)
GFR, EST NON AFRICAN AMERICAN: 52 mL/min — AB (ref >60)
GLUCOSE: 108 mg/dL — AB (ref 65–99)
POTASSIUM: 4.2 mmol/L (ref 3.5–5.1)
Sodium: 142 mmol/L (ref 135–145)

## 2018-04-21 LAB — CBC
HEMATOCRIT: 32.4 % — AB (ref 36.0–46.0)
HEMOGLOBIN: 9.8 g/dL — AB (ref 12.0–15.0)
MCH: 28.2 pg (ref 26.0–34.0)
MCHC: 30.2 g/dL (ref 30.0–36.0)
MCV: 93.4 fL (ref 78.0–100.0)
Platelets: 228 10*3/uL (ref 150–400)
RBC: 3.47 MIL/uL — ABNORMAL LOW (ref 3.87–5.11)
RDW: 14.6 % (ref 11.5–15.5)
WBC: 6.7 10*3/uL (ref 4.0–10.5)

## 2018-04-21 MED ORDER — SACUBITRIL-VALSARTAN 49-51 MG PO TABS: 1.0000 | ORAL_TABLET | Freq: Two times a day (BID) | ORAL | 11 refills | Status: DC

## 2018-04-21 MED ORDER — SACUBITRIL-VALSARTAN 49-51 MG PO TABS
1.0000 | ORAL_TABLET | Freq: Two times a day (BID) | ORAL | 11 refills | Status: DC
Start: 2018-04-21 — End: 2018-04-29

## 2018-04-21 MED FILL — ENTRESTO 49 MG-51 MG TABLET: 49-51 | 30 days supply | Qty: 60 | Fill #0

## 2018-04-21 NOTE — Progress Notes (Signed)
Patient ID: Kelly Dickson, female   DOB: 1946-04-17, 72 y.o.   MRN: [id number]    Advanced Heart Failure Clinic Note   Primary Care: Dr Luiz Iron Primary Cardiologist: Dr Willeen Cass Primary HF: Dr. Gala Romney   HPI: Kelly Dickson is a 72 y.o. female with obesity, PAF and systolic HF (with recovered ejection fraction)  Diagnosed with systolic HF in 2016 at [facility]. EF 25-30%  We saw her for the first time in 2/17 and NICM felt to be due to OSA, LBBB and/or PAF. She was not tolerating AF well. So underwent DC-CV on 12/25/15. Post DC-CV EF and symptoms improved. Echo 12/31/15 EF 45-50%, Severe LAE, Mild AS, Mild MR  Admitted to [facility] in [place], [place] 04/2017 with symptoms concerning for TIA/CVA with RUE weakness that quickly resolved. Complete work up as below with no clear acute cause. Echo 55-60% with severe LAE and mild/mod MR.  Had recurrent AF on 01/04/18 and underwent DC-CV.   Amiodarone stopped 03/23/18 with plan to focus on rate control.   Admitted 6/11 through 04/14/2018 . Admitted with AKI thought to be from bactrim that was taking for cellulitis. Spironolactone, entresto, and lasix held. She received IVF. Discharged off spiro and entresto.   Today she returns for post hospital follow up. Overall feeling ok. Mild dyspnea with exertion.  Denies PND/Orthopnea. Appetite ok. She has had high salt food foods at SNF. Continues to work with PT/OT.  No fever or chills. Weight has been 233 pounds. Taking all medications.    ECHO 04/2018 EF 30-35%  ECHO 12/2017 EF 35-40%, left atrium severely dilated.  Echo 04/19/17 @ [facility] in [place], [place] LVEF 55-60%, Severe LAE, Mild/mod MR, Mild TR,  Echo 12/31/15 EF 45-50%, Severe LAE, Mild AS, Mild MR Echo 05/23/15 [facility] LVEF 25-30%, RV mild/mod reduced, Mode LAE, mild RAE, Mod MR, Mod TR, RVSP(TR) 70.5 mm Hg  Myoview 05/14/15 - [facility] Mild ischemia in the apical septal region. EF ~ 36%.  LHC 06/27/15 - Dr Rolly Salter Willeen Cass -  Right dominant anatomy - Left main - medium size and normal - LAD - medium caliber, 20% lesion in proximal portion - LCx - medium caliber, with few obtuse marginal branches but normal - RCA - large and dominant artery, without signifcant disease.  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 No acute abnormality 2. Intracranial atherosclerosis 3. Asymmetric volume loss and mild chronic small vessel ischemic changes in the left greater than right cerebral hemisphere  US carotid 04/19/17 @ [facility] in [place], [place] - No hemodynamically significant stenosis.   MRI Brain, without contrast 04/19/17 @ [facility] in [place], [place] 1. Asymmetrically diminished caliber of the L ICA. Poor visual of left MCA 2. Assymmetric volume loss in the left cerebral hemisphere 3. 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 Mild chronic small vessel ischemic changes in the left greater than right cerebral hemisphere   Review of systems complete and found to be negative unless listed in HPI.    Past Medical History:  Diagnosis Date  . A-fib (HCC)   . CHF (congestive heart failure) (HCC)   . Essential hypertension   . HLD (hyperlipidemia)   . MR (mitral regurgitation)   . Panic attack   . Pulmonary hypertension (HCC)   . TR (tricuspid regurgitation)     Current Outpatient Medications  Medication Sig Dispense Refill  . acetaminophen (TYLENOL)  650 MG CR tablet Take 1,300 mg by mouth daily.     [name] aspirin 81 MG chewable tablet Chew 81 mg by mouth daily.    . Bioflavonoid Products (VITAMIN C/BIOFLAVONOIDS) 1000-25 MG TABS Take 1 tablet by mouth at bedtime. Reported on 12/24/2015    . Calcium Carbonate-Vitamin D (CALCIUM-VITAMIN D) 500-200 MG-UNIT tablet Take 1 tablet by mouth daily.    . Coenzyme Q10 (COQ10) 100 MG CAPS Take 100 mg by mouth daily.     . DULoxetine (CYMBALTA) 30 MG capsule Take 30 mg by  mouth daily.    [name] ELIQUIS 5 MG TABS tablet TAKE 1 TABLET BY MOUTH 2 TIMES A DAY 60 tablet 6  . furosemide (LASIX) 40 MG tablet Take 1 tablet (40 mg total) by mouth daily. 30 tablet 3  . gabapentin (NEURONTIN) 300 MG capsule Take 1 capsule (300 mg total) by mouth 2 (two) times daily. 60 capsule 0  . lactobacillus acidophilus & bulgar (LACTINEX) chewable tablet Chew 3 tablets by mouth at bedtime.     [name] LECITHIN PO Take 2 tablets by mouth at bedtime.     . Liniments (SALONPAS ARTHRITIS PAIN RELIEF EX) Place 1 patch onto the skin daily as needed (pain).    [name] loratadine (CLARITIN) 10 MG tablet Take 10 mg by mouth at bedtime.     [name] losartan (COZAAR) 50 MG tablet Take 1 tablet (50 mg total) by mouth daily. 30 tablet 0  . Menthol, Topical Analgesic, (BIOFREEZE EX) Apply 1 application topically 2 (two) times daily as needed (pain).    . Multiple Vitamins-Minerals (MULTIVITAMIN WITH MINERALS) tablet Take 1 tablet by mouth daily.    . Omega-3 Fatty Acids (FISH OIL) 1000 MG CAPS Take 1,000 mg by mouth daily.     . Red Yeast Rice 600 MG CAPS Take 600 mg by mouth at bedtime.     . rosuvastatin (CRESTOR) 40 MG tablet Take 1 tablet (40 mg total) by mouth daily at 6 PM. 30 tablet 0  . senna (SENOKOT) 8.6 MG TABS tablet Take 1 tablet (8.6 mg total) by mouth 2 (two) times daily. 120 each 0  . vitamin E 400 UNIT capsule Take 400 Units by mouth daily.      No current facility-administered medications for this encounter.     No Known Allergies    Social History   Socioeconomic History  . Marital status: Divorced    Spouse name: Not on file  . Number of children: Not on file  . Years of education: Not on file  . Highest education level: Not on file  Occupational History  . Not on file  Social Needs  . Financial resource strain: Not on file  . Food insecurity:    Worry: Not on file    Inability: Not on file  . Transportation needs:    Medical: Not on file    Non-medical: Not on file  Tobacco Use  .  Smoking status: Never Smoker  . Smokeless tobacco: Never Used  Substance and Sexual Activity  . Alcohol use: No  . Drug use: No  . Sexual activity: Not on file  Lifestyle  . Physical activity:    Days per week: Not on file    Minutes per session: Not on file  . Stress: Not on file  Relationships  . Social connections:    Talks on phone: Not on file    Gets together: Not on file    Attends religious service: Not on file  Active member of club or organization: Not on file    Attends meetings of clubs or organizations: Not on file    Relationship status: Not on file  . Intimate partner violence:    Fear of current or ex partner: Not on file    Emotionally abused: Not on file    Physically abused: Not on file    Forced sexual activity: Not on file  Other Topics Concern  . Not on file  Social History Narrative  . Not on file     Family History  Problem Relation Age of Onset  . Breast cancer Mother   . Hypertension Sister     Vitals:   04/21/18 1032  BP: (!) 158/92  Pulse: 95  SpO2: 99%  Weight: 233 lb 12.8 oz (106.1 kg)    Wt Readings from Last 3 Encounters:  04/21/18 233 lb 12.8 oz (106.1 kg)  04/13/18 232 lb 9.4 oz (105.5 kg)  03/23/18 223 lb 12.8 oz (101.5 kg)    PHYSICAL EXAM: General:  . No resp difficulty. Ariived in wheel chair.  HEENT: normal Neck: supple. JVP ~10 . Carotids 2+ bilat; no bruits. No lymphadenopathy or thryomegaly appreciated. Cor: PMI nondisplaced. Irregular  rate & rhythm. No rubs, gallops or murmurs. Lungs: clear Abdomen: obese, soft, nontender, nondistended. No hepatosplenomegaly. No bruits or masses. Good bowel sounds. Extremities: no cyanosis, clubbing, rash, R and LLE 2+ edema Neuro: alert & orientedx3, cranial nerves grossly intact. moves all 4 extremities w/o difficulty. Affect pleasant  ECG:   A fib 83 bpm   ASSESSMENT & PLAN:  1. Chronic systolic HF -  ECHO 04/2018 EF 16-10% left atrium severely dilated - NYHA III. Volume  status trending up.  Continue lasix 40 mg daily. - Off Coreg with bradycardia.  - Restart entresto 49-51 mg twice a day. Stop losartan.  - Next visit consider spiro.   2. Chronic A fib.   S/p DCCV 12/25/15 and 12/2017 Amiodarone stopped 5/22 due to recurrent A fib and left atrial enlargement.  - Todays she is in A fib. Rate controlled.  - Continue Eliquis 5 mg BID. Denies bleeding.    3. Mod MR/Mod TR  - Both mild on recent Echo in [place].   4. 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 TR Max PG 55.5 mm Hg by previous echo  - Peak PA pressure 36 mm hg    5. HLD - Per PCP. - On statin  6. OSA - Continue CPAP nightly.  7. LBBB, 160 ms on ECHO 2019  8. 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  9. Deconditioning Continue therapy at SNF.  10. LLE wound - Resolved.  11. HTN - elevated. Increase entresto 49-51 mg twice a day  12. TIA - With admission in 2018. Work up with no acute changes, but chronic changes effecting left MCA territory.  - Continue Eliquis.  - Follows with Neuro  13. Obesity Body mass index is 40.13 kg/m.  14. AKI recent hospitalization in the setting of bactrim.  Check BMET today and in 2 weeks.   Follow up in 2 weeks.  Greater than 50% of the (total minutes 40) visit spent in counseling/coordination of care regarding medication changes and lab results.      Tonye Becket, NP  04/21/2018 10:34 AM

## 2018-04-21 NOTE — Patient Instructions (Addendum)
STOP Losartan.  START Entresto 49-51 mg tablet twice daily. Use 30 day free card.  Follow up as scheduled with Dr. Gala Romney.  Take all medication as prescribed the day of your appointment. Bring all medications with you to your appointment.  Do the following things EVERYDAY: 1) Weigh yourself in the morning before breakfast. Write it down and keep it in a log. 2) Take your medicines as prescribed 3) Eat low salt foods-Limit salt (sodium) to 2000 mg per day.  4) Stay as active as you can everyday 5) Limit all fluids for the day to less than 2 liters

## 2018-04-26 ENCOUNTER — Telehealth (HOSPITAL_COMMUNITY): Payer: Self-pay

## 2018-04-26 NOTE — Telephone Encounter (Signed)
Spoke to daughter  No medication allergies Rocephin ok with medications and renal function

## 2018-04-26 NOTE — Telephone Encounter (Signed)
Daughter calling CHF clinic triage line concerned about patient getting "shots of rocephin" for her UTI. States previous shots of bactrim caused her to go into acute kidney failure. Will forward to CHF clinical pharmacist Leota Sauers to advise.  Ave Filter, RN

## 2018-04-29 ENCOUNTER — Encounter (HOSPITAL_COMMUNITY): Payer: Self-pay | Admitting: Internal Medicine

## 2018-04-29 ENCOUNTER — Ambulatory Visit (HOSPITAL_COMMUNITY)
Admit: 2018-04-29 | Discharge: 2018-04-29 | Disposition: A | Discharge status: Home / Self Care | Payer: Medicare Other | Source: Ambulatory Visit | Attending: Internal Medicine | Admitting: Internal Medicine

## 2018-04-29 VITALS — BP 137/57 | HR 100 | Wt 237.0 lb

## 2018-04-29 DIAGNOSIS — I481 Persistent atrial fibrillation: Secondary | ICD-10-CM | POA: Diagnosis not present

## 2018-04-29 DIAGNOSIS — G4733 Obstructive sleep apnea (adult) (pediatric): Secondary | ICD-10-CM | POA: Diagnosis not present

## 2018-04-29 DIAGNOSIS — I495 Sick sinus syndrome: Secondary | ICD-10-CM | POA: Insufficient documentation

## 2018-04-29 DIAGNOSIS — E785 Hyperlipidemia, unspecified: Secondary | ICD-10-CM | POA: Diagnosis not present

## 2018-04-29 DIAGNOSIS — I5022 Chronic systolic (congestive) heart failure: Secondary | ICD-10-CM | POA: Diagnosis not present

## 2018-04-29 DIAGNOSIS — Z7901 Long term (current) use of anticoagulants: Secondary | ICD-10-CM | POA: Insufficient documentation

## 2018-04-29 DIAGNOSIS — Z8249 Family history of ischemic heart disease and other diseases of the circulatory system: Secondary | ICD-10-CM | POA: Insufficient documentation

## 2018-04-29 DIAGNOSIS — Z8673 Personal history of transient ischemic attack (TIA), and cerebral infarction without residual deficits: Secondary | ICD-10-CM | POA: Diagnosis not present

## 2018-04-29 DIAGNOSIS — I482 Chronic atrial fibrillation: Secondary | ICD-10-CM | POA: Diagnosis not present

## 2018-04-29 DIAGNOSIS — I428 Other cardiomyopathies: Secondary | ICD-10-CM | POA: Diagnosis not present

## 2018-04-29 DIAGNOSIS — I2510 Atherosclerotic heart disease of native coronary artery without angina pectoris: Secondary | ICD-10-CM | POA: Diagnosis not present

## 2018-04-29 DIAGNOSIS — Z7982 Long term (current) use of aspirin: Secondary | ICD-10-CM | POA: Insufficient documentation

## 2018-04-29 DIAGNOSIS — I5020 Unspecified systolic (congestive) heart failure: Secondary | ICD-10-CM | POA: Diagnosis present

## 2018-04-29 DIAGNOSIS — E669 Obesity, unspecified: Secondary | ICD-10-CM | POA: Insufficient documentation

## 2018-04-29 DIAGNOSIS — Z6841 Body Mass Index (BMI) 40.0 and over, adult: Secondary | ICD-10-CM | POA: Diagnosis not present

## 2018-04-29 DIAGNOSIS — Z803 Family history of malignant neoplasm of breast: Secondary | ICD-10-CM | POA: Insufficient documentation

## 2018-04-29 DIAGNOSIS — I447 Left bundle-branch block, unspecified: Secondary | ICD-10-CM | POA: Insufficient documentation

## 2018-04-29 DIAGNOSIS — Z79899 Other long term (current) drug therapy: Secondary | ICD-10-CM | POA: Diagnosis not present

## 2018-04-29 DIAGNOSIS — I272 Pulmonary hypertension, unspecified: Secondary | ICD-10-CM | POA: Diagnosis not present

## 2018-04-29 DIAGNOSIS — I4819 Other persistent atrial fibrillation: Secondary | ICD-10-CM

## 2018-04-29 DIAGNOSIS — I110 Hypertensive heart disease with heart failure: Secondary | ICD-10-CM | POA: Insufficient documentation

## 2018-04-29 DIAGNOSIS — F410 Panic disorder [episodic paroxysmal anxiety] without agoraphobia: Secondary | ICD-10-CM | POA: Insufficient documentation

## 2018-04-29 DIAGNOSIS — N179 Acute kidney failure, unspecified: Secondary | ICD-10-CM

## 2018-04-29 MED ORDER — FUROSEMIDE 40 MG PO TABS: 40.0000 mg | ORAL_TABLET | Freq: Two times a day (BID) | ORAL | 0 refills | Status: DC

## 2018-04-29 MED ORDER — FUROSEMIDE 40 MG PO TABS: ORAL_TABLET | ORAL | 0 refills | Status: DC

## 2018-04-29 MED ORDER — SACUBITRIL-VALSARTAN 97-103 MG PO TABS: 1.0000 | ORAL_TABLET | Freq: Two times a day (BID) | ORAL | 3 refills | Status: DC

## 2018-04-29 MED FILL — FUROSEMIDE 40 MG TAB: 40 | 30 days supply | Qty: 60 | Fill #0

## 2018-04-29 MED FILL — ENTRESTO 97 MG-103 MG TAB: 97-103 | 30 days supply | Qty: 60 | Fill #0

## 2018-04-29 NOTE — Patient Instructions (Signed)
Increase Furosemide 40 mg (1 tab), twice a day until weight is at 232 lbs  -Then decrease to 40 mg daily. If weightt is above 235 take extra 40 mg tab.   Increase Entresto 97/103 mg (1 tab), twice a day  Your physician recommends that you schedule a follow-up appointment in: 3 months with Dr. Gala Romney

## 2018-04-29 NOTE — Progress Notes (Signed)
Patient ID: MCKENZEE BEEM, female   DOB: 02-27-1946, 72 y.o.   MRN: [id number]    Advanced Heart Failure Clinic Note   Primary Care: Dr Luiz Iron Primary Cardiologist: Dr Willeen Cass Primary HF: Dr. Gala Romney   HPI: NORMA MONTEMURRO is a 72 y.o. female with obesity, PAF and systolic HF (with recovered ejection fraction)  Diagnosed with systolic HF in 2016 at [facility]. EF 25-30%  We saw her for the first time in 2/17 and NICM felt to be due to OSA, LBBB and/or PAF. She was not tolerating AF well. So underwent DC-CV on 12/25/15. Post DC-CV EF and symptoms improved. Echo 12/31/15 EF 45-50%, Severe LAE, Mild AS, Mild MR  Admitted to [facility] in [place], [place] 04/2017 with symptoms concerning for TIA/CVA with RUE weakness that quickly resolved. Complete work up as below with no clear acute cause. Echo 55-60% with severe LAE and mild/mod MR.  Had recurrent AF on 01/04/18 and underwent DC-CV.   Amiodarone stopped 03/23/18 with plan to focus on rate control.   Admitted 6/11 through 04/14/2018 . Admitted with AKI thought to be from bactrim that was taking for cellulitis. Spironolactone, entresto, and lasix held. She received IVF. Discharged off spiro and entresto. Echo repeated and EF back down to 30-35%  She returns today for regular follow up. She is here with her daughter [name]. Last visit, Sherryll Burger was restarted at 49/51 mg BID. Overall doing ok. She is at [facility] doing PT/OT. Also working with nutritionist to lear ways to lose weight. She has BLE edema. She is SOB after walking in the hallway. No SOB with ADLs. Sleeps sitting up at baseline. No dizziness or CP. She has palpitations with activity. Wearing CPAP qHS. Still fatigued. Weights trending up, but not weighing daily at SNF. Taking all medications. Eating low salt diet and limits fluid intake.    ECHO 04/2018 EF 30-35%  ECHO 12/2017 EF 35-40%, left atrium severely dilated.  Echo 04/19/17 @ [facility] in [place], [place]  LVEF 55-60%, Severe LAE, Mild/mod MR, Mild TR,  Echo 12/31/15 EF 45-50%, Severe LAE, Mild AS, Mild MR Echo 05/23/15 [facility] LVEF 25-30%, RV mild/mod reduced, Mode LAE, mild RAE, Mod MR, Mod TR, RVSP(TR) 70.5 mm Hg  Myoview 05/14/15 - [facility] Mild ischemia in the apical septal region. EF ~ 36%.  LHC 06/27/15 - Dr Rolly Salter Willeen Cass - Right dominant anatomy - Left main - medium size and normal - LAD - medium caliber, 20% lesion in proximal portion - LCx - medium caliber, with few obtuse marginal branches but normal - RCA - large and dominant artery, without signifcant disease.  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 No acute abnormality 2. Intracranial atherosclerosis 3. Asymmetric volume loss and mild chronic small vessel ischemic changes in the left greater than right cerebral hemisphere  US carotid 04/19/17 @ [facility] in [place], [place] - No hemodynamically significant stenosis.   MRI Brain, without contrast 04/19/17 @ [facility] in [place], [place] 1. Asymmetrically diminished caliber of the L ICA. Poor visual of left MCA 2. Assymmetric volume loss in the left cerebral hemisphere 3. 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 Mild chronic small vessel ischemic changes in the left greater than right cerebral hemisphere  Review of systems complete and found to be negative unless listed in HPI.   Past Medical History:  Diagnosis Date  . A-fib (HCC)   .  CHF (congestive heart failure) (HCC)   . Essential hypertension   . HLD (hyperlipidemia)   . MR (mitral regurgitation)   . Panic attack   . Pulmonary hypertension (HCC)   . TR (tricuspid regurgitation)     Current Outpatient Medications  Medication Sig Dispense Refill  . acetaminophen (TYLENOL) 650 MG CR tablet Take 1,300 mg by mouth daily.     [name] aspirin 81 MG chewable tablet Chew 81 mg by mouth daily.    . Bioflavonoid Products  (VITAMIN C/BIOFLAVONOIDS) 1000-25 MG TABS Take 1 tablet by mouth at bedtime. Reported on 12/24/2015    . Calcium Carbonate-Vitamin D (CALCIUM-VITAMIN D) 500-200 MG-UNIT tablet Take 1 tablet by mouth daily.    . Coenzyme Q10 (COQ10) 100 MG CAPS Take 100 mg by mouth daily.     . DULoxetine (CYMBALTA) 30 MG capsule Take 30 mg by mouth daily.    [name] ELIQUIS 5 MG TABS tablet TAKE 1 TABLET BY MOUTH 2 TIMES A DAY 60 tablet 6  . furosemide (LASIX) 40 MG tablet Take 1 tablet (40 mg total) by mouth daily. 30 tablet 3  . gabapentin (NEURONTIN) 300 MG capsule Take 1 capsule (300 mg total) by mouth 2 (two) times daily. 60 capsule 0  . lactobacillus acidophilus & bulgar (LACTINEX) chewable tablet Chew 3 tablets by mouth at bedtime.     [name] LECITHIN PO Take 2 tablets by mouth at bedtime.     . Liniments (SALONPAS ARTHRITIS PAIN RELIEF EX) Place 1 patch onto the skin daily as needed (pain).    [name] loratadine (CLARITIN) 10 MG tablet Take 10 mg by mouth at bedtime.     . Menthol, Topical Analgesic, (BIOFREEZE EX) Apply 1 application topically 2 (two) times daily as needed (pain).    . Multiple Vitamins-Minerals (MULTIVITAMIN WITH MINERALS) tablet Take 1 tablet by mouth daily.    . Omega-3 Fatty Acids (FISH OIL) 1000 MG CAPS Take 1,000 mg by mouth daily.     . Red Yeast Rice 600 MG CAPS Take 600 mg by mouth at bedtime.     . rosuvastatin (CRESTOR) 40 MG tablet Take 1 tablet (40 mg total) by mouth daily at 6 PM. 30 tablet 0  . sacubitril-valsartan (ENTRESTO) 49-51 MG Take 1 tablet by mouth 2 (two) times daily. 60 tablet 11  . senna (SENOKOT) 8.6 MG TABS tablet Take 1 tablet (8.6 mg total) by mouth 2 (two) times daily. 120 each 0  . vitamin E 400 UNIT capsule Take 400 Units by mouth daily.      No current facility-administered medications for this encounter.     No Known Allergies    Social History   Socioeconomic History  . Marital status: Divorced    Spouse name: Not on file  . Number of children: Not on file    . Years of education: Not on file  . Highest education level: Not on file  Occupational History  . Not on file  Social Needs  . Financial resource strain: Not on file  . Food insecurity:    Worry: Not on file    Inability: Not on file  . Transportation needs:    Medical: Not on file    Non-medical: Not on file  Tobacco Use  . Smoking status: Never Smoker  . Smokeless tobacco: Never Used  Substance and Sexual Activity  . Alcohol use: No  . Drug use: No  . Sexual activity: Not on file  Lifestyle  . Physical activity:  Days per week: Not on file    Minutes per session: Not on file  . Stress: Not on file  Relationships  . Social connections:    Talks on phone: Not on file    Gets together: Not on file    Attends religious service: Not on file    Active member of club or organization: Not on file    Attends meetings of clubs or organizations: Not on file    Relationship status: Not on file  . Intimate partner violence:    Fear of current or ex partner: Not on file    Emotionally abused: Not on file    Physically abused: Not on file    Forced sexual activity: Not on file  Other Topics Concern  . Not on file  Social History Narrative  . Not on file     Family History  Problem Relation Age of Onset  . Breast cancer Mother   . Hypertension Sister     Vitals:   04/29/18 1210  BP: (!) 137/57  Pulse: 100  SpO2: 100%  Weight: 237 lb (107.5 kg)    Wt Readings from Last 3 Encounters:  04/29/18 237 lb (107.5 kg)  04/21/18 233 lb 12.8 oz (106.1 kg)  04/13/18 232 lb 9.4 oz (105.5 kg)    PHYSICAL EXAM: General: Obese woman. No resp difficulty. Arrived in wheelchair HEENT: Normal anicteric Neck: Supple. JVP ~8. Carotids 2+ bilat; no bruits. No thyromegaly or nodule noted. Cor: PMI nondisplaced. IRR, No M/G/R noted Lungs: CTAB, normal effort. No wheeze Abdomen: Marked central obesity Soft, non-tender, non-distended, no HSM. No bruits or masses. +BS  Extremities:  No cyanosis, clubbing, or rash. R and LLE 1+ edema R>L Neuro: alert & oriented x 3, cranial nerves grossly intact. moves all 4 extremities w/o difficulty. Affect pleasant   EKG: Afib 76 bpm with LBBB (QRS 162 ms)  ASSESSMENT & PLAN:  1. Chronic systolic HF -  ECHO 04/2018 EF 14-78% left atrium severely dilated - NYHA III. Volume status mildly elevated. Goal weight 230-232 lbs.  - Increase lasix to 40 mg BID until weight is 232 lbs, then back to 40 mg daily. Okay to take extra 40 mg of lasix for weight >235 lbs.  - Off Coreg with bradycardia.  - Increase Entresto to 97/103 mg twice a day.  Cleda Daub on hold with recent AKI. Consider restarting next visit.  2. Chronic A fib.   S/p DCCV 12/25/15 and 12/2017 Amiodarone stopped 5/22 due to recurrent A fib and left atrial enlargement.  - Remains in A fib, rate controlled. - Continue Eliquis 5 mg BID. Denies bleeding.   3. Mod MR/Mod TR  - Both mild on recent Echo in [place].  No change.  4. 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 TR Max PG 55.5 mm Hg by previous echo  - Peak PA pressure 36 mm hg. No change.  5. HLD - Per PCP. - Continue statin  6. OSA - Continue CPAP nightly. No change.  7. LBBB, 160 ms on ECHO 2019  - Refer to EP 8. 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 No change.  9. Deconditioning Continue therapy at SNF. No change.  10. LLE wound - Resolved. No change 11. HTN - Elevated today. Med changes as above.  12. TIA - With admission in 2018. Work up with no acute changes, but chronic changes effecting left MCA territory.  - Continue Eliquis - Follows with Neuro  13. Obesity Body  mass index is 40.68 kg/m.  14. AKI recent hospitalization in the setting of bactrim.  - Resolved. Creatinine 1.04 last week  Increase Entresto 97/103 Increase lasix as above Refer to EP for ?CRT  Alford Highland, NP  04/29/2018 12:15 PM   Patient seen and examined with the above-signed Advanced Practice Provider and/or  Housestaff. I personally reviewed laboratory data, imaging studies and relevant notes. I independently examined the patient and formulated the important aspects of the plan. I have edited the note to reflect any of my changes or salient points. I have personally discussed the plan with the patient and/or family.  Recently admitted with AKI in setting of bactrim and n/v/d. Now resolved. Ech done during that admission and EF back down to 30-35%. Still at [facility]. Weight trending up. In looking back through weights it seems like dry weight 230-232 range. Agree with adding back Entresto and changing lasix as above. Stressed use of sliding scale lasix. Remains in AF but seems to tolerate well. Has wide LBBB. If can avoid recurrent infections (UTI and cellulitis) and EF not recovering can consider referral to EP for CRT-P.   Arvilla Meres, MD  8:55 PM

## 2018-05-07 ENCOUNTER — Telehealth: Payer: Self-pay | Admitting: Physician Assistant

## 2018-05-07 NOTE — Telephone Encounter (Signed)
Kelly Dickson is a 72 y.o. female with chronic systolic heart failure secondary to nonischemic cardiomyopathy, paroxysmal atrial fibrillation.  She is not on beta-blocker secondary to bradycardia.  She recently had her ARB changed to [facility].  Dose was increased to 97/103 mg twice daily last week.  The patient is staying at [facility]  Her daughter called today noting that the patient's blood pressures are running in the 90s systolic in the evenings.  The patient seems to be symptomatic with lethargy.  She denies syncope.  Spoke with the patient's nurse [name]) at [phone].  The patient's blood pressure today is in the 140s systolic.  PLAN: 1.  Will decrease Entresto to 97/103 mg every morning and 49/51 mg every afternoon 2.  Follow-up Monday with record of blood pressure readings 3.  If blood pressure above target, her dose can certainly be adjusted back to 97/103 mg twice daily  Tereso Newcomer, PA-C    05/07/2018 2:13 PM

## 2018-05-09 NOTE — Telephone Encounter (Signed)
Spoke w/RN at [employer], pt's BP has been running high, Sat 93/51 111/57  Sherryll Burger was decreased Sun 131/69 Mon 120/70 131/52  Advised per [employer] note pt should increase Entresto back up to 97/103 BID, continue to monitor BP and call us with any further hypotension.  She verbalizes understanding.

## 2018-06-06 ENCOUNTER — Other Ambulatory Visit (HOSPITAL_COMMUNITY): Payer: Self-pay | Admitting: Physician Assistant

## 2018-06-06 DIAGNOSIS — T84032A Mechanical loosening of internal right knee prosthetic joint, initial encounter: Secondary | ICD-10-CM

## 2018-06-13 ENCOUNTER — Ambulatory Visit (HOSPITAL_COMMUNITY)
Admission: RE | Admit: 2018-06-13 | Discharge: 2018-06-13 | Disposition: A | Discharge status: Home / Self Care | Payer: Medicare Other | Source: Ambulatory Visit | Attending: Physician Assistant | Admitting: Physician Assistant

## 2018-06-13 DIAGNOSIS — T84032A Mechanical loosening of internal right knee prosthetic joint, initial encounter: Secondary | ICD-10-CM | POA: Diagnosis not present

## 2018-06-13 DIAGNOSIS — Y838 Other surgical procedures as the cause of abnormal reaction of the patient, or of later complication, without mention of misadventure at the time of the procedure: Secondary | ICD-10-CM | POA: Insufficient documentation

## 2018-06-13 MED ORDER — TECHNETIUM TC 99M MEDRONATE IV KIT
21.8000 | PACK | Freq: Once | INTRAVENOUS | Status: AC | PRN
  Administered 2018-06-13: 21.8 via INTRAVENOUS

## 2018-06-22 ENCOUNTER — Telehealth: Payer: Self-pay | Admitting: Internal Medicine

## 2018-06-22 NOTE — Telephone Encounter (Signed)
° °  [facility] Pre-operative Risk Assessment    Request for surgical clearance:  1. What type of surgery is being performed? Right total knee revision  2. When is this surgery scheduled? 07/06/18  3. What type of clearance is required (medical clearance vs. Pharmacy clearance to hold med vs. Both)? Both   4. Are there any medications that need to be held prior to surgery and how long? Eliquis, please advise how long for medication to be held.   5. Practice name and name of physician performing surgery? [facility], Dr. Wynelle Link   6. What is your office phone number [phone] ATTN: Kelly Dickson    7.   What is your office fax number [phone]   8.   Anesthesia type (None, local, MAC, general) ? Choice    Kelly Dickson 06/22/2018, 12:39 PM  _________________________________________________________________   (provider comments below)

## 2018-06-23 ENCOUNTER — Other Ambulatory Visit (HOSPITAL_COMMUNITY): Payer: Self-pay | Admitting: *Deleted

## 2018-06-24 ENCOUNTER — Other Ambulatory Visit (HOSPITAL_COMMUNITY): Payer: Self-pay | Admitting: Internal Medicine

## 2018-06-27 ENCOUNTER — Other Ambulatory Visit (HOSPITAL_COMMUNITY): Payer: Medicare Other

## 2018-06-27 NOTE — Telephone Encounter (Signed)
Pt takes Eliquis for afib with CHADS2VASc score of 6 (age, sex, CHF, HTN, TIA in 2018). SCr 1.04, CrCl 38mL/min using adjusted body weight (BMI of 40). Typically hold DOACs for 3 days prior to major knee surgery since spinal sedation is frequently used, however with history of afib and TIA, pt is also at high risk of recurrent thromboembolic event.  Since pt does not fit our protocol, will route to managing physician to address length of Eliquis interruption prior to knee surgery.

## 2018-06-27 NOTE — Telephone Encounter (Signed)
Pt was last seen by Dr. Gala Romney on 04/29/18. She is on eliquis for PAF. I will forward to pharmacy for guidance on eliquis. She will then need a phone call.

## 2018-06-28 NOTE — Patient Instructions (Addendum)
Kelly Dickson  06/28/2018   Your procedure is scheduled on: 07-06-18   Report to [facility] Main  Entrance    Report to admitting at 12:30PM    Call this number if you have problems the morning of surgery [phone]    PLEASE BRING CPAP MASK AND  TUBING ONLY. DEVICE WILL BE PROVIDED!   Remember: Do not eat food After Midnight. YOU MAY HAVE CLEAR LIQUIDS FROM MIDNIGHT UNTIL 9:00AM. NOTHING BY MOUTH AFTER 9:00AM!    CLEAR LIQUID DIET   Foods Allowed                                                                     Foods Excluded  Coffee and tea, regular and decaf                             liquids that you cannot  Plain Jell-O in any flavor                                             see through such as: Fruit ices (not with fruit pulp)                                     milk, soups, orange juice  Iced Popsicles                                    All solid food Carbonated beverages, regular and diet                                    Cranberry, grape and apple juices Sports drinks like Gatorade Lightly seasoned clear broth or consume(fat free) Sugar, honey syrup  Sample Menu Breakfast                                Lunch                                     Supper Cranberry juice                    Beef broth                            Chicken broth Jell-O                                     Grape juice  Apple juice Coffee or tea                        Jell-O                                      Popsicle                                                Coffee or tea                        Coffee or tea  _____________________________________________________________________       Take these medicines the morning of surgery with A SIP OF WATER: DULOXETINE, CLARITIN, TYLENOL IF NEEDED                                You may not have any metal on your body including hair pins and              piercings  Do not wear  jewelry, make-up, lotions, powders or perfumes, deodorant             Do not wear nail polish.  Do not shave  48 hours prior to surgery.     Do not bring valuables to the hospital. [facility] IS NOT             RESPONSIBLE   FOR VALUABLES.  Contacts, dentures or bridgework may not be worn into surgery.  Leave suitcase in the car. After surgery it may be brought to your room.                Please read over the following fact sheets you were given: _____________________________________________________________________             [facility] - Preparing for Surgery Before surgery, you can play an important role.  Because skin is not sterile, your skin needs to be as free of germs as possible.  You can reduce the number of germs on your skin by washing with CHG (chlorahexidine gluconate) soap before surgery.  CHG is an antiseptic cleaner which kills germs and bonds with the skin to continue killing germs even after washing. Please DO NOT use if you have an allergy to CHG or antibacterial soaps.  If your skin becomes reddened/irritated stop using the CHG and inform your nurse when you arrive at Short Stay. Do not shave (including legs and underarms) for at least 48 hours prior to the first CHG shower.  You may shave your face/neck. Please follow these instructions carefully:  1.  Shower with CHG Soap the night before surgery and the  morning of Surgery.  2.  If you choose to wash your hair, wash your hair first as usual with your  normal  shampoo.  3.  After you shampoo, rinse your hair and body thoroughly to remove the  shampoo.                           4.  Use CHG as you would any other liquid soap.  You can apply chg directly  to the skin and  wash                       Gently with a scrungie or clean washcloth.  5.  Apply the CHG Soap to your body ONLY FROM THE NECK DOWN.   Do not use on face/ open                           Wound or open sores. Avoid contact with eyes, ears mouth and  genitals (private parts).                       Wash face,  Genitals (private parts) with your normal soap.             6.  Wash thoroughly, paying special attention to the area where your surgery  will be performed.  7.  Thoroughly rinse your body with warm water from the neck down.  8.  DO NOT shower/wash with your normal soap after using and rinsing off  the CHG Soap.                9.  Pat yourself dry with a clean towel.            10.  Wear clean pajamas.            11.  Place clean sheets on your bed the night of your first shower and do not  sleep with pets. Day of Surgery : Do not apply any lotions/deodorants the morning of surgery.  Please wear clean clothes to the hospital/surgery center.  FAILURE TO FOLLOW THESE INSTRUCTIONS MAY RESULT IN THE CANCELLATION OF YOUR SURGERY PATIENT SIGNATURE_________________________________  NURSE SIGNATURE__________________________________  ________________________________________________________________________   Kelly Dickson  An incentive spirometer is a tool that can help keep your lungs clear and active. This tool measures how well you are filling your lungs with each breath. Taking long deep breaths may help reverse or decrease the chance of developing breathing (pulmonary) problems (especially infection) following:  A long period of time when you are unable to move or be active. BEFORE THE PROCEDURE   If the spirometer includes an indicator to show your best effort, your nurse or respiratory therapist will set it to a desired goal.  If possible, sit up straight or lean slightly forward. Try not to slouch.  Hold the incentive spirometer in an upright position. INSTRUCTIONS FOR USE  1. Sit on the edge of your bed if possible, or sit up as far as you can in bed or on a chair. 2. Hold the incentive spirometer in an upright position. 3. Breathe out normally. 4. Place the mouthpiece in your mouth and seal your lips tightly around  it. 5. Breathe in slowly and as deeply as possible, raising the piston or the ball toward the top of the column. 6. Hold your breath for 3-5 seconds or for as long as possible. Allow the piston or ball to fall to the bottom of the column. 7. Remove the mouthpiece from your mouth and breathe out normally. 8. Rest for a few seconds and repeat Steps 1 through 7 at least 10 times every 1-2 hours when you are awake. Take your time and take a few normal breaths between deep breaths. 9. The spirometer may include an indicator to show your best effort. Use the indicator as a goal to work toward during each repetition. 10. After each set of 10  deep breaths, practice coughing to be sure your lungs are clear. If you have an incision (the cut made at the time of surgery), support your incision when coughing by placing a pillow or rolled up towels firmly against it. Once you are able to get out of bed, walk around indoors and cough well. You may stop using the incentive spirometer when instructed by your caregiver.  RISKS AND COMPLICATIONS  Take your time so you do not get dizzy or light-headed.  If you are in pain, you may need to take or ask for pain medication before doing incentive spirometry. It is harder to take a deep breath if you are having pain. AFTER USE  Rest and breathe slowly and easily.  It can be helpful to keep track of a log of your progress. Your caregiver can provide you with a simple table to help with this. If you are using the spirometer at home, follow these instructions: [place] IF:   You are having difficultly using the spirometer.  You have trouble using the spirometer as often as instructed.  Your pain medication is not giving enough relief while using the spirometer.  You develop fever of 100.5 F (38.1 C) or higher. SEEK IMMEDIATE MEDICAL CARE IF:   You cough up bloody sputum that had not been present before.  You develop fever of 102 F (38.9 C) or  greater.  You develop worsening pain at or near the incision site. MAKE SURE YOU:   Understand these instructions.  Will watch your condition.  Will get help right away if you are not doing well or get worse. Document Released: 03/01/2007 Document Revised: 01/11/2012 Document Reviewed: 05/02/2007 Exit[employer] Patient Information 2014 [employer], [place].   ________________________________________________________________________  WHAT IS A BLOOD TRANSFUSION? Blood Transfusion Information  A transfusion is the replacement of blood or some of its parts. Blood is made up of multiple cells which provide different functions.  Red blood cells carry oxygen and are used for blood loss replacement.  White blood cells fight against infection.  Platelets control bleeding.  Plasma helps clot blood.  Other blood products are available for specialized needs, such as hemophilia or other clotting disorders. BEFORE THE TRANSFUSION  Who gives blood for transfusions?   Healthy volunteers who are fully evaluated to make sure their blood is safe. This is blood bank blood. Transfusion therapy is the safest it has ever been in the practice of medicine. Before blood is taken from a donor, a complete history is taken to make sure that person has no history of diseases nor engages in risky social behavior (examples are intravenous drug use or sexual activity with multiple partners). The donor's travel history is screened to minimize risk of transmitting infections, such as malaria. The donated blood is tested for signs of infectious diseases, such as HIV and hepatitis. The blood is then tested to be sure it is compatible with you in order to minimize the chance of a transfusion reaction. If you or a relative donates blood, this is often done in anticipation of surgery and is not appropriate for emergency situations. It takes many days to process the donated blood. RISKS AND COMPLICATIONS Although transfusion therapy  is very safe and saves many lives, the main dangers of transfusion include:   Getting an infectious disease.  Developing a transfusion reaction. This is an allergic reaction to something in the blood you were given. Every precaution is taken to prevent this. The decision to have a blood transfusion  has been considered carefully by your caregiver before blood is given. Blood is not given unless the benefits outweigh the risks. AFTER THE TRANSFUSION  Right after receiving a blood transfusion, you will usually feel much better and more energetic. This is especially true if your red blood cells have gotten low (anemic). The transfusion raises the level of the red blood cells which carry oxygen, and this usually causes an energy increase.  The nurse administering the transfusion will monitor you carefully for complications. HOME CARE INSTRUCTIONS  No special instructions are needed after a transfusion. You may find your energy is better. Speak with your caregiver about any limitations on activity for underlying diseases you may have. SEEK MEDICAL CARE IF:   Your condition is not improving after your transfusion.  You develop redness or irritation at the intravenous (IV) site. SEEK IMMEDIATE MEDICAL CARE IF:  Any of the following symptoms occur over the next 12 hours:  Shaking chills.  You have a temperature by mouth above 102 F (38.9 C), not controlled by medicine.  Chest, back, or muscle pain.  People around you feel you are not acting correctly or are confused.  Shortness of breath or difficulty breathing.  Dizziness and fainting.  You get a rash or develop hives.  You have a decrease in urine output.  Your urine turns a dark color or changes to pink, red, or brown. Any of the following symptoms occur over the next 10 days:  You have a temperature by mouth above 102 F (38.9 C), not controlled by medicine.  Shortness of breath.  Weakness after normal activity.  The white  part of the eye turns yellow (jaundice).  You have a decrease in the amount of urine or are urinating less often.  Your urine turns a dark color or changes to pink, red, or brown. Document Released: 10/16/2000 Document Revised: 01/11/2012 Document Reviewed: 06/04/2008 [facility] Patient Information 2014 [place], [place].  _______________________________________________________________________

## 2018-06-28 NOTE — Progress Notes (Signed)
CARDIAC CLEARANCE  06-22-18 ON CHART/ Epic TELEPHONE NOTE   EKG 04-29-18 Epic   ECHO 04-13-18 Epic   CXR 04-12-18 Epic

## 2018-06-28 NOTE — Telephone Encounter (Signed)
   Primary Cardiologist: No primary care provider on file.  Chart reviewed as part of pre-operative protocol coverage. Patient was contacted 06/28/2018 in reference to pre-operative risk assessment for pending surgery as outlined below.  Kelly Dickson was last seen on 04/29/18 by Dr. Gala Romney.  Since that day, Kelly Dickson has done well. She continues to lose weight and is breathing better. She completes her ADLs and can complete 4.0 METS.   Regarding anticoagulation from Dr. Gala Romney:  "Would hold 3 days pre-op then restart immediately after surgery"  Therefore, based on ACC/AHA guidelines, the patient would be at acceptable risk for the planned procedure without further cardiovascular testing.   I will route this recommendation to the requesting party via Epic fax function and remove from pre-op pool.  Please call with questions.  Kelly Rutherford Duke, PA 06/28/2018, 2:30 PM

## 2018-06-28 NOTE — Telephone Encounter (Signed)
Would hold 3 days pre-op then restart immediately after surgery

## 2018-06-29 ENCOUNTER — Other Ambulatory Visit (HOSPITAL_COMMUNITY): Payer: Self-pay | Admitting: Internal Medicine

## 2018-06-30 ENCOUNTER — Encounter (HOSPITAL_COMMUNITY)
Admission: RE | Admit: 2018-06-30 | Discharge: 2018-06-30 | Disposition: A | Discharge status: Home / Self Care | Payer: Medicare Other | Source: Ambulatory Visit | Attending: Orthopedic Surgery | Admitting: Orthopedic Surgery

## 2018-06-30 ENCOUNTER — Encounter (HOSPITAL_COMMUNITY): Payer: Self-pay

## 2018-06-30 ENCOUNTER — Other Ambulatory Visit: Payer: Self-pay

## 2018-06-30 DIAGNOSIS — Z0183 Encounter for blood typing: Secondary | ICD-10-CM | POA: Diagnosis not present

## 2018-06-30 DIAGNOSIS — Z01812 Encounter for preprocedural laboratory examination: Secondary | ICD-10-CM | POA: Diagnosis not present

## 2018-06-30 DIAGNOSIS — T84032A Mechanical loosening of internal right knee prosthetic joint, initial encounter: Secondary | ICD-10-CM | POA: Diagnosis not present

## 2018-06-30 DIAGNOSIS — Y838 Other surgical procedures as the cause of abnormal reaction of the patient, or of later complication, without mention of misadventure at the time of the procedure: Secondary | ICD-10-CM | POA: Diagnosis not present

## 2018-06-30 HISTORY — DX: Obstructive sleep apnea (adult) (pediatric): G47.33

## 2018-06-30 HISTORY — DX: Unspecified kidney failure: N19

## 2018-06-30 HISTORY — DX: Unspecified osteoarthritis, unspecified site: M19.90

## 2018-06-30 LAB — CBC
HEMATOCRIT: 33.8 % — AB (ref 36.0–46.0)
HEMOGLOBIN: 10.2 g/dL — AB (ref 12.0–15.0)
MCH: 26.5 pg (ref 26.0–34.0)
MCHC: 30.2 g/dL (ref 30.0–36.0)
MCV: 87.8 fL (ref 78.0–100.0)
Platelets: 208 10*3/uL (ref 150–400)
RBC: 3.85 MIL/uL — ABNORMAL LOW (ref 3.87–5.11)
RDW: 16.5 % — ABNORMAL HIGH (ref 11.5–15.5)
WBC: 5.7 10*3/uL (ref 4.0–10.5)

## 2018-06-30 LAB — COMPREHENSIVE METABOLIC PANEL
ALBUMIN: 4.2 g/dL (ref 3.5–5.0)
ALT: 19 U/L (ref 0–44)
ANION GAP: 10 (ref 5–15)
AST: 20 U/L (ref 15–41)
Alkaline Phosphatase: 71 U/L (ref 38–126)
BILIRUBIN TOTAL: 0.9 mg/dL (ref 0.3–1.2)
BUN: 30 mg/dL — ABNORMAL HIGH (ref 8–23)
CHLORIDE: 107 mmol/L (ref 98–111)
CO2: 27 mmol/L (ref 22–32)
Calcium: 9.4 mg/dL (ref 8.9–10.3)
Creatinine, Ser: 0.99 mg/dL (ref 0.44–1.00)
GFR calc Af Amer: >60 mL/min (ref >60)
GFR, EST NON AFRICAN AMERICAN: 56 mL/min — AB (ref >60)
Glucose, Bld: 105 mg/dL — ABNORMAL HIGH (ref 70–99)
Potassium: 3.8 mmol/L (ref 3.5–5.1)
Sodium: 144 mmol/L (ref 135–145)
TOTAL PROTEIN: 7.3 g/dL (ref 6.5–8.1)

## 2018-06-30 LAB — SURGICAL PCR SCREEN
MRSA, PCR: POSITIVE — AB
Staphylococcus aureus: POSITIVE — AB

## 2018-06-30 LAB — PROTIME-INR
INR: 1.24
PROTHROMBIN TIME: 15.5 s — AB (ref 11.4–15.2)

## 2018-06-30 LAB — APTT: aPTT: 38 seconds — ABNORMAL HIGH (ref 24–36)

## 2018-06-30 NOTE — Progress Notes (Signed)
CMP. PT.PTT ROUTED VIA Epic TO DR. Lequita Halt

## 2018-07-05 MED ORDER — BUPIVACAINE LIPOSOME 1.3 % IJ SUSP
20.0000 mL | INTRAMUSCULAR | Status: DC
  Filled 2018-07-05: qty 20

## 2018-07-05 MED ORDER — VANCOMYCIN HCL 10 G IV SOLR
1500.0000 mg | INTRAVENOUS | Status: AC
  Administered 2018-07-06: 1500 mg via INTRAVENOUS
  Filled 2018-07-05: qty 1500

## 2018-07-05 NOTE — H&P (Signed)
TOTAL KNEE REVISION ADMISSION H&P  Patient is being admitted for right revision total knee arthroplasty.  Subjective:  Chief Complaint:right knee pain.  HPI: Kelly Dickson, 72 y.o. female, has a history of pain and functional disability in the right knee(s) due to loose right total knee arthroplasty and patient has failed non-surgical conservative treatments for greater than 12 weeks to include NSAID's and/or analgesics and physical therapy. The indications for the revision of the total knee arthroplasty are loosening of one or more components. Onset of symptoms was abrupt starting less than one year ago with stable course since that time.  Prior procedures on the right knee(s) include arthroplasty.  Patient currently rates pain in the right knee(s) at 10 out of 10 with activity. There is pain that interferes with activities of daily living and instability.  Patient has evidence of shifting of the tibial component into varus by imaging studies. This condition presents safety issues increasing the risk of falls. There is no current active infection.  Patient Active Problem List   Diagnosis Date Noted  . Acute metabolic encephalopathy 04/13/2018  . Acute kidney injury (HCC)   . Hyperkalemia   . Chronic systolic CHF (congestive heart failure) (HCC)   . AKI (acute kidney injury) (HCC) 04/12/2018  . TIA (transient ischemic attack) 04/27/2017  . Bradycardia 01/09/2016  . PAF (paroxysmal atrial fibrillation) (HCC)   . LBBB (left bundle branch block) 12/22/2015  . Leg swelling 11/25/2015  . Cellulitis of leg, left 11/25/2015  . Panic attack   . CHF (congestive heart failure) (HCC)   . Essential hypertension   . MR (mitral regurgitation)   . TR (tricuspid regurgitation)   . Pulmonary hypertension (HCC)   . HLD (hyperlipidemia)    Past Medical History:  Diagnosis Date  . A-fib (HCC)   . Arthritis   . CHF (congestive heart failure) (HCC)   . Essential hypertension   . HLD  (hyperlipidemia)   . Kidney failure    D/T bACTRIM   . MR (mitral regurgitation)   . OSA (obstructive sleep apnea)    WITH NASAL PRONG   . Panic attack   . Pulmonary hypertension (HCC)   . TR (tricuspid regurgitation)     Past Surgical History:  Procedure Laterality Date  . CARDIOVERSION N/A 12/25/2015   Procedure: CARDIOVERSION;  Surgeon: Dolores Patty, MD;  Location[facility] ENDOSCOPY;  Service: Cardiovascular;  Laterality: N/A;  . CARDIOVERSION N/A 01/04/2018   Procedure: CARDIOVERSION;  Surgeon: Dolores Patty, MD;  Location: [facility] ENDOSCOPY;  Service: Cardiovascular;  Laterality: N/A;  . HERNIA REPAIR    . REPLACEMENT TOTAL KNEE Right 2009  . TONSILLECTOMY    . TOTAL KNEE ARTHROPLASTY Left     No current facility-administered medications for this encounter.    Current Outpatient Medications  Medication Sig Dispense Refill Last Dose  . acetaminophen (TYLENOL) 650 MG CR tablet Take 1,300 mg by mouth every 8 (eight) hours as needed for pain.    Taking  . aspirin 81 MG chewable tablet Chew 81 mg by mouth daily.   Taking  . Bioflavonoid Products (VITAMIN C/BIOFLAVONOIDS) 1000-25 MG TABS Take 1 tablet by mouth every other day.    Taking  . Calcium Carbonate-Vitamin D (CALCIUM-VITAMIN D) 500-200 MG-UNIT tablet Take 1 tablet by mouth every other day.    Taking  . Coenzyme Q10 (COQ10) 100 MG CAPS Take 100 mg by mouth every other day.    Taking  . DULoxetine (CYMBALTA) 30 MG capsule Take 60  mg by mouth daily.    Taking  . ELIQUIS 5 MG TABS tablet TAKE 1 TABLET BY MOUTH 2 TIMES A DAY 60 tablet 6 Taking  . furosemide (LASIX) 40 MG tablet Take 40 mg tab, twice a day until weight is at 232 lbs, then decrease to 40 mg daily. If wt is or above 235 take extra 40 mg tab. (Patient taking differently: Take 40 mg by mouth daily. Take 40 mg tab, twice a day until weight is at 232 lbs, then decrease to 40 mg daily. If wt is or above 235 take extra 40 mg tab.) 60 tablet 0   . gabapentin (NEURONTIN) 300  MG capsule Take 1 capsule (300 mg total) by mouth 2 (two) times daily. (Patient taking differently: Take 300 mg by mouth 3 (three) times daily. ) 60 capsule 0 Taking  . lactobacillus acidophilus & bulgar (LACTINEX) chewable tablet Chew 3 tablets by mouth every other day.    Taking  . LECITHIN PO Take 2 tablets by mouth every other day.    Taking  . Liniments (SALONPAS ARTHRITIS PAIN RELIEF EX) Place 1 patch onto the skin daily as needed (pain).   Taking  . loratadine (CLARITIN) 10 MG tablet Take 10 mg by mouth daily as needed for allergies.    Taking  . Menthol, Topical Analgesic, (BIOFREEZE EX) Apply 1 application topically 2 (two) times daily as needed (pain).   Taking  . Multiple Vitamins-Minerals (MULTIVITAMIN WITH MINERALS) tablet Take 1 tablet by mouth every other day.    Taking  . Omega-3 Fatty Acids (FISH OIL) 1000 MG CAPS Take 1,000 mg by mouth every other day.    Taking  . Red Yeast Rice 600 MG CAPS Take 600 mg by mouth every other day.    Taking  . rosuvastatin (CRESTOR) 40 MG tablet Take 1 tablet (40 mg total) by mouth daily at 6 PM. 30 tablet 0 Taking  . sacubitril-valsartan (ENTRESTO) 97-103 MG Take 1 tablet by mouth 2 (two) times daily. 60 tablet 3   . senna (SENOKOT) 8.6 MG TABS tablet Take 1 tablet (8.6 mg total) by mouth 2 (two) times daily. (Patient taking differently: Take 1 tablet by mouth daily as needed for moderate constipation. ) 120 each 0 Taking  . vitamin E 400 UNIT capsule Take 400 Units by mouth every other day.    Taking   Allergies  Allergen Reactions  . Sulfamethoxazole-Trimethoprim Other (See Comments)    Kidney failure    Social History   Tobacco Use  . Smoking status: Never Smoker  . Smokeless tobacco: Never Used  Substance Use Topics  . Alcohol use: No    Family History  Problem Relation Age of Onset  . Breast cancer Mother   . Hypertension Sister       Review of Systems  Constitutional: Negative for chills and fever.  HENT: Negative for  congestion, sore throat and tinnitus.   Eyes: Negative for double vision, photophobia and pain.  Respiratory: Negative for cough, shortness of breath and wheezing.   Cardiovascular: Negative for chest pain, palpitations and orthopnea.  Gastrointestinal: Negative for heartburn, nausea and vomiting.  Genitourinary: Negative for dysuria, frequency and urgency.  Musculoskeletal: Positive for joint pain.  Neurological: Negative for dizziness, weakness and headaches.  Psychiatric/Behavioral: Negative for depression.     Objective:  Physical Exam Well nourished and well developed. General: Alert and oriented x3, cooperative and pleasant, no acute distress. Head: normocephalic, atraumatic, neck supple. Eyes: EOMI. Respiratory: breath sounds  clear in all fields, no wheezing, rales, or rhonchi. Cardiovascular: Regular rate and rhythm, no murmurs, gallops or rubs.  Abdomen: non-tender to palpation and soft, normoactive bowel sounds. Musculoskeletal Right knee exam: No swelling. Small effusion.Varus deformity. Range of motion: 5 - 110 degrees.Knee has pseudo-instability where she is in a slight varus but I can correct her to neutral. There is no gross laxity noted.  Calves soft and nontender. Motor function intact in LE. Strength 5/5 LE bilaterally. Neuro: Distal pulses 2+. Sensation to light touch intact in LE.  Vital signs in last 24 hours:  Blood pressure: 132/76 mmHg Pulse: 84 bpm  Labs:  Estimated body mass index is 41.98 kg/m as calculated from the following:   Height as of 06/30/18: 5\' 3"  (1.6 m).   Weight as of 04/29/18: 107.5 kg.  Imaging Review Plain radiographs of the right knee demonstrate shifting of the tibial component into varus, it is completely loose. The femoral component appears to be fine. The bone quality appears to be adequate for age and reported activity level.   Preoperative templating of the joint replacement has been completed, documented, and submitted to the  Operating Room personnel in order to optimize intra-operative equipment management.   Assessment/Plan:  End stage arthritis, right knee(s) with failed previous arthroplasty.   The patient history, physical examination, clinical judgment of the provider and imaging studies are consistent with end stage degenerative joint disease of the right knee(s), previous total knee arthroplasty. Revision total knee arthroplasty is deemed medically necessary. The treatment options including medical management, injection therapy, arthroscopy and revision arthroplasty were discussed at length. The risks and benefits of revision total knee arthroplasty were presented and reviewed. The risks due to aseptic loosening, infection, stiffness, patella tracking problems, thromboembolic complications and other imponderables were discussed. The patient acknowledged the explanation, agreed to proceed with the plan and consent was signed. Patient is being admitted for inpatient treatment for surgery, pain control, PT, OT, prophylactic antibiotics, VTE prophylaxis, progressive ambulation and ADL's and discharge planning.The patient is planning to be discharged to a skilled nursing facility.   Therapy Plans: SNF Disposition: SNF Planned DVT Prophylaxis: Eliquis (pt has atrial fibrillation) DME needed: None PCP: Dr. Christianne Dolin, PA-C Cardiologist: Dr. Gala Romney (cardiac clearance provided) TXA: IV Allergies: Bactrim  - Patient was instructed on what medications to stop prior to surgery. - Follow-up visit in 2 weeks with Dr. Lequita Halt - Begin physical therapy following surgery - Pre-operative lab work as pre-surgical testing - Prescriptions will be provided in hospital at time of discharge  Arther Abbott, PA-C Orthopedic Surgery [facility] [place]

## 2018-07-06 ENCOUNTER — Inpatient Hospital Stay (HOSPITAL_COMMUNITY)
Admission: RE | Admit: 2018-07-06 | Discharge: 2018-07-08 | DRG: 467 | Disposition: A | Discharge status: Skilled Nursing Facility | Payer: Medicare Other | Source: Ambulatory Visit | Attending: Orthopedic Surgery | Admitting: Orthopedic Surgery

## 2018-07-06 ENCOUNTER — Encounter (HOSPITAL_COMMUNITY): Payer: Self-pay | Admitting: Emergency Medicine

## 2018-07-06 ENCOUNTER — Encounter (HOSPITAL_COMMUNITY)
Admission: RE | Disposition: A | Discharge status: Skilled Nursing Facility | Payer: Self-pay | Source: Ambulatory Visit | Attending: Orthopedic Surgery

## 2018-07-06 ENCOUNTER — Inpatient Hospital Stay (HOSPITAL_COMMUNITY): Payer: Medicare Other | Admitting: Anesthesiology

## 2018-07-06 DIAGNOSIS — T84018A Broken internal joint prosthesis, other site, initial encounter: Secondary | ICD-10-CM

## 2018-07-06 DIAGNOSIS — I5022 Chronic systolic (congestive) heart failure: Secondary | ICD-10-CM | POA: Diagnosis present

## 2018-07-06 DIAGNOSIS — Z6841 Body Mass Index (BMI) 40.0 and over, adult: Secondary | ICD-10-CM

## 2018-07-06 DIAGNOSIS — E785 Hyperlipidemia, unspecified: Secondary | ICD-10-CM | POA: Diagnosis present

## 2018-07-06 DIAGNOSIS — Z7901 Long term (current) use of anticoagulants: Secondary | ICD-10-CM | POA: Diagnosis not present

## 2018-07-06 DIAGNOSIS — Z8249 Family history of ischemic heart disease and other diseases of the circulatory system: Secondary | ICD-10-CM

## 2018-07-06 DIAGNOSIS — I447 Left bundle-branch block, unspecified: Secondary | ICD-10-CM | POA: Diagnosis present

## 2018-07-06 DIAGNOSIS — T84032A Mechanical loosening of internal right knee prosthetic joint, initial encounter: Principal | ICD-10-CM | POA: Diagnosis present

## 2018-07-06 DIAGNOSIS — Z96659 Presence of unspecified artificial knee joint: Secondary | ICD-10-CM

## 2018-07-06 DIAGNOSIS — G4733 Obstructive sleep apnea (adult) (pediatric): Secondary | ICD-10-CM | POA: Diagnosis present

## 2018-07-06 DIAGNOSIS — Z803 Family history of malignant neoplasm of breast: Secondary | ICD-10-CM

## 2018-07-06 DIAGNOSIS — M1711 Unilateral primary osteoarthritis, right knee: Secondary | ICD-10-CM | POA: Diagnosis present

## 2018-07-06 DIAGNOSIS — I480 Paroxysmal atrial fibrillation: Secondary | ICD-10-CM | POA: Diagnosis present

## 2018-07-06 DIAGNOSIS — I110 Hypertensive heart disease with heart failure: Secondary | ICD-10-CM | POA: Diagnosis present

## 2018-07-06 DIAGNOSIS — Z7982 Long term (current) use of aspirin: Secondary | ICD-10-CM | POA: Diagnosis not present

## 2018-07-06 DIAGNOSIS — F410 Panic disorder [episodic paroxysmal anxiety] without agoraphobia: Secondary | ICD-10-CM | POA: Diagnosis present

## 2018-07-06 DIAGNOSIS — I272 Pulmonary hypertension, unspecified: Secondary | ICD-10-CM | POA: Diagnosis present

## 2018-07-06 DIAGNOSIS — Z8673 Personal history of transient ischemic attack (TIA), and cerebral infarction without residual deficits: Secondary | ICD-10-CM

## 2018-07-06 DIAGNOSIS — I081 Rheumatic disorders of both mitral and tricuspid valves: Secondary | ICD-10-CM | POA: Diagnosis present

## 2018-07-06 DIAGNOSIS — Y792 Prosthetic and other implants, materials and accessory orthopedic devices associated with adverse incidents: Secondary | ICD-10-CM | POA: Diagnosis present

## 2018-07-06 HISTORY — PX: TOTAL KNEE REVISION: SHX996

## 2018-07-06 LAB — TYPE AND SCREEN
ABO/RH(D): B POS
ANTIBODY SCREEN: NEGATIVE

## 2018-07-06 SURGERY — TOTAL KNEE REVISION
Anesthesia: Spinal | Site: Knee | Laterality: Right

## 2018-07-06 MED ORDER — ONDANSETRON HCL 4 MG/2ML IJ SOLN: 4.0000 mg | Freq: Once | INTRAMUSCULAR | Status: DC | PRN

## 2018-07-06 MED ORDER — PROPOFOL 10 MG/ML IV BOLUS
INTRAVENOUS | Status: AC
  Filled 2018-07-06: qty 20

## 2018-07-06 MED ORDER — MENTHOL 3 MG MT LOZG: 1.0000 | LOZENGE | OROMUCOSAL | Status: DC | PRN

## 2018-07-06 MED ORDER — HYDROMORPHONE HCL 1 MG/ML IJ SOLN
0.5000 mg | INTRAMUSCULAR | Status: DC | PRN
  Administered 2018-07-07: 1 mg via INTRAVENOUS
  Filled 2018-07-06: qty 1

## 2018-07-06 MED ORDER — ROCURONIUM BROMIDE 10 MG/ML (PF) SYRINGE
PREFILLED_SYRINGE | INTRAVENOUS | Status: AC
  Filled 2018-07-06: qty 20

## 2018-07-06 MED ORDER — FENTANYL CITRATE (PF) 100 MCG/2ML IJ SOLN
INTRAMUSCULAR | Status: AC
  Filled 2018-07-06: qty 2

## 2018-07-06 MED ORDER — POLYETHYLENE GLYCOL 3350 17 G PO PACK: 17.0000 g | PACK | Freq: Every day | ORAL | Status: DC | PRN

## 2018-07-06 MED ORDER — GABAPENTIN 300 MG PO CAPS
300.0000 mg | ORAL_CAPSULE | Freq: Once | ORAL | Status: AC
  Administered 2018-07-06: 300 mg via ORAL
  Filled 2018-07-06: qty 1

## 2018-07-06 MED ORDER — DEXAMETHASONE SODIUM PHOSPHATE 10 MG/ML IJ SOLN
INTRAMUSCULAR | Status: AC
  Filled 2018-07-06: qty 1

## 2018-07-06 MED ORDER — SODIUM CHLORIDE 0.9 % IV SOLN
INTRAVENOUS | Status: DC
  Administered 2018-07-06: 21:00:00 via INTRAVENOUS

## 2018-07-06 MED ORDER — FUROSEMIDE 40 MG PO TABS
40.0000 mg | ORAL_TABLET | Freq: Every day | ORAL | Status: DC
  Administered 2018-07-07 – 2018-07-08 (×2): 40 mg via ORAL
  Filled 2018-07-06 (×2): qty 1

## 2018-07-06 MED ORDER — DIPHENHYDRAMINE HCL 12.5 MG/5ML PO ELIX: 12.5000 mg | ORAL_SOLUTION | ORAL | Status: DC | PRN

## 2018-07-06 MED ORDER — ROPIVACAINE HCL 7.5 MG/ML IJ SOLN
INTRAMUSCULAR | Status: DC | PRN
  Administered 2018-07-06: 20 mL via PERINEURAL

## 2018-07-06 MED ORDER — CEFAZOLIN SODIUM-DEXTROSE 2-4 GM/100ML-% IV SOLN
2.0000 g | INTRAVENOUS | Status: AC
  Administered 2018-07-06: 2 g via INTRAVENOUS
  Filled 2018-07-06: qty 100

## 2018-07-06 MED ORDER — SODIUM CHLORIDE 0.9 % IJ SOLN
INTRAMUSCULAR | Status: AC
  Filled 2018-07-06: qty 10

## 2018-07-06 MED ORDER — STERILE WATER FOR IRRIGATION IR SOLN
Status: DC | PRN
  Administered 2018-07-06: 2000 mL

## 2018-07-06 MED ORDER — SODIUM CHLORIDE 0.9 % IJ SOLN
INTRAMUSCULAR | Status: DC | PRN
  Administered 2018-07-06: 60 mL

## 2018-07-06 MED ORDER — LIDOCAINE 2% (20 MG/ML) 5 ML SYRINGE
INTRAMUSCULAR | Status: AC
  Filled 2018-07-06: qty 5

## 2018-07-06 MED ORDER — METHOCARBAMOL 500 MG IVPB - SIMPLE MED
500.0000 mg | Freq: Four times a day (QID) | INTRAVENOUS | Status: DC | PRN
  Filled 2018-07-06: qty 50

## 2018-07-06 MED ORDER — ONDANSETRON HCL 4 MG/2ML IJ SOLN
INTRAMUSCULAR | Status: DC | PRN
  Administered 2018-07-06: 4 mg via INTRAVENOUS

## 2018-07-06 MED ORDER — ACETAMINOPHEN 10 MG/ML IV SOLN
1000.0000 mg | Freq: Four times a day (QID) | INTRAVENOUS | Status: DC
  Administered 2018-07-06: 1000 mg via INTRAVENOUS
  Filled 2018-07-06: qty 100

## 2018-07-06 MED ORDER — DEXAMETHASONE SODIUM PHOSPHATE 10 MG/ML IJ SOLN: 8.0000 mg | Freq: Once | INTRAMUSCULAR | Status: DC

## 2018-07-06 MED ORDER — SACUBITRIL-VALSARTAN 97-103 MG PO TABS
1.0000 | ORAL_TABLET | Freq: Two times a day (BID) | ORAL | Status: DC
  Administered 2018-07-07 – 2018-07-08 (×3): 1 via ORAL
  Filled 2018-07-06 (×4): qty 1

## 2018-07-06 MED ORDER — LACTATED RINGERS IV SOLN
INTRAVENOUS | Status: DC
  Administered 2018-07-06 (×2): via INTRAVENOUS

## 2018-07-06 MED ORDER — OXYCODONE HCL 5 MG PO TABS
5.0000 mg | ORAL_TABLET | ORAL | Status: DC | PRN
  Administered 2018-07-07: 5 mg via ORAL
  Administered 2018-07-07: 10 mg via ORAL
  Filled 2018-07-06: qty 2

## 2018-07-06 MED ORDER — PROPOFOL 500 MG/50ML IV EMUL
INTRAVENOUS | Status: DC | PRN
  Administered 2018-07-06: 100 ug/kg/min via INTRAVENOUS

## 2018-07-06 MED ORDER — TRANEXAMIC ACID 1000 MG/10ML IV SOLN
1000.0000 mg | INTRAVENOUS | Status: AC
  Administered 2018-07-06: 1000 mg via INTRAVENOUS
  Filled 2018-07-06: qty 10

## 2018-07-06 MED ORDER — MIDAZOLAM HCL 2 MG/2ML IJ SOLN
1.0000 mg | INTRAMUSCULAR | Status: AC
  Administered 2018-07-06 (×2): 1 mg via INTRAVENOUS
  Filled 2018-07-06: qty 2

## 2018-07-06 MED ORDER — POLYVINYL ALCOHOL 1.4 % OP SOLN
1.0000 [drp] | OPHTHALMIC | Status: AC | PRN
  Administered 2018-07-06: 1 [drp] via OPHTHALMIC
  Filled 2018-07-06: qty 15

## 2018-07-06 MED ORDER — METOCLOPRAMIDE HCL 5 MG/ML IJ SOLN: 5.0000 mg | Freq: Three times a day (TID) | INTRAMUSCULAR | Status: DC | PRN

## 2018-07-06 MED ORDER — OXYCODONE HCL 5 MG PO TABS
10.0000 mg | ORAL_TABLET | ORAL | Status: DC | PRN
  Administered 2018-07-06 – 2018-07-07 (×3): 15 mg via ORAL
  Administered 2018-07-07: 10 mg via ORAL
  Administered 2018-07-08 (×3): 15 mg via ORAL
  Filled 2018-07-06 (×7): qty 3

## 2018-07-06 MED ORDER — SODIUM CHLORIDE 0.9 % IJ SOLN
INTRAMUSCULAR | Status: AC
  Filled 2018-07-06: qty 50

## 2018-07-06 MED ORDER — BISACODYL 10 MG RE SUPP: 10.0000 mg | Freq: Every day | RECTAL | Status: DC | PRN

## 2018-07-06 MED ORDER — FENTANYL CITRATE (PF) 100 MCG/2ML IJ SOLN
50.0000 ug | INTRAMUSCULAR | Status: AC
  Administered 2018-07-06: 50 ug via INTRAVENOUS
  Filled 2018-07-06: qty 2

## 2018-07-06 MED ORDER — DULOXETINE HCL 60 MG PO CPEP
60.0000 mg | ORAL_CAPSULE | Freq: Every day | ORAL | Status: DC
  Administered 2018-07-07 – 2018-07-08 (×2): 60 mg via ORAL
  Filled 2018-07-06 (×2): qty 1

## 2018-07-06 MED ORDER — DOCUSATE SODIUM 100 MG PO CAPS
100.0000 mg | ORAL_CAPSULE | Freq: Two times a day (BID) | ORAL | Status: DC
  Administered 2018-07-06 – 2018-07-08 (×4): 100 mg via ORAL
  Filled 2018-07-06 (×4): qty 1

## 2018-07-06 MED ORDER — PHENYLEPHRINE 40 MCG/ML (10ML) SYRINGE FOR IV PUSH (FOR BLOOD PRESSURE SUPPORT)
PREFILLED_SYRINGE | INTRAVENOUS | Status: AC
  Filled 2018-07-06: qty 10

## 2018-07-06 MED ORDER — DEXAMETHASONE SODIUM PHOSPHATE 10 MG/ML IJ SOLN
INTRAMUSCULAR | Status: AC
  Filled 2018-07-06: qty 2

## 2018-07-06 MED ORDER — ONDANSETRON HCL 4 MG/2ML IJ SOLN
INTRAMUSCULAR | Status: AC
  Filled 2018-07-06: qty 2

## 2018-07-06 MED ORDER — GABAPENTIN 300 MG PO CAPS
300.0000 mg | ORAL_CAPSULE | Freq: Three times a day (TID) | ORAL | Status: DC
  Administered 2018-07-06 – 2018-07-08 (×6): 300 mg via ORAL
  Filled 2018-07-06 (×6): qty 1

## 2018-07-06 MED ORDER — DEXAMETHASONE SODIUM PHOSPHATE 10 MG/ML IJ SOLN
10.0000 mg | Freq: Once | INTRAMUSCULAR | Status: AC
  Administered 2018-07-07: 10 mg via INTRAVENOUS
  Filled 2018-07-06: qty 1

## 2018-07-06 MED ORDER — PHENOL 1.4 % MT LIQD
1.0000 | OROMUCOSAL | Status: DC | PRN
  Filled 2018-07-06: qty 177

## 2018-07-06 MED ORDER — 0.9 % SODIUM CHLORIDE (POUR BTL) OPTIME
TOPICAL | Status: DC | PRN
  Administered 2018-07-06: 1000 mL

## 2018-07-06 MED ORDER — ONDANSETRON HCL 4 MG/2ML IJ SOLN: 4.0000 mg | Freq: Four times a day (QID) | INTRAMUSCULAR | Status: DC | PRN

## 2018-07-06 MED ORDER — APIXABAN 2.5 MG PO TABS
2.5000 mg | ORAL_TABLET | Freq: Two times a day (BID) | ORAL | Status: DC
  Administered 2018-07-07 – 2018-07-08 (×3): 2.5 mg via ORAL
  Filled 2018-07-06 (×3): qty 1

## 2018-07-06 MED ORDER — ROSUVASTATIN CALCIUM 40 MG PO TABS
40.0000 mg | ORAL_TABLET | Freq: Every day | ORAL | Status: DC
  Administered 2018-07-07: 40 mg via ORAL
  Filled 2018-07-06: qty 2
  Filled 2018-07-06 (×2): qty 1

## 2018-07-06 MED ORDER — CHLORHEXIDINE GLUCONATE 4 % EX LIQD: 60.0000 mL | Freq: Once | CUTANEOUS | Status: DC

## 2018-07-06 MED ORDER — ONDANSETRON HCL 4 MG/2ML IJ SOLN
INTRAMUSCULAR | Status: AC
  Filled 2018-07-06: qty 4

## 2018-07-06 MED ORDER — ONDANSETRON HCL 4 MG PO TABS: 4.0000 mg | ORAL_TABLET | Freq: Four times a day (QID) | ORAL | Status: DC | PRN

## 2018-07-06 MED ORDER — BUPIVACAINE LIPOSOME 1.3 % IJ SUSP
INTRAMUSCULAR | Status: DC | PRN
  Administered 2018-07-06: 20 mL

## 2018-07-06 MED ORDER — DEXAMETHASONE SODIUM PHOSPHATE 10 MG/ML IJ SOLN
INTRAMUSCULAR | Status: DC | PRN
  Administered 2018-07-06: 10 mg via INTRAVENOUS

## 2018-07-06 MED ORDER — METHOCARBAMOL 500 MG PO TABS
500.0000 mg | ORAL_TABLET | Freq: Four times a day (QID) | ORAL | Status: DC | PRN
  Administered 2018-07-08 (×2): 500 mg via ORAL
  Filled 2018-07-06 (×2): qty 1

## 2018-07-06 MED ORDER — FLEET ENEMA 7-19 GM/118ML RE ENEM: 1.0000 | ENEMA | Freq: Once | RECTAL | Status: DC | PRN

## 2018-07-06 MED ORDER — LORATADINE 10 MG PO TABS: 10.0000 mg | ORAL_TABLET | Freq: Every day | ORAL | Status: DC | PRN

## 2018-07-06 MED ORDER — FENTANYL CITRATE (PF) 100 MCG/2ML IJ SOLN
INTRAMUSCULAR | Status: DC | PRN
  Administered 2018-07-06: 25 ug via INTRAVENOUS

## 2018-07-06 MED ORDER — CEFAZOLIN SODIUM-DEXTROSE 2-4 GM/100ML-% IV SOLN
2.0000 g | Freq: Four times a day (QID) | INTRAVENOUS | Status: AC
  Administered 2018-07-06 – 2018-07-07 (×2): 2 g via INTRAVENOUS
  Filled 2018-07-06 (×2): qty 100

## 2018-07-06 MED ORDER — ACETAMINOPHEN 500 MG PO TABS
1000.0000 mg | ORAL_TABLET | Freq: Four times a day (QID) | ORAL | Status: AC
  Administered 2018-07-06 – 2018-07-07 (×4): 1000 mg via ORAL
  Filled 2018-07-06 (×4): qty 2

## 2018-07-06 MED ORDER — FENTANYL CITRATE (PF) 100 MCG/2ML IJ SOLN: 25.0000 ug | INTRAMUSCULAR | Status: DC | PRN

## 2018-07-06 MED ORDER — METOCLOPRAMIDE HCL 5 MG PO TABS: 5.0000 mg | ORAL_TABLET | Freq: Three times a day (TID) | ORAL | Status: DC | PRN

## 2018-07-06 MED ORDER — BUPIVACAINE IN DEXTROSE 0.75-8.25 % IT SOLN
INTRATHECAL | Status: DC | PRN
  Administered 2018-07-06: 15 mg via INTRATHECAL

## 2018-07-06 SURGICAL SUPPLY — 74 items
ADAPTER BOLT FEMORAL +2/-2 (Knees) ×3 IMPLANT
AUGMENT DIST PFC 8MM SZ2 RT (Knees) ×1 IMPLANT
BAG DECANTER FOR FLEXI CONT (MISCELLANEOUS) ×3 IMPLANT
BAG ZIPLOCK 12X15 (MISCELLANEOUS) IMPLANT
BANDAGE ACE 6X5 VEL STRL LF (GAUZE/BANDAGES/DRESSINGS) ×3 IMPLANT
BLADE SAG 18X100X1.27 (BLADE) ×3 IMPLANT
BLADE SAW SGTL 11.0X1.19X90.0M (BLADE) ×3 IMPLANT
BONE CEMENT GENTAMICIN (Cement) ×9 IMPLANT
CEMENT BONE GENTAMICIN 40 (Cement) ×3 IMPLANT
CEMENT RESTRICTOR DEPUY SZ 4 (Cement) ×3 IMPLANT
CLOSURE WOUND 1/2 X4 (GAUZE/BANDAGES/DRESSINGS) ×1
CLOTH BEACON ORANGE TIMEOUT ST (SAFETY) ×3 IMPLANT
COMP FEM CEM RT SZ3 (Orthopedic Implant) ×3 IMPLANT
COMPONENT FEM CEM RT SZ3 (Orthopedic Implant) ×1 IMPLANT
COVER SURGICAL LIGHT HANDLE (MISCELLANEOUS) ×3 IMPLANT
CUFF TOURN SGL QUICK 34 (TOURNIQUET CUFF)
CUFF TOURN SGL QUICK 44 (TOURNIQUET CUFF) ×3 IMPLANT
CUFF TRNQT CYL 34X4X40X1 (TOURNIQUET CUFF) IMPLANT
DECANTER SPIKE VIAL GLASS SM (MISCELLANEOUS) IMPLANT
DISAL AUG PFC 8MM SZ2 RT (Knees) ×3 IMPLANT
DISTAL WEDGE PFC 4MM RIGHT (Knees) ×3 IMPLANT
DRAPE U-SHAPE 47X51 STRL (DRAPES) ×3 IMPLANT
DRSG ADAPTIC 3X8 NADH LF (GAUZE/BANDAGES/DRESSINGS) ×3 IMPLANT
DRSG PAD ABDOMINAL 8X10 ST (GAUZE/BANDAGES/DRESSINGS) ×3 IMPLANT
DURAPREP 26ML APPLICATOR (WOUND CARE) ×3 IMPLANT
ELECT REM PT RETURN 15FT ADLT (MISCELLANEOUS) ×3 IMPLANT
EVACUATOR 1/8 PVC DRAIN (DRAIN) ×3 IMPLANT
FEMORAL ADAPTER (Orthopedic Implant) ×3 IMPLANT
GAUZE SPONGE 4X4 12PLY STRL (GAUZE/BANDAGES/DRESSINGS) ×3 IMPLANT
GAUZE XEROFORM 5X9 LF (GAUZE/BANDAGES/DRESSINGS) ×3 IMPLANT
GLOVE BIO SURGEON STRL SZ7 (GLOVE) ×3 IMPLANT
GLOVE BIO SURGEON STRL SZ8 (GLOVE) ×3 IMPLANT
GLOVE BIOGEL PI IND STRL 7.0 (GLOVE) ×1 IMPLANT
GLOVE BIOGEL PI IND STRL 8 (GLOVE) ×1 IMPLANT
GLOVE BIOGEL PI INDICATOR 7.0 (GLOVE) ×2
GLOVE BIOGEL PI INDICATOR 8 (GLOVE) ×2
GOWN STRL REUS W/TWL LRG LVL3 (GOWN DISPOSABLE) ×3 IMPLANT
GOWN STRL REUS W/TWL XL LVL3 (GOWN DISPOSABLE) ×3 IMPLANT
HANDPIECE INTERPULSE COAX TIP (DISPOSABLE) ×2
HOLDER FOLEY CATH W/STRAP (MISCELLANEOUS) IMPLANT
IMMOBILIZER KNEE 20 (SOFTGOODS) ×6 IMPLANT
IMMOBILIZER KNEE 20 THIGH 36 (SOFTGOODS) ×1 IMPLANT
INSERT TC3 TIBIAL SZ3 20.0 (Knees) ×3 IMPLANT
MANIFOLD NEPTUNE II (INSTRUMENTS) ×3 IMPLANT
NS IRRIG 1000ML POUR BTL (IV SOLUTION) ×3 IMPLANT
PACK TOTAL KNEE CUSTOM (KITS) ×3 IMPLANT
PAD ABD 8X10 STRL (GAUZE/BANDAGES/DRESSINGS) ×3 IMPLANT
PADDING CAST COTTON 6X4 STRL (CAST SUPPLIES) ×6 IMPLANT
PIN STEINMAN FIXATION KNEE (PIN) ×3 IMPLANT
POSITIONER SURGICAL ARM (MISCELLANEOUS) ×3 IMPLANT
POST AVE PFC 4MM (Knees) ×3 IMPLANT
POST AVE PFC 8MM (Knees) ×3 IMPLANT
SET HNDPC FAN SPRY TIP SCT (DISPOSABLE) ×1 IMPLANT
STEM TIBIA PFC 13X30MM (Stem) ×3 IMPLANT
STEM UNIVERSAL REVISION 75X18 (Stem) ×3 IMPLANT
STRIP CLOSURE SKIN 1/2X4 (GAUZE/BANDAGES/DRESSINGS) ×2 IMPLANT
SUT MNCRL AB 4-0 PS2 18 (SUTURE) ×3 IMPLANT
SUT STRATAFIX 0 PDS 27 VIOLET (SUTURE) ×3
SUT VIC AB 2-0 CT1 27 (SUTURE) ×6
SUT VIC AB 2-0 CT1 TAPERPNT 27 (SUTURE) ×3 IMPLANT
SUTURE STRATFX 0 PDS 27 VIOLET (SUTURE) ×1 IMPLANT
SWAB COLLECTION DEVICE MRSA (MISCELLANEOUS) IMPLANT
SWAB CULTURE ESWAB REG 1ML (MISCELLANEOUS) IMPLANT
SYR 50ML LL SCALE MARK (SYRINGE) ×6 IMPLANT
TOWER CARTRIDGE SMART MIX (DISPOSABLE) ×3 IMPLANT
TRAY FOLEY MTR SLVR 16FR STAT (SET/KITS/TRAYS/PACK) ×3 IMPLANT
TRAY REVISION SZ 3 (Knees) ×3 IMPLANT
TRAY SLEEVE CEM ML (Knees) ×3 IMPLANT
TUBE KAMVAC SUCTION (TUBING) IMPLANT
WATER STERILE IRR 1000ML POUR (IV SOLUTION) ×3 IMPLANT
WEDGE DISTAL PFC RIGHT 4MM (Knees) ×1 IMPLANT
WEDGE MBT STEP 2.5 15 MM (Knees) ×3 IMPLANT
WEDGE STEP SZ.5 5MM (Knees) ×3 IMPLANT
WRAP KNEE MAXI GEL POST OP (GAUZE/BANDAGES/DRESSINGS) ×3 IMPLANT

## 2018-07-06 NOTE — Anesthesia Preprocedure Evaluation (Addendum)
Anesthesia Evaluation  Patient identified by MRN, date of birth, ID band Patient awake    Reviewed: Allergy & Precautions, NPO status , Patient's Chart, lab work & pertinent test results  Airway Mallampati: II  TM Distance: >3 FB Neck ROM: Full    Dental  (+) Teeth Intact, Dental Advisory Given   Pulmonary sleep apnea and Continuous Positive Airway Pressure Ventilation ,    Pulmonary exam normal breath sounds clear to auscultation       Cardiovascular hypertension, Pt. on medications +CHF  + dysrhythmias (LBBB) Atrial Fibrillation + Valvular Problems/Murmurs (TR) MR  Rhythm:Irregular Rate:Normal  Echo 04/13/18: Study Conclusions  - Left ventricle: The cavity size was mildly dilated. Wall   thickness was normal. Systolic function was moderately to severely reduced. The estimated ejection fraction was in the range of 30% to 35%. Diffuse hypokinesis. - Ventricular septum: Septal motion showed abnormal function, dyssynergy, and paradox. These changes are consistent with intraventricular conduction delay. - Mitral valve: Severely calcified annulus. - Left atrium: The atrium was severely dilated. - Atrial septum: No defect or patent foramen ovale was identified. - Pulmonary arteries: Systolic pressure was mildly increased. PA peak pressure: 38 mm Hg (S).   Neuro/Psych PSYCHIATRIC DISORDERS Anxiety TIA   GI/Hepatic negative GI ROS, Neg liver ROS,   Endo/Other  Morbid obesity  Renal/GU negative Renal ROS     Musculoskeletal  (+) Arthritis , Osteoarthritis,    Abdominal   Peds  Hematology  (+) Blood dyscrasia (Eliquis), anemia , Plt 208k   Anesthesia Other Findings Day of surgery medications reviewed with the patient.  Reproductive/Obstetrics                            Anesthesia Physical Anesthesia Plan  ASA: IV  Anesthesia Plan: Spinal   Post-op Pain Management:  Regional for Post-op pain    Induction:   PONV Risk Score and Plan: 2 and Treatment may vary due to age or medical condition, Propofol infusion and Midazolam  Airway Management Planned: Natural Airway and Simple Face Mask  Additional Equipment:   Intra-op Plan:   Post-operative Plan:   Informed Consent: I have reviewed the patients History and Physical, chart, labs and discussed the procedure including the risks, benefits and alternatives for the proposed anesthesia with the patient or authorized representative who has indicated his/her understanding and acceptance.   Dental advisory given  Plan Discussed with: CRNA, Anesthesiologist and Surgeon  Anesthesia Plan Comments:        Anesthesia Quick Evaluation

## 2018-07-06 NOTE — Discharge Instructions (Signed)
° °Dr. Frank Aluisio °Total Joint Specialist °[facility] °[address] °[place], [place] [address] °[phone] ° °TOTAL KNEE REVISION POSTOPERATIVE DIRECTIONS ° °Knee Rehabilitation, Guidelines Following Surgery  °Results after knee surgery are often greatly improved when you follow the exercise, range of motion and muscle strengthening exercises prescribed by your doctor. Safety measures are also important to protect the knee from further injury. Any time any of these exercises cause you to have increased pain or swelling in your knee joint, decrease the amount until you are comfortable again and slowly increase them. If you have problems or questions, call your caregiver or physical therapist for advice.  ° °HOME CARE INSTRUCTIONS  °• Remove items at home which could result in a fall. This includes throw rugs or furniture in walking pathways.  °· ICE to the affected knee every three hours for 30 minutes at a time and then as needed for pain and swelling.  Continue to use ice on the knee for pain and swelling from surgery. You may notice swelling that will progress down to the foot and ankle.  This is normal after surgery.  Elevate the leg when you are not up walking on it.   °· Continue to use the breathing machine which will help keep your temperature down.  It is common for your temperature to cycle up and down following surgery, especially at night when you are not up moving around and exerting yourself.  The breathing machine keeps your lungs expanded and your temperature down. °· Do not place pillow under knee, focus on keeping the knee straight while resting ° °DIET °You may resume your previous home diet once your are discharged from the hospital. ° °DRESSING / WOUND CARE / SHOWERING °You may shower 3 days after surgery, but keep the wounds dry during showering.  You may use an occlusive plastic wrap (Press'n Seal for example), NO SOAKING/SUBMERGING IN THE BATHTUB.  If the bandage gets  wet, change with a clean dry gauze.  If the incision gets wet, pat the wound dry with a clean towel. °You may start showering once you are discharged home but do not submerge the incision under water. Just pat the incision dry and apply a dry gauze dressing on daily. °Change the surgical dressing daily and reapply a dry dressing each time. ° °ACTIVITY °Walk with your walker as instructed. °Use walker as long as suggested by your caregivers. °Avoid periods of inactivity such as sitting longer than an hour when not asleep. This helps prevent blood clots.  °You may resume a sexual relationship in one month or when given the OK by your doctor.  °You may return to work once you are cleared by your doctor.  °Do not drive a car for 6 weeks or until released by you surgeon.  °Do not drive while taking narcotics. ° °WEIGHT BEARING °Weight bearing as tolerated with assist device (walker, cane, etc) as directed, use it as long as suggested by your surgeon or therapist, typically at least 4-6 weeks. ° °POSTOPERATIVE CONSTIPATION PROTOCOL °Constipation - defined medically as fewer than three stools per week and severe constipation as less than one stool per week. ° °One of the most common issues patients have following surgery is constipation.  Even if you have a regular bowel pattern at home, your normal regimen is likely to be disrupted due to multiple reasons following surgery.  Combination of anesthesia, postoperative narcotics, change in appetite and fluid intake all can affect your bowels.    In order to avoid complications following surgery, here are some recommendations in order to help you during your recovery period. ° °Colace (docusate) - Pick up an over-the-counter form of Colace or another stool softener and take twice a day as long as you are requiring postoperative pain medications.  Take with a full glass of water daily.  If you experience loose stools or diarrhea, hold the colace until you stool forms back up.  If  your symptoms do not get better within 1 week or if they get worse, check with your doctor. ° °Dulcolax (bisacodyl) - Pick up over-the-counter and take as directed by the product packaging as needed to assist with the movement of your bowels.  Take with a full glass of water.  Use this product as needed if not relieved by Colace only.  ° °MiraLax (polyethylene glycol) - Pick up over-the-counter to have on hand.  MiraLax is a solution that will increase the amount of water in your bowels to assist with bowel movements.  Take as directed and can mix with a glass of water, juice, soda, coffee, or tea.  Take if you go more than two days without a movement. °Do not use MiraLax more than once per day. Call your doctor if you are still constipated or irregular after using this medication for 7 days in a row. ° °If you continue to have problems with postoperative constipation, please contact the office for further assistance and recommendations.  If you experience "the worst abdominal pain ever" or develop nausea or vomiting, please contact the office immediatly for further recommendations for treatment. ° °ITCHING ° If you experience itching with your medications, try taking only a single pain pill, or even half a pain pill at a time.  You can also use Benadryl over the counter for itching or also to help with sleep.  ° °TED HOSE STOCKINGS °Wear the elastic stockings on both legs for three weeks following surgery during the day but you may remove then at night for sleeping. ° °MEDICATIONS °See your medication summary on the “After Visit Summary” that the nursing staff will review with you prior to discharge.  You may have some home medications which will be placed on hold until you complete the course of blood thinner medication.  It is important for you to complete the blood thinner medication as prescribed by your surgeon.  Continue your approved medications as instructed at time of discharge. ° °PRECAUTIONS °If you  experience chest pain or shortness of breath - call 911 immediately for transfer to the hospital emergency department.  °If you develop a fever greater that 101 F, purulent drainage from wound, increased redness or drainage from wound, foul odor from the wound/dressing, or calf pain - CONTACT YOUR SURGEON.   °                                                °FOLLOW-UP APPOINTMENTS °Make sure you keep all of your appointments after your operation with your surgeon and caregivers. You should call the office at the above phone number and make an appointment for approximately two weeks after the date of your surgery or on the date instructed by your surgeon outlined in the "After Visit Summary". ° ° °RANGE OF MOTION AND STRENGTHENING EXERCISES  °Rehabilitation of the knee is important following a knee injury or   an operation. After just a few days of immobilization, the muscles of the thigh which control the knee become weakened and shrink (atrophy). Knee exercises are designed to build up the tone and strength of the thigh muscles and to improve knee motion. Often times heat used for twenty to thirty minutes before working out will loosen up your tissues and help with improving the range of motion but do not use heat for the first two weeks following surgery. These exercises can be done on a training (exercise) mat, on the floor, on a table or on a bed. Use what ever works the best and is most comfortable for you Knee exercises include:  °• Leg Lifts - While your knee is still immobilized in a splint or cast, you can do straight leg raises. Lift the leg to 60 degrees, hold for 3 sec, and slowly lower the leg. Repeat 10-20 times 2-3 times daily. Perform this exercise against resistance later as your knee gets better.  °• Quad and Hamstring Sets - Tighten up the muscle on the front of the thigh (Quad) and hold for 5-10 sec. Repeat this 10-20 times hourly. Hamstring sets are done by pushing the foot backward against an  object and holding for 5-10 sec. Repeat as with quad sets.  °· Leg Slides: Lying on your back, slowly slide your foot toward your buttocks, bending your knee up off the floor (only go as far as is comfortable). Then slowly slide your foot back down until your leg is flat on the floor again. °· Angel Wings: Lying on your back spread your legs to the side as far apart as you can without causing discomfort.  °A rehabilitation program following serious knee injuries can speed recovery and prevent re-injury in the future due to weakened muscles. Contact your doctor or a physical therapist for more information on knee rehabilitation.  ° °IF YOU ARE TRANSFERRED TO A SKILLED REHAB FACILITY °If the patient is transferred to a skilled rehab facility following release from the hospital, a list of the current medications will be sent to the facility for the patient to continue.  When discharged from the skilled rehab facility, please have the facility set up the patient's Home Health Physical Therapy prior to being released. Also, the skilled facility will be responsible for providing the patient with their medications at time of release from the facility to include their pain medication, the muscle relaxants, and their blood thinner medication. If the patient is still at the rehab facility at time of the two week follow up appointment, the skilled rehab facility will also need to assist the patient in arranging follow up appointment in our office and any transportation needs. ° °MAKE SURE YOU:  °• Understand these instructions.  °• Get help right away if you are not doing well or get worse.  ° ° °Pick up stool softner and laxative for home use following surgery while on pain medications. °Do not submerge incision under water. °Please use good hand washing techniques while changing dressing each day. °May shower starting three days after surgery. °Please use a clean towel to pat the incision dry following showers. °Continue to  use ice for pain and swelling after surgery. °Do not use any lotions or creams on the incision until instructed by your surgeon. ° °

## 2018-07-06 NOTE — Op Note (Signed)
NAME: Kelly Dickson MEDICAL RECORD ZJ:[id number] ACCOUNT [id number] DATE OF BIRTH:Aug 21, 1973 FACILITY: [facility] LOCATION: [facility] PHYSICIAN:Nou Chard Dulcy Fanny, MD  OPERATIVE REPORT  DATE OF PROCEDURE:  07/06/2018  PREOPERATIVE DIAGNOSIS:  Failed right total knee arthroplasty.  POSTOPERATIVE DIAGNOSIS:  Failed right total knee arthroplasty.  PROCEDURE:  Right total knee arthroplasty revision.  SURGEON:  Gus Rankin. Laira Penninger, MD  ASSISTANT:  Dimitri Ped, PA-C.  ANESTHESIA:  Spinal and adductor canal block.  ESTIMATED BLOOD LOSS:  100 mL.  DRAINS:  Hemovac x1.  COMPLICATIONS:  None.  TOURNIQUET TIME:  Up 64 minutes at 300 mmHg, down 8 minutes and up additional 24 minutes at 300 mmHg.  COMPLICATIONS:  None.  CONDITION:  Stable to recovery.  BRIEF CLINICAL NOTE:  The patient is a 72 year old female who had a right total knee arthroplasty done approximately 10 years ago.  She had done well until the past year.  She started to develop pain and dysfunction.  The pain got progressively worse.   She presented to the office about 2 months ago with evidence of a loose prosthesis.  Workup for infection was negative.  She presents now for total knee arthroplasty revision.  PROCEDURE IN DETAIL:  After successful administration of adductor canal block and then spinal anesthetic, a tourniquet was placed high on her right thigh and her right lower extremity was prepped and draped in the usual sterile fashion.  Extremity was  wrapped in Esmarch and tourniquet inflated to 300 mmHg.  Midline incision is made with a 10 blade through subcutaneous tissue to the level of the extensor mechanism.  A fresh blade was used to make a medial parapatellar arthrotomy.  We encountered  minimal fluid in the joint.  Soft tissue over the proximal medial tibia subperiosteally elevate the joint line with a knife and into the semimembranosus bursa with a Cobb elevator.  There was a tremendous amount of fibrinous  wear debris in the joint in  bed and the synovium and this was excised thorough synovectomy.  The knee was then flexed and the patella was then everted, knee flexed 90 degrees and the tibial polyethylene removed.  A circumferential retraction was placed around the tibia and the  interface between the tibial component and bone is interrupted with an oscillating saw and the tibial component was easily removed.  There is a tremendous amount of fibrous tissue at the medial aspect of the tibia and there was a defect medially.  A size  3 component was removed.  The extramedullary tibial alignment guide was placed referencing proximally at the medial aspect of tibial tubercle and distally along the second metatarsal axis and tibial crest.  Block was pinned to remove about 2 mm of the  previously cut bone surface.  A resection was made with oscillating saw.  Size 3 was most appropriate component.  Cement was all removed from the canal.  I reamed up to 13 mm for a 13 mm stem.  I needed to take additional 10 mm off the medial side for a  step wedge on the medial side to get down to normal bone.  This resection was made with an oscillating saw.  We also prepared for a 29 sleeve with the broach.  The trial tibia was constructed was a size 3 MBT revision tray, 5 mm augment medially, 15 mm augment laterally, a 29 mm sleeve and 13 x 30 stem extension.  This was impacted into the tibia with excellent fit.  The trial was then removed.  The femur was then addressed.  The interface between the femoral component bone is interrupted with osteotomes and the femoral component removed without any bone loss.  Cement was then removed from the canal of the femur.  There is a lot of fibrinous  debris present also in the medial and lateral condyles, leaving some cavitary defects.  We did not have any structural defects.  I thoroughly irrigated the canal and then reamed up to 18 mm, which had an excellent press fit. An 18  mm reamer  was left in  place to serve as our intramedullary cutting guide.  Distal femoral cutting block was placed and the block pinned to remove about 2 mm off of the medial side.  I needed a 4 mm augment medial, had to go up to 8 mm lateral to get any bone so she had an 8  mm distal augment lateral and 4 mm distal augment medial.  Size 3 was the most appropriate femoral component.  The AP cutting block was placed.  Rotation marked off the epicondylar axis and confirmed by placing a spacer block to have created a  rectangular flexion gap at 90 degrees.  There is no anterior or chamfer cuts.  Posteriorly had to go up 4 mm on the medial side to get any bone and 8 mm on the lateral side to get any bone.  We thus placed a 4 mm posterior augment medially and 8 mm  posterior augment laterally.  The trial was then placed on the femoral side and tibial side.  Tibial side is as stated above.  On the femoral side to a size 3, TC3 femur with an 18 x 75 stem extension in a 5 degree right valgus position.  Augments are 4  mm distal medial, 8 mm distal lateral, 4 mm posteromedial and 8 mm posterolateral.  With this construct, a trial insert was placed  17.5 mm.  It was a tiny bit of hyperextension noted at 20 mm, which allowed for full extension and excellent varus/valgus  and anterior/posterior balance throughout full range of motion.  Patella was again everted.  Patellar component was intact, but was covered with soft tissue.  We removed the soft tissue performing a patelloplasty.  The patella tracks normally.  At this  point, I released the tourniquet for 8 minutes and kept it down for 8 minutes.  Minimal bleeding was encountered and that which is encountered was stopped with cautery.  During this time, the components were assembled on the back table.  After 8 minutes,  the leg was rewrapped in Esmarch and tourniquet reinflated to 300 mmHg.  Once the components were assembled, then the trials were removed and the cut bone  surfaces were prepared with pulsatile lavage.  Three batches of gentamicin cement were mixed and  it is injected into the tibial canal and tibial surface.  Tibial component was cemented first and impacted and all extruded cement removed.  Femoral side was then cemented distally with a press-fit stem.  Trial 20 mm insert was placed.  Then held in full  extension.  All extruded cement removed.  Cement was fully hardened and the permanent 20 mm TC3 insert is placed into the tibial tray and the knee was reduced with excellent stability throughout full range of motion.  Wound was again copiously irrigated  with saline solution.  Twenty mL of Exparel mixed with 60 mL of saline was injected into the extensor mechanism, subcu tissues and periosteum of the femur.  The wound was again irrigated with saline and then the arthrotomy closed over a Hemovac drain  with a running 0 Stratafix suture.  Tourniquet was again released at this time for 24 minutes.  Subcu was then closed over another drain with interrupted 2-0 Vicryl.  Skin was closed with staples.  Incision cleaned and dried and a bulky sterile dressing  applied.  Drain was hooked to suction and she was placed into a knee immobilizer, awakened and transported to recovery in stable condition.  Note that a surgical assistant was a medical necessity for this procedure to do it in a safe and expeditious manner.  Surgical assistant was necessary for retraction of vital ligaments and neurovascular structures and for proper positioning of the limb  for safe removal of the old implant and safe and accurate placement of the new implant.  AN/NUANCE  D:07/06/2018 T:07/06/2018 JOB:[id number]

## 2018-07-06 NOTE — Anesthesia Procedure Notes (Signed)
Anesthesia Regional Block: Adductor canal block   Pre-Anesthetic Checklist: ,, timeout performed, Correct Patient, Correct Site, Correct Laterality, Correct Procedure, Correct Position, site marked, Risks and benefits discussed,  Surgical consent,  Pre-op evaluation,  At surgeon's request and post-op pain management  Laterality: Right  Prep: chloraprep       Needles:  Injection technique: Single-shot  Needle Type: Echogenic Needle     Needle Length: 9cm  Needle Gauge: 21     Additional Needles:   Procedures:,,,, ultrasound used (permanent image in chart),,,,  Narrative:  Start time: 07/06/2018 2:05 PM End time: 07/06/2018 2:10 PM Injection made incrementally with aspirations every 5 mL.  Performed by: Personally  Anesthesiologist: Cecile Hearing, MD  Additional Notes: No pain on injection. No increased resistance to injection. Injection made in 5cc increments.  Good needle visualization.  Patient tolerated procedure well.

## 2018-07-06 NOTE — Brief Op Note (Signed)
07/06/2018  4:50 PM  PATIENT:  Kelly Dickson  72 y.o. female  PRE-OPERATIVE DIAGNOSIS:  loose right total knee arthroplasty  POST-OPERATIVE DIAGNOSIS:  loose right total knee arthroplasty  PROCEDURE:  Procedure(s) with comments: RIGHT TOTAL KNEE REVISION (Right) -  SURGEON:  Surgeon(s) and Role:    Ollen Gross, MD - Primary  PHYSICIAN ASSISTANT:   ASSISTANTS: Dimitri Ped, PA-C   ANESTHESIA:   general and adductor canal block   EBL:  100 mL   BLOOD ADMINISTERED:none  DRAINS: (Medium) Hemovact drain(s) in the right knee with  Suction Open   LOCAL MEDICATIONS USED:  OTHER Exparel  COUNTS:  YES  TOURNIQUET:   Total Tourniquet Time Documented: Thigh (Right) - 65 minutes Thigh (Right) - 24 minutes Total: Thigh (Right) - 89 minutes   DICTATION: .Other Dictation: Dictation Number [id number]  PLAN OF CARE: Admit to inpatient   PATIENT DISPOSITION:  PACU - hemodynamically stable.

## 2018-07-06 NOTE — Transfer of Care (Signed)
Immediate Anesthesia Transfer of Care Note  Patient: Kelly Dickson  Procedure(s) Performed: Procedure(s) with comments: RIGHT TOTAL KNEE REVISION (Right) -  Patient Location: PACU  Anesthesia Type:Spinal  Level of Consciousness:  sedated, patient cooperative and responds to stimulation  Airway & Oxygen Therapy:Patient Spontanous Breathing and Patient connected to face mask oxgen  Post-op Assessment:  Report given to PACU RN and Post -op Vital signs reviewed and stable  Post vital signs:  Reviewed and stable  Last Vitals:  Vitals:   07/06/18 1416 07/06/18 1417  BP:    Pulse: 78 79  Resp: 17 18  Temp:    SpO2: 92% 93%    Complications: No apparent anesthesia complications

## 2018-07-06 NOTE — Progress Notes (Signed)
Assisted Dr. Turk with right, ultrasound guided, adductor canal block. Side rails up, monitors on throughout procedure. See vital signs in flow sheet. Tolerated Procedure well.  

## 2018-07-06 NOTE — Anesthesia Procedure Notes (Signed)
Spinal  Patient location during procedure: OR Start time: 07/06/2018 2:30 PM End time: 07/06/2018 2:32 PM Staffing Anesthesiologist: Catalina Gravel, MD Resident/CRNA: Lind Covert, CRNA Performed: resident/CRNA  Preanesthetic Checklist Completed: patient identified, site marked, surgical consent, pre-op evaluation, timeout performed, IV checked, risks and benefits discussed and monitors and equipment checked Spinal Block Patient position: sitting Prep: DuraPrep Patient monitoring: heart rate, cardiac monitor, continuous pulse ox and blood pressure Approach: midline Location: L3-4 Injection technique: single-shot Needle Needle type: Pencan  Needle gauge: 24 G Needle length: 10 cm Assessment Sensory level: T6 Additional Notes Timeout performed. SAB kit date checked. SAB without difficulty.

## 2018-07-06 NOTE — Interval H&P Note (Signed)
History and Physical Interval Note:  07/06/2018 2:11 PM  Kelly Dickson  has presented today for surgery, with the diagnosis of loose right total knee arthroplasty  The various methods of treatment have been discussed with the patient and family. After consideration of risks, benefits and other options for treatment, the patient has consented to  Procedure(s) with comments: RIGHT TOTAL KNEE REVISION (Right) - as a surgical intervention .  The patient's history has been reviewed, patient examined, no change in status, stable for surgery.  I have reviewed the patient's chart and labs.  Questions were answered to the patient's satisfaction.     Homero Fellers Moiz Ryant

## 2018-07-06 NOTE — Anesthesia Postprocedure Evaluation (Signed)
Anesthesia Post Note  Patient: Kelly Dickson  Procedure(s) Performed: RIGHT TOTAL KNEE REVISION (Right Knee)     Patient location during evaluation: PACU Anesthesia Type: Spinal Level of consciousness: oriented, awake and alert and awake Pain management: pain level controlled Vital Signs Assessment: post-procedure vital signs reviewed and stable Respiratory status: spontaneous breathing, respiratory function stable and nonlabored ventilation Cardiovascular status: blood pressure returned to baseline and stable Postop Assessment: no headache, no backache and no apparent nausea or vomiting Anesthetic complications: no    Last Vitals:  Vitals:   07/06/18 1800 07/06/18 1906  BP: (!) 151/84 (!) 148/83  Pulse: 73 73  Resp: 19 16  Temp: (!) 36.4 C (!) 36.4 C  SpO2: 100% 100%    Last Pain:  Vitals:   07/06/18 1906  TempSrc: Oral  PainSc:                  Cecile Hearing

## 2018-07-07 ENCOUNTER — Encounter (HOSPITAL_COMMUNITY): Payer: Self-pay | Admitting: Orthopedic Surgery

## 2018-07-07 ENCOUNTER — Other Ambulatory Visit: Payer: Self-pay

## 2018-07-07 LAB — BASIC METABOLIC PANEL
ANION GAP: 9 (ref 5–15)
BUN: 20 mg/dL (ref 8–23)
CALCIUM: 8.7 mg/dL — AB (ref 8.9–10.3)
CHLORIDE: 110 mmol/L (ref 98–111)
CO2: 26 mmol/L (ref 22–32)
Creatinine, Ser: 0.95 mg/dL (ref 0.44–1.00)
GFR calc non Af Amer: 58 mL/min — ABNORMAL LOW (ref >60)
GLUCOSE: 160 mg/dL — AB (ref 70–99)
Potassium: 4.1 mmol/L (ref 3.5–5.1)
Sodium: 145 mmol/L (ref 135–145)

## 2018-07-07 LAB — CBC
HCT: 30.8 % — ABNORMAL LOW (ref 36.0–46.0)
Hemoglobin: 9.3 g/dL — ABNORMAL LOW (ref 12.0–15.0)
MCH: 27 pg (ref 26.0–34.0)
MCHC: 30.2 g/dL (ref 30.0–36.0)
MCV: 89.3 fL (ref 78.0–100.0)
PLATELETS: 191 10*3/uL (ref 150–400)
RBC: 3.45 MIL/uL — AB (ref 3.87–5.11)
RDW: 17.5 % — ABNORMAL HIGH (ref 11.5–15.5)
WBC: 8.3 10*3/uL (ref 4.0–10.5)

## 2018-07-07 NOTE — Evaluation (Signed)
Physical Therapy Evaluation Patient Details Name: Kelly Dickson MRN: [id number] DOB: 09-15-1946 Today's Date: 07/07/2018   History of Present Illness  Patient is a 72 y/o female admitted for right total knee revision.  PMH significant for NICM (last EF 35% in February 2019), paroxysmal A. fib on Eliquis, TIA in 2018, hypertension, hyperlipidemia, R TKA, L TKA,  and peripheral neuropathy.  Clinical Impression  Patient is s/p above surgery resulting in functional limitations due to the deficits listed below (see PT Problem List).  Patient will benefit from skilled PT to increase their independence and safety with mobility to allow discharge to the venue listed below.  Pt assisted with ambulating in hallway and requiring max cues for technique and safety.  Pt plans to d/c to SNF.     Follow Up Recommendations Follow surgeon's recommendation for DC plan and follow-up therapies(plans for SNF)    Equipment Recommendations  None recommended by PT    Recommendations for Other Services       Precautions / Restrictions Precautions Precautions: Knee;Fall Required Braces or Orthoses: Knee Immobilizer - Right Restrictions Other Position/Activity Restrictions: WBAT      Mobility  Bed Mobility Overal bed mobility: Needs Assistance Bed Mobility: Supine to Sit     Supine to sit: Min guard;HOB elevated     General bed mobility comments: increased time and effort however pt did not require assist  Transfers Overall transfer level: Needs assistance Equipment used: Rolling walker (2 wheeled) Transfers: Sit to/from Stand Sit to Stand: Min assist         General transfer comment: assist to rise and steady, verbal cues for UE and LE positioning  Ambulation/Gait Ambulation/Gait assistance: Min assist;+2 safety/equipment Gait Distance (Feet): 80 Feet Assistive device: Rolling walker (2 wheeled) Gait Pattern/deviations: Step-to pattern;Decreased stance time - right;Antalgic      General Gait Details: max verbal cues for sequence, RW positioning, posture, step length  Stairs            Wheelchair Mobility    Modified Rankin (Stroke Patients Only)       Balance                                             Pertinent Vitals/Pain Pain Assessment: 0-10 Pain Score: 3  Pain Location: R knee Pain Descriptors / Indicators: Sore;Aching Pain Intervention(s): Limited activity within patient's tolerance;Repositioned;Monitored during session;Ice applied    Home Living Family/patient expects to be discharged to:: Skilled nursing facility Living Arrangements: Alone                    Prior Function Level of Independence: Independent with assistive device(s)         Comments: SPC     Hand Dominance        Extremity/Trunk Assessment        Lower Extremity Assessment Lower Extremity Assessment: RLE deficits/detail RLE Deficits / Details: able to perform SLR, maintained KI for safety/pain control, ROM TBA       Communication   Communication: No difficulties  Cognition Arousal/Alertness: Awake/alert Behavior During Therapy: WFL for tasks assessed/performed Overall Cognitive Status: Within Functional Limits for tasks assessed  General Comments      Exercises     Assessment/Plan    PT Assessment Patient needs continued PT services  PT Problem List Decreased strength;Decreased range of motion;Decreased mobility;Decreased knowledge of use of DME;Decreased activity tolerance;Pain;Decreased knowledge of precautions       PT Treatment Interventions Stair training;Gait training;DME instruction;Therapeutic activities;Therapeutic exercise;Patient/family education;Functional mobility training    PT Goals (Current goals can be found in the Care Plan section)  Acute Rehab PT Goals PT Goal Formulation: With patient Time For Goal Achievement: 07/13/18 Potential to  Achieve Goals: Good    Frequency 7X/week   Barriers to discharge        Co-evaluation               AM-PAC PT "6 Clicks" Daily Activity  Outcome Measure Difficulty turning over in bed (including adjusting bedclothes, sheets and blankets)?: A Little Difficulty moving from lying on back to sitting on the side of the bed? : A Lot Difficulty sitting down on and standing up from a chair with arms (e.g., wheelchair, bedside commode, etc,.)?: Unable Help needed moving to and from a bed to chair (including a wheelchair)?: A Little Help needed walking in hospital room?: A Little Help needed climbing 3-5 steps with a railing? : A Lot 6 Click Score: 14    End of Session Equipment Utilized During Treatment: Gait belt;Right knee immobilizer Activity Tolerance: Patient tolerated treatment well Patient left: in chair;with chair alarm set;with family/visitor present;with call bell/phone within reach Nurse Communication: Mobility status PT Visit Diagnosis: Other abnormalities of gait and mobility (R26.89)    Time: 9528-4132 PT Time Calculation (min) (ACUTE ONLY): 24 min   Charges:   PT Evaluation $PT Eval Low Complexity: 1 Low PT Treatments $Gait Training: 8-22 mins        Zenovia Jarred, PT, DPT 07/07/2018 Pager: [phone]  Maida Sale E 07/07/2018, 12:30 PM

## 2018-07-07 NOTE — NC FL2 (Addendum)
[place] MEDICAID FL2 LEVEL OF CARE SCREENING TOOL     IDENTIFICATION  Patient Name: Kelly Dickson Birthdate: 1946/08/09 Sex: female Admission Date (Current Location): 07/06/2018  [facility] and [place] Number:  Producer, television/film/video and Address:  [facility],  [address] [place] [address]      Provider Number: [id number]  Attending Physician Name and Address:  Ollen Gross, MD  Relative Name and Phone Number:       Current Level of Care: Hospital Recommended Level of Care: Skilled Nursing Facility Prior Approval Number:    Date Approved/Denied:   PASRR Number: [id number]  Discharge Plan: SNF    Current Diagnoses: Patient Active Problem List   Diagnosis Date Noted  . 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 Acute metabolic encephalopathy 04/13/2018  . Acute kidney injury (HCC)   . Hyperkalemia   . Chronic systolic CHF (congestive heart failure) (HCC)   . AKI (acute kidney injury) (HCC) 04/12/2018  . TIA (transient ischemic attack) 04/27/2017  . Bradycardia 01/09/2016  . PAF (paroxysmal atrial fibrillation) (HCC)   . LBBB (left bundle branch block) 12/22/2015  . Leg swelling 11/25/2015  . Cellulitis of leg, left 11/25/2015  . Panic attack   . CHF (congestive heart failure) (HCC)   . Essential hypertension   . MR (mitral regurgitation)   . TR (tricuspid regurgitation)   . Pulmonary hypertension (HCC)   . HLD (hyperlipidemia)     Orientation RESPIRATION BLADDER Height & Weight     Self, Time, Situation, Place  Normal Continent Weight: 231 lb 9.6 oz (105.1 kg) Height:  5\' 3"  (160 cm)  BEHAVIORAL SYMPTOMS/MOOD NEUROLOGICAL BOWEL NUTRITION STATUS      Continent Diet(See discharge summary )  AMBULATORY STATUS COMMUNICATION OF NEEDS Skin   Extensive Assist Verbally Surgical Incision. Knee.                        Personal Care Assistance Level of Assistance  Bathing, Feeding, Dressing Bathing Assistance: Maximum  assistance Feeding assistance: Independent Dressing Assistance: Maximum assistance     Functional Limitations Info  Sight, Hearing, Speech Sight Info: Impaired(Wears Glasses) Hearing Info: Adequate Speech Info: Adequate    SPECIAL CARE FACTORS FREQUENCY  PT (By licensed PT), OT (By licensed OT)     PT Frequency: 7x/week OT Frequency: 7x/week            Contractures Contractures Info: Not present    Additional Factors Info  Code Status, Allergies Code Status Info: Fullcode Allergies Info: Sulfamethoxazole-trimethoprim           Current Medications (07/07/2018):  This is the current hospital active medication list Current Facility-Administered Medications  Medication Dose Route Frequency Provider Last Rate Last Dose  . 0.9 %  sodium chloride infusion   Intravenous Continuous Ollen Gross, MD 75 mL/hr at 07/06/18 2035    . acetaminophen (TYLENOL) tablet 1,000 mg  1,000 mg Oral Q6H Aluisio, Homero Fellers, MD   1,000 mg at 07/07/18 5621  . apixaban (ELIQUIS) tablet 2.5 mg  2.5 mg Oral Q12H Ollen Gross, MD   2.5 mg at 07/07/18 0811  . bisacodyl (DULCOLAX) suppository 10 mg  10 mg Rectal Daily PRN Ollen Gross, MD      . dexamethasone (DECADRON) injection 10 mg  10 mg Intravenous Once Aluisio, Homero Fellers, MD      . diphenhydrAMINE (BENADRYL) 12.5 MG/5ML elixir 12.5-25 mg  12.5-25 mg Oral Q4H PRN Ollen Gross, MD      .  docusate sodium (COLACE) capsule 100 mg  100 mg Oral BID Ollen Gross, MD   100 mg at 07/06/18 2025  . DULoxetine (CYMBALTA) DR capsule 60 mg  60 mg Oral Daily Aluisio, Homero Fellers, MD      . furosemide (LASIX) tablet 40 mg  40 mg Oral Daily Aluisio, Homero Fellers, MD      . gabapentin (NEURONTIN) capsule 300 mg  300 mg Oral TID Ollen Gross, MD   300 mg at 07/06/18 2024  . HYDROmorphone (DILAUDID) injection 0.5-1 mg  0.5-1 mg Intravenous Q4H PRN Ollen Gross, MD   1 mg at 07/07/18 0238  . loratadine (CLARITIN) tablet 10 mg  10 mg Oral Daily PRN Aluisio, Homero Fellers, MD      .  menthol-cetylpyridinium (CEPACOL) lozenge 3 mg  1 lozenge Oral PRN Ollen Gross, MD       Or  . phenol (CHLORASEPTIC) mouth spray 1 spray  1 spray Mouth/Throat PRN Aluisio, Homero Fellers, MD      . methocarbamol (ROBAXIN) tablet 500 mg  500 mg Oral Q6H PRN Ollen Gross, MD       Or  . methocarbamol (ROBAXIN) 500 mg in dextrose 5 % 50 mL IVPB  500 mg Intravenous Q6H PRN Aluisio, Homero Fellers, MD      . metoCLOPramide (REGLAN) tablet 5-10 mg  5-10 mg Oral Q8H PRN Ollen Gross, MD       Or  . metoCLOPramide (REGLAN) injection 5-10 mg  5-10 mg Intravenous Q8H PRN Aluisio, Homero Fellers, MD      . ondansetron (ZOFRAN) tablet 4 mg  4 mg Oral Q6H PRN Ollen Gross, MD       Or  . ondansetron (ZOFRAN) injection 4 mg  4 mg Intravenous Q6H PRN Aluisio, Homero Fellers, MD      . oxyCODONE (Oxy IR/ROXICODONE) immediate release tablet 10-15 mg  10-15 mg Oral Q4H PRN Ollen Gross, MD   15 mg at 07/06/18 2346  . oxyCODONE (Oxy IR/ROXICODONE) immediate release tablet 5-10 mg  5-10 mg Oral Q4H PRN Ollen Gross, MD   10 mg at 07/07/18 0811  . polyethylene glycol (MIRALAX / GLYCOLAX) packet 17 g  17 g Oral Daily PRN Aluisio, Homero Fellers, MD      . rosuvastatin (CRESTOR) tablet 40 mg  40 mg Oral q1800 Aluisio, Homero Fellers, MD      . sacubitril-valsartan (ENTRESTO) 97-103 mg per tablet  1 tablet Oral BID Aluisio, Homero Fellers, MD      . sodium phosphate (FLEET) 7-19 GM/118ML enema 1 enema  1 enema Rectal Once PRN Ollen Gross, MD         Discharge Medications: Please see discharge summary for a list of discharge medications.  Relevant Imaging Results:  Relevant Lab Results:   Additional Information SS#: [id number]  Clearance Coots, LCSW

## 2018-07-07 NOTE — Progress Notes (Addendum)
   Subjective: 1 Day Post-Op Procedure(s) (LRB): RIGHT TOTAL KNEE REVISION (Right) Patient reports pain as moderate.   Patient seen in rounds with Dr. Lequita Halt. Patient is well, and has had no acute complaints or problems other than pain in the right knee. Foley catheter to be removed this AM. Denies chest pain, SOB or calf pain. Endorses good appetite.  We will start therapy today.   Objective: Vital signs in last 24 hours: Temp:  [97.4 F (36.3 C)-98.6 F (37 C)] 97.6 F (36.4 C) (09/05 0554) Pulse Rate:  [56-132] 93 (09/05 0554) Resp:  [14-29] 18 (09/05 0554) BP: (117-167)/(69-127) 155/127 (09/05 0554) SpO2:  [92 %-100 %] 99 % (09/05 0554) Weight:  [105.1 kg] 105.1 kg (09/04 1353)  Intake/Output from previous day:  Intake/Output Summary (Last 24 hours) at 07/07/2018 0805 Last data filed at 07/07/2018 0700 Gross per 24 hour  Intake 2635.55 ml  Output 1320 ml  Net 1315.55 ml    Labs: Recent Labs    07/07/18 0520  HGB 9.3*   Recent Labs    07/07/18 0520  WBC 8.3  RBC 3.45*  HCT 30.8*  PLT 191   Recent Labs    07/07/18 0520  NA 145  K 4.1  CL 110  CO2 26  BUN 20  CREATININE 0.95  GLUCOSE 160*  CALCIUM 8.7*   Exam: General - Patient is Alert and Oriented Extremity - Neurologically intact Neurovascular intact Sensation intact distally Dorsiflexion/Plantar flexion intact Dressing - dressing C/D/I Motor Function - intact, moving foot and toes well on exam.   Past Medical History:  Diagnosis Date  . A-fib (HCC)   . Arthritis   . CHF (congestive heart failure) (HCC)   . Essential hypertension   . HLD (hyperlipidemia)   . Kidney failure    D/T bACTRIM   . MR (mitral regurgitation)   . OSA (obstructive sleep apnea)    WITH NASAL PRONG   . Panic attack   . Pulmonary hypertension (HCC)   . TR (tricuspid regurgitation)     Assessment/Plan: 1 Day Post-Op Procedure(s) (LRB): RIGHT TOTAL KNEE REVISION (Right) Principal Problem:   Failed total knee  arthroplasty (HCC)  Estimated body mass index is 41.03 kg/m as calculated from the following:   Height as of this encounter: 5\' 3"  (1.6 m).   Weight as of this encounter: 105.1 kg. Advance diet Up with therapy  DVT Prophylaxis - Eliquis Weight bearing as tolerated. D/C O2 and pulse ox and try on room air. Hemovac pulled without difficulty, will begin therapy today.  Plan is to go to Skilled nursing facility after hospital stay. Order placed for social work consult. Possible discharge tomorrow pending available beds.   Arther Abbott, PA-C Orthopedic Surgery 07/07/2018, 8:05 AM

## 2018-07-07 NOTE — Clinical Social Work Note (Signed)
Clinical Social Work Assessment  Patient Details  Name: Kelly Dickson MRN: [id number] Date of Birth: 1946/05/29  Date of referral:  07/07/18               Reason for consult:  Facility Placement                Permission sought to share information with:    Permission granted to share information::  Yes, Verbal Permission Granted  Name::        Agency::  SNF  Relationship::  Daughters  Contact Information:     Housing/Transportation Living arrangements for the past 2 months:  Single Family Home Source of Information:  Patient Patient Interpreter Needed:  None Criminal Activity/Legal Involvement Pertinent to Current Situation/Hospitalization:  No - Comment as needed Significant Relationships:  Adult Children Lives with:  Self Do you feel safe going back to the place where you live?  Yes Need for family participation in patient care:  Yes   Care giving concerns:   Patient is being admitted for right revision total knee arthroplasty.  SNF placement for rehab.   Social Worker assessment / plan: CSW met with the patient at bedside to discuss discharge planning to SNF. Patient alert, oriented and very pleasant. Patient understands the plan at discharge per surgeon is for SNF placement for short rehab. Patient states prior to surgery she was very independent and able to complete her own ADL's and still drives. Patient reports also having family support from her daughter Kelly Dickson. Patient reports she was in rehab about 2 months ago.Patient some understanding of the SNF process. CSW sent patient clinicals to SNF facilities in the area. Patient prefers [facility] if there is space available.  CSW will follow up with bed offers.   FL2 complete.  PASRR complete.   Plan: SNF  Employment status:  Retired Nurse, adult PT Recommendations:  [place] / Referral to community resources:  [place]  Patient/Family's  Response to care:  Agreeable and Responding well to care.   Patient/Family's Understanding of and Emotional Response to Diagnosis, Current Treatment, and Prognosis: "I am just ready to get up and be active again." Patient has a good understanding of her diagnosis and follow up care. Patient also informed CSW to contact daughter Kelly Dickson as well.   Emotional Assessment Appearance:  Appears stated age Attitude/Demeanor/Rapport:    Affect (typically observed):  Accepting, Adaptable, Pleasant Orientation:  Oriented to Self, Oriented to Place, Oriented to  Time, Oriented to Situation Alcohol / Substance use:  Not Applicable Psych involvement (Current and /or in the community):  No (Comment)  Discharge Needs  Concerns to be addressed:  Discharge Planning Concerns Readmission within the last 30 days:  No Current discharge risk:  Dependent with Mobility, Physical Impairment Barriers to Discharge:  Continued Medical Work up, [place], LCSW 07/07/2018, 10:16 AM

## 2018-07-07 NOTE — Progress Notes (Signed)
Patient chose bed at [facility]. [employer]. Patient can discharge to SNF after authorization is received.

## 2018-07-07 NOTE — Progress Notes (Signed)
Pt and pt's Daughter are requesting the pt be d/c with an Rx for Crestor. Per the pt and pt's Daughter, the pt was admitted in June, 2019 and Maisie Fus, MD prescribed Crestor, but the pt experienced difficulty when trying to refill the Rx. I told the pt and Daughter I would relay the concern to Dr. Lequita Halt.

## 2018-07-07 NOTE — Progress Notes (Signed)
Physical Therapy Treatment Patient Details Name: Kelly Dickson MRN: [id number] DOB: 1946/10/07 Today's Date: 07/07/2018    History of Present Illness Patient is a 72 y/o female admitted for right total knee revision.  PMH significant for NICM (last EF 35% in February 2019), paroxysmal A. fib on Eliquis, TIA in 2018, hypertension, hyperlipidemia, R TKA, L TKA,  and peripheral neuropathy.    PT Comments    Pt performed LE exercises in recliner and then assisted with ambulating in hallway.  Pt also assisted to bathroom prior to return to bed per her request.    Follow Up Recommendations  Follow surgeon's recommendation for DC plan and follow-up therapies(plans for SNF)     Equipment Recommendations  None recommended by PT    Recommendations for Other Services       Precautions / Restrictions Precautions Precautions: Knee;Fall Required Braces or Orthoses: Knee Immobilizer - Right Restrictions Other Position/Activity Restrictions: WBAT    Mobility  Bed Mobility Overal bed mobility: Needs Assistance Bed Mobility: Sit to Supine     Supine to sit: Min guard;HOB elevated Sit to supine: Min assist   General bed mobility comments: assist for R LE   Transfers Overall transfer level: Needs assistance Equipment used: Rolling walker (2 wheeled) Transfers: Sit to/from Stand Sit to Stand: Min assist         General transfer comment: assist to rise and steady, verbal cues for UE and LE positioning  Ambulation/Gait Ambulation/Gait assistance: Min assist Gait Distance (Feet): 100 Feet Assistive device: Rolling walker (2 wheeled) Gait Pattern/deviations: Step-to pattern;Decreased stance time - right;Antalgic     General Gait Details: max verbal cues for sequence, RW positioning, posture, step length   Stairs             Wheelchair Mobility    Modified Rankin (Stroke Patients Only)       Balance                                             Cognition Arousal/Alertness: Awake/alert Behavior During Therapy: WFL for tasks assessed/performed Overall Cognitive Status: Within Functional Limits for tasks assessed                                 General Comments: tangential likely due to meds      Exercises Total Joint Exercises Ankle Circles/Pumps: AROM;Both;10 reps Quad Sets: AROM;10 reps;Both Short Arc Quad: AROM;10 reps;Right Heel Slides: Right;AAROM;10 reps Hip ABduction/ADduction: Right;AROM;10 reps Straight Leg Raises: AAROM;10 reps;Right Goniometric ROM: approx -5-50* AAROM R knee    General Comments        Pertinent Vitals/Pain Pain Assessment: 0-10 Pain Score: 5  Pain Location: R knee Pain Descriptors / Indicators: Sore;Aching Pain Intervention(s): Repositioned;Monitored during session;Limited activity within patient's tolerance    Home Living Family/patient expects to be discharged to:: Skilled nursing facility Living Arrangements: Alone                  Prior Function Level of Independence: Independent with assistive device(s)      Comments: SPC   PT Goals (current goals can now be found in the care plan section) Acute Rehab PT Goals PT Goal Formulation: With patient Time For Goal Achievement: 07/13/18 Potential to Achieve Goals: Good Progress towards PT goals: Progressing toward goals    Frequency  7X/week      PT Plan Current plan remains appropriate    Co-evaluation              AM-PAC PT "6 Clicks" Daily Activity  Outcome Measure  Difficulty turning over in bed (including adjusting bedclothes, sheets and blankets)?: A Little Difficulty moving from lying on back to sitting on the side of the bed? : Unable Difficulty sitting down on and standing up from a chair with arms (e.g., wheelchair, bedside commode, etc,.)?: Unable Help needed moving to and from a bed to chair (including a wheelchair)?: A Little Help needed walking in hospital room?: A  Little Help needed climbing 3-5 steps with a railing? : A Lot 6 Click Score: 13    End of Session Equipment Utilized During Treatment: Gait belt;Right knee immobilizer Activity Tolerance: Patient tolerated treatment well Patient left: with family/visitor present;with call bell/phone within reach;in bed;with bed alarm set Nurse Communication: Mobility status PT Visit Diagnosis: Other abnormalities of gait and mobility (R26.89)     Time: 1610-9604 PT Time Calculation (min) (ACUTE ONLY): 35 min  Charges:  $Gait Training: 8-22 mins $Therapeutic Exercise: 8-22 mins                     Zenovia Jarred, PT, DPT 07/07/2018 Pager: [phone]  Maida Sale E 07/07/2018, 3:27 PM

## 2018-07-07 NOTE — Progress Notes (Signed)
Patient refused foley removal until 10:00 a.m. Pain controlled.

## 2018-07-08 ENCOUNTER — Encounter (HOSPITAL_COMMUNITY): Payer: Self-pay

## 2018-07-08 LAB — CBC
HEMATOCRIT: 30.2 % — AB (ref 36.0–46.0)
Hemoglobin: 9.2 g/dL — ABNORMAL LOW (ref 12.0–15.0)
MCH: 27.3 pg (ref 26.0–34.0)
MCHC: 30.5 g/dL (ref 30.0–36.0)
MCV: 89.6 fL (ref 78.0–100.0)
Platelets: 191 10*3/uL (ref 150–400)
RBC: 3.37 MIL/uL — ABNORMAL LOW (ref 3.87–5.11)
RDW: 17.9 % — AB (ref 11.5–15.5)
WBC: 10.3 10*3/uL (ref 4.0–10.5)

## 2018-07-08 LAB — BASIC METABOLIC PANEL
Anion gap: 10 (ref 5–15)
BUN: 29 mg/dL — AB (ref 8–23)
CALCIUM: 9 mg/dL (ref 8.9–10.3)
CO2: 26 mmol/L (ref 22–32)
CREATININE: 1.17 mg/dL — AB (ref 0.44–1.00)
Chloride: 108 mmol/L (ref 98–111)
GFR calc non Af Amer: 45 mL/min — ABNORMAL LOW (ref >60)
GFR, EST AFRICAN AMERICAN: 53 mL/min — AB (ref >60)
Glucose, Bld: 140 mg/dL — ABNORMAL HIGH (ref 70–99)
Potassium: 4.3 mmol/L (ref 3.5–5.1)
Sodium: 144 mmol/L (ref 135–145)

## 2018-07-08 MED ORDER — METHOCARBAMOL 500 MG PO TABS: 500.0000 mg | ORAL_TABLET | Freq: Four times a day (QID) | ORAL | 0 refills | Status: DC | PRN

## 2018-07-08 MED ORDER — ROSUVASTATIN CALCIUM 20 MG PO TABS: 40.0000 mg | ORAL_TABLET | Freq: Every day | ORAL | Status: DC

## 2018-07-08 MED ORDER — OXYCODONE HCL 5 MG PO TABS: 5.0000 mg | ORAL_TABLET | Freq: Four times a day (QID) | ORAL | 0 refills | Status: DC | PRN

## 2018-07-08 MED ORDER — ROSUVASTATIN CALCIUM 40 MG PO TABS: 40.0000 mg | ORAL_TABLET | Freq: Every day | ORAL | 0 refills | Status: DC

## 2018-07-08 NOTE — Progress Notes (Signed)
Physical Therapy Treatment Patient Details Name: Kelly Dickson MRN: [id number] DOB: 02/05/1946 Today's Date: 07/08/2018    History of Present Illness Patient is a 72 y/o female admitted for right total knee revision.  PMH significant for NICM (last EF 35% in February 2019), paroxysmal A. fib on Eliquis, TIA in 2018, hypertension, hyperlipidemia, R TKA, L TKA,  and peripheral neuropathy.    PT Comments    Pt assisted with performing LE exercises and then ambulated again in hallway.  Pt requires increased time and cues.  SpO2 monitored during session and 94-97% on room air.  Pt likely to d/c to SNF today.  Follow Up Recommendations  Follow surgeon's recommendation for DC plan and follow-up therapies     Equipment Recommendations  None recommended by PT    Recommendations for Other Services       Precautions / Restrictions Precautions Precautions: Knee;Fall Required Braces or Orthoses: Knee Immobilizer - Right Restrictions Other Position/Activity Restrictions: WBAT    Mobility  Bed Mobility               General bed mobility comments: pt sitting on BSC in front of bathroom sink on arrival (daughters in room)  Transfers Overall transfer level: Needs assistance Equipment used: Rolling walker (2 wheeled) Transfers: Sit to/from Stand Sit to Stand: Min assist         General transfer comment: assist to rise and steady, verbal cues for UE and LE positioning  Ambulation/Gait Ambulation/Gait assistance: Min assist;Min guard Gait Distance (Feet): 180 Feet Assistive device: Rolling walker (2 wheeled) Gait Pattern/deviations: Step-to pattern;Decreased stance time - right;Antalgic     General Gait Details: max verbal cues for sequence, RW positioning, posture, step length; SPO2 94-97% on room air   Stairs             Wheelchair Mobility    Modified Rankin (Stroke Patients Only)       Balance                                            Cognition Arousal/Alertness: Awake/alert Behavior During Therapy: WFL for tasks assessed/performed Overall Cognitive Status: Within Functional Limits for tasks assessed                                 General Comments: tangential       Exercises Total Joint Exercises Ankle Circles/Pumps: AROM;Both;10 reps Quad Sets: AROM;10 reps;Both Short Arc Quad: 10 reps;Right;AAROM Heel Slides: Right;AAROM;10 reps Hip ABduction/ADduction: Right;AROM;10 reps Straight Leg Raises: AAROM;10 reps;Right    General Comments        Pertinent Vitals/Pain Pain Assessment: 0-10 Pain Score: 5  Pain Location: R knee Pain Descriptors / Indicators: Sore;Aching Pain Intervention(s): Limited activity within patient's tolerance;Repositioned;Monitored during session    Home Living                      Prior Function            PT Goals (current goals can now be found in the care plan section) Progress towards PT goals: Progressing toward goals    Frequency    7X/week      PT Plan Current plan remains appropriate    Co-evaluation              AM-PAC PT "6  Clicks" Daily Activity  Outcome Measure  Difficulty turning over in bed (including adjusting bedclothes, sheets and blankets)?: A Little Difficulty moving from lying on back to sitting on the side of the bed? : A Lot Difficulty sitting down on and standing up from a chair with arms (e.g., wheelchair, bedside commode, etc,.)?: A Lot Help needed moving to and from a bed to chair (including a wheelchair)?: A Little Help needed walking in hospital room?: A Little Help needed climbing 3-5 steps with a railing? : A Lot 6 Click Score: 15    End of Session Equipment Utilized During Treatment: Gait belt;Right knee immobilizer Activity Tolerance: Patient tolerated treatment well Patient left: with family/visitor present;with call bell/phone within reach;in chair Nurse Communication: Mobility status PT Visit  Diagnosis: Other abnormalities of gait and mobility (R26.89)     Time: 3291-9166 PT Time Calculation (min) (ACUTE ONLY): 42 min  Charges:  $Gait Training: 23-37 mins $Therapeutic Exercise: 8-22 mins                     Zenovia Jarred, PT, DPT 07/08/2018 Pager: [phone]  Maida Sale E 07/08/2018, 2:49 PM

## 2018-07-08 NOTE — Progress Notes (Signed)
Report called to SNF facility patient is going to, staff member Archie Patten took report. Patient is awaiting PTAR

## 2018-07-08 NOTE — Progress Notes (Signed)
Physical Therapy Treatment Patient Details Name: Kelly Dickson MRN: [id number] DOB: 06/19/1946 Today's Date: 07/08/2018    History of Present Illness Patient is a 72 y/o female admitted for right total knee revision.  PMH significant for NICM (last EF 35% in February 2019), paroxysmal A. fib on Eliquis, TIA in 2018, hypertension, hyperlipidemia, R TKA, L TKA,  and peripheral neuropathy.    PT Comments    Pt ambulated in hallway and assisted to bathroom.    Follow Up Recommendations  Follow surgeon's recommendation for DC plan and follow-up therapies     Equipment Recommendations  None recommended by PT    Recommendations for Other Services       Precautions / Restrictions Precautions Precautions: Knee;Fall Required Braces or Orthoses: Knee Immobilizer - Right Restrictions Other Position/Activity Restrictions: WBAT    Mobility  Bed Mobility               General bed mobility comments: pt up in recliner on arrival  Transfers Overall transfer level: Needs assistance Equipment used: Rolling walker (2 wheeled) Transfers: Sit to/from Stand Sit to Stand: Min assist         General transfer comment: assist to rise and steady, verbal cues for UE and LE positioning  Ambulation/Gait Ambulation/Gait assistance: Min assist;Min guard Gait Distance (Feet): 180 Feet Assistive device: Rolling walker (2 wheeled) Gait Pattern/deviations: Step-to pattern;Decreased stance time - right;Antalgic     General Gait Details: max verbal cues for sequence, RW positioning, posture, step length; required a few standing rest breaks   Stairs             Wheelchair Mobility    Modified Rankin (Stroke Patients Only)       Balance                                            Cognition Arousal/Alertness: Awake/alert Behavior During Therapy: WFL for tasks assessed/performed Overall Cognitive Status: Within Functional Limits for tasks assessed                                  General Comments: tangential       Exercises      General Comments        Pertinent Vitals/Pain Pain Assessment: 0-10 Pain Score: 4  Pain Location: R knee Pain Descriptors / Indicators: Sore;Aching Pain Intervention(s): Limited activity within patient's tolerance;Repositioned;Monitored during session    Home Living                      Prior Function            PT Goals (current goals can now be found in the care plan section) Progress towards PT goals: Progressing toward goals    Frequency    7X/week      PT Plan Current plan remains appropriate    Co-evaluation              AM-PAC PT "6 Clicks" Daily Activity  Outcome Measure  Difficulty turning over in bed (including adjusting bedclothes, sheets and blankets)?: A Little Difficulty moving from lying on back to sitting on the side of the bed? : Unable Difficulty sitting down on and standing up from a chair with arms (e.g., wheelchair, bedside commode, etc,.)?: Unable Help needed moving to and from  a bed to chair (including a wheelchair)?: A Little Help needed walking in hospital room?: A Little Help needed climbing 3-5 steps with a railing? : A Lot 6 Click Score: 13    End of Session Equipment Utilized During Treatment: Gait belt;Right knee immobilizer Activity Tolerance: Patient tolerated treatment well Patient left: with family/visitor present;with call bell/phone within reach;in chair   PT Visit Diagnosis: Other abnormalities of gait and mobility (R26.89)     Time: 1610-9604 PT Time Calculation (min) (ACUTE ONLY): 38 min  Charges:  $Gait Training: 23-37 mins                     Zenovia Jarred, PT, DPT 07/08/2018 Pager: [phone]  Maida Sale E 07/08/2018, 12:14 PM

## 2018-07-08 NOTE — Discharge Summary (Addendum)
Physician Discharge Summary   Patient ID: Kelly Dickson MRN: [id number] DOB/AGE: Dec 13, 1945 72 y.o.  Admit date: 07/06/2018 Discharge date: 07/08/2018  Primary Diagnosis: Failed right total knee arthroplasty  Admission Diagnoses:  Past Medical History:  Diagnosis Date  . A-fib ([place])   . Arthritis   . CHF (congestive heart failure) ([place])   . Essential hypertension   . HLD (hyperlipidemia)   . Kidney failure    D/T bACTRIM   . MR (mitral regurgitation)   . OSA (obstructive sleep apnea)    WITH NASAL PRONG   . Panic attack   . Pulmonary hypertension ([place])   . TR (tricuspid regurgitation)    Discharge Diagnoses:   Principal Problem:   Failed total knee arthroplasty ([place])  Estimated body mass index is 41.03 kg/m as calculated from the following:   Height as of this encounter: _0  (1.6 m).   Weight as of this encounter: 105.1 kg.  Procedure:  Procedure(s) (LRB): RIGHT TOTAL KNEE REVISION (Right)   Consults: None  HPI: The patient is a 72 year old female who had a right total knee arthroplasty done approximately 10 years ago. She had done well until the past year.  She started to develop pain and dysfunction.  The pain got progressively worse. She presented to the office about 2 months ago with evidence of a loose prosthesis. Workup for infection was negative. She presents now for total knee arthroplasty revision.  Laboratory Data: Admission on 07/06/2018  Component Date Value Ref Range Status  . WBC 07/07/2018 8.3  4.0 - 10.5 K/uL Final  . RBC 07/07/2018 3.45* 3.87 - 5.11 MIL/uL Final  . Hemoglobin 07/07/2018 9.3* 12.0 - 15.0 g/dL Final  . HCT 07/07/2018 30.8* 36.0 - 46.0 % Final  . MCV 07/07/2018 89.3  78.0 - 100.0 fL Final  . MCH 07/07/2018 27.0  26.0 - 34.0 pg Final  . MCHC 07/07/2018 30.2  30.0 - 36.0 g/dL Final  . RDW 07/07/2018 17.5* 11.5 - 15.5 % Final  . Platelets 07/07/2018 191  150 - 400 K/uL Final   Performed at [facility], [place]  [address] [place], [place] [address]  . Sodium 07/07/2018 145  135 - 145 mmol/L Final  . Potassium 07/07/2018 4.1  3.5 - 5.1 mmol/L Final  . Chloride 07/07/2018 110  98 - 111 mmol/L Final  . CO2 07/07/2018 26  22 - 32 mmol/L Final  . Glucose, Bld 07/07/2018 160* 70 - 99 mg/dL Final  . BUN 07/07/2018 20  8 - 23 mg/dL Final  . Creatinine, Ser 07/07/2018 0.95  0.44 - 1.00 mg/dL Final  . Calcium 07/07/2018 8.7* 8.9 - 10.3 mg/dL Final  . GFR calc non Af Amer 07/07/2018 58* >60 mL/min Final  . GFR calc Af Amer 07/07/2018 >60  >60 mL/min Final   Comment: (NOTE) The eGFR has been calculated using the CKD EPI equation. This calculation has not been validated in all clinical situations. eGFR's persistently <60 mL/min signify possible Chronic Kidney Disease.   Georgiann Hahn gap 07/07/2018 9  5 - 15 Final   Performed at [facility], [place] [address] [place], [place] [address]  . WBC 07/08/2018 10.3  4.0 - 10.5 K/uL Final  . RBC 07/08/2018 3.37* 3.87 - 5.11 MIL/uL Final  . Hemoglobin 07/08/2018 9.2* 12.0 - 15.0 g/dL Final  . HCT 07/08/2018 30.2* 36.0 - 46.0 % Final  . MCV 07/08/2018 89.6  78.0 - 100.0 fL Final  . MCH 07/08/2018 27.3  26.0 - 34.0 pg Final  . MCHC 07/08/2018 30.5  30.0 - 36.0 g/dL Final  . RDW 07/08/2018 17.9* 11.5 - 15.5 % Final  . Platelets 07/08/2018 191  150 - 400 K/uL Final   Performed at [facility], [place] [address], [place], [place] [address]  . Sodium 07/08/2018 144  135 - 145 mmol/L Final  . Potassium 07/08/2018 4.3  3.5 - 5.1 mmol/L Final  . Chloride 07/08/2018 108  98 - 111 mmol/L Final  . CO2 07/08/2018 26  22 - 32 mmol/L Final  . Glucose, Bld 07/08/2018 140* 70 - 99 mg/dL Final  . BUN 07/08/2018 29* 8 - 23 mg/dL Final  . Creatinine, Ser 07/08/2018 1.17* 0.44 - 1.00 mg/dL Final  . Calcium 07/08/2018 9.0  8.9 - 10.3 mg/dL Final  . GFR calc non Af Amer 07/08/2018 45* >60 mL/min Final  . GFR calc Af Amer 07/08/2018 53* >60 mL/min Final     Comment: (NOTE) The eGFR has been calculated using the CKD EPI equation. This calculation has not been validated in all clinical situations. eGFR's persistently <60 mL/min signify possible Chronic Kidney Disease.   Georgiann Hahn gap 07/08/2018 10  5 - 15 Final   Performed at [facility], [place] [address] [place], [place] [address]  Hospital Outpatient Visit on 06/30/2018  Component Date Value Ref Range Status  . MRSA, PCR 06/30/2018 POSITIVE* NEGATIVE Final   Comment: RESULT CALLED TO, READ BACK BY AND VERIFIED WITH: I EWURUM,RN [id number] @ 1194 BY J SCOTTON   . Staphylococcus aureus 06/30/2018 POSITIVE* NEGATIVE Final   Comment: RESULT CALLED TO, READ BACK BY AND VERIFIED WITH: I Karna Dupes [id number] @ [id number] BY J SCOTTON (NOTE) The Xpert SA Assay (FDA approved for NASAL specimens in patients 77 years of age and older), is one component of a comprehensive surveillance program. It is not intended to diagnose infection nor to guide or monitor treatment. Performed at [facility], [place] [address] [place], [place] [address]   . aPTT 06/30/2018 38* 24 - 36 seconds Final   Comment:        IF BASELINE aPTT IS ELEVATED, SUGGEST PATIENT RISK ASSESSMENT BE USED TO DETERMINE APPROPRIATE ANTICOAGULANT THERAPY. Performed at [facility], [place] [address] [place], [place] [address]   . WBC 06/30/2018 5.7  4.0 - 10.5 K/uL Final  . RBC 06/30/2018 3.85* 3.87 - 5.11 MIL/uL Final  . Hemoglobin 06/30/2018 10.2* 12.0 - 15.0 g/dL Final  . HCT 06/30/2018 33.8* 36.0 - 46.0 % Final  . MCV 06/30/2018 87.8  78.0 - 100.0 fL Final  . MCH 06/30/2018 26.5  26.0 - 34.0 pg Final  . MCHC 06/30/2018 30.2  30.0 - 36.0 g/dL Final  . RDW 06/30/2018 16.5* 11.5 - 15.5 % Final  . Platelets 06/30/2018 208  150 - 400 K/uL Final   Performed at [facility], [place] [address], [place], [place] [address]  . Sodium 06/30/2018 144  135 - 145 mmol/L Final  .  Potassium 06/30/2018 3.8  3.5 - 5.1 mmol/L Final  . Chloride 06/30/2018 107  98 - 111 mmol/L Final  . CO2 06/30/2018 27  22 - 32 mmol/L Final  . Glucose, Bld 06/30/2018 105* 70 - 99 mg/dL Final  . BUN 06/30/2018 30* 8 - 23 mg/dL Final  . Creatinine, Ser 06/30/2018 0.99  0.44 - 1.00 mg/dL Final  . Calcium 06/30/2018 9.4  8.9 - 10.3 mg/dL Final  . Total Protein  06/30/2018 7.3  6.5 - 8.1 g/dL Final  . Albumin 06/30/2018 4.2  3.5 - 5.0 g/dL Final  . AST 06/30/2018 20  15 - 41 U/L Final  . ALT 06/30/2018 19  0 - 44 U/L Final  . Alkaline Phosphatase 06/30/2018 71  38 - 126 U/L Final  . Total Bilirubin 06/30/2018 0.9  0.3 - 1.2 mg/dL Final  . GFR calc non Af Amer 06/30/2018 56* >60 mL/min Final  . GFR calc Af Amer 06/30/2018 >60  >60 mL/min Final   Comment: (NOTE) The eGFR has been calculated using the CKD EPI equation. This calculation has not been validated in all clinical situations. eGFR's persistently <60 mL/min signify possible Chronic Kidney Disease.   Georgiann Hahn gap 06/30/2018 10  5 - 15 Final   Performed at [facility], [place] [address] [place], [place] [address]  . Prothrombin Time 06/30/2018 15.5* 11.4 - 15.2 seconds Final  . INR 06/30/2018 1.24   Final   Performed at [facility], [place] [address], [place], [place] [address]  . ABO/RH(D) 06/30/2018 B POS   Final  . Antibody Screen 06/30/2018 NEG   Final  . Sample Expiration 06/30/2018 07/09/2018   Final  . Extend sample reason 06/30/2018    Final                   Value:NO TRANSFUSIONS OR PREGNANCY IN THE PAST 3 MONTHS Performed at [facility], [place] [address], [place], [place] [address]      X-Rays:Nm Bone Scan 3 Phase  Result Date: 06/13/2018 CLINICAL DATA:  RIGHT knee pain. RIGHT knee prosthetic replaced 2012. EXAM: NUCLEAR MEDICINE 3-PHASE BONE SCAN TECHNIQUE: Radionuclide angiographic images, immediate static blood pool images, and 3-hour delayed static images were  obtained of the knees after intravenous injection of radiopharmaceutical. RADIOPHARMACEUTICALS:  21.8 mCi Tc-53mMDP IV COMPARISON:  None. FINDINGS: Vascular phase: Mild increased blood flow to the RIGHT knee compared to the LEFT. Blood pool phase: There is moderate periarticular uptake in the RIGHT knee above the knee joint and below the knee joint. Mild uptake in the medial LEFT knee above the knee joint. Delayed phase: Delayed uptake in the RIGHT knee localizing to the RIGHT tibial metaphysis parallel to the prosthetic stem. IMPRESSION: Increased blood flow, blood pool activity and delayed activity in the RIGHT knee suggests early loosening. Most focal delayed activity within the RIGHT tibial metaphysis Electronically Signed   By: SSuzy BouchardM.D.   On: 06/13/2018 15:02    EKG: Orders placed or performed during the hospital encounter of 04/29/18  . EKG 12-Lead  . EKG 12-Lead     Hospital Course: ELANAYA BENNISis a 72y.o. [facility] They were brought to the operating room on 07/06/2018 and underwent Procedure(s): RIGHT TOTAL KNEE REVISION.  Patient tolerated the procedure well and was later transferred to the recovery room and then to the orthopaedic floor for postoperative care. They were given PO and IV analgesics for pain control following their surgery. They were given 24 hours of postoperative antibiotics of  Anti-infectives (From admission, onward)   Start     Dose/Rate Route Frequency Ordered Stop   07/06/18 2030  ceFAZolin (ANCEF) IVPB 2g/100 mL premix     2 g 200 mL/hr over 30 Minutes Intravenous Every 6 hours 07/06/18 1806 07/07/18 0340   07/06/18 1330  ceFAZolin (ANCEF) IVPB 2g/100 mL premix     2 g 200  mL/hr over 30 Minutes Intravenous On call to O.R. 07/06/18 1321 07/06/18 1433   07/06/18 0600  vancomycin (VANCOCIN) 1,500 mg in sodium chloride 0.9 % 500 mL IVPB     1,500 mg 250 mL/hr over 120 Minutes Intravenous On call to O.R. 07/05/18 5188  07/06/18 1613     and started on DVT prophylaxis in the form of Eliquis.   PT and OT were ordered for total joint protocol. Discharge planning consulted to help with postop disposition and equipment needs. Patient had a decent night on the evening of surgery. They started to get up OOB with therapy on POD #1. Hemovac drain was pulled without difficulty on day one. Continued to work with therapy into POD #2. Pt was seen during rounds on day two, dressing was changed and the incision was clean, dry and intact with no drainage. Pt continued to work with therapy on POD #2 and was ready to be discharged to SNF, pending bed availability.   Diet: Cardiac diet Activity: WBAT Follow-up: in 2 weeks with Dr. Wynelle Link Disposition: Skilled nursing facility Discharged Condition: stable  Repeat Labs: Check BMP within 3 days of d/c to facility to ensure creatinine is stable.   Discharge Instructions    Call MD / Call 911   Complete by:  As directed    If you experience chest pain or shortness of breath, CALL 911 and be transported to the hospital emergency room.  If you develope a fever above 101 F, pus (white drainage) or increased drainage or redness at the wound, or calf pain, call your surgeon's office.   Change dressing   Complete by:  As directed    Change the dressing daily with sterile 4 x 4 inch gauze dressing and apply TED hose.   Constipation Prevention   Complete by:  As directed    Drink plenty of fluids.  Prune juice may be helpful.  You may use a stool softener, such as Colace (over the counter) 100 mg twice a day.  Use MiraLax (over the counter) for constipation as needed.   Diet - low sodium heart healthy   Complete by:  As directed    Discharge instructions   Complete by:  As directed    Dr. Gaynelle Arabian Total Joint Specialist [facility] [address], [place], [place] [address] [phone]  TOTAL KNEE REVISION POSTOPERATIVE DIRECTIONS  Knee Rehabilitation,  Guidelines Following Surgery  Results after knee surgery are often greatly improved when you follow the exercise, range of motion and muscle strengthening exercises prescribed by your doctor. Safety measures are also important to protect the knee from further injury. Any time any of these exercises cause you to have increased pain or swelling in your knee joint, decrease the amount until you are comfortable again and slowly increase them. If you have problems or questions, call your caregiver or physical therapist for advice.   HOME CARE INSTRUCTIONS  Remove items at home which could result in a fall. This includes throw rugs or furniture in walking pathways.  ICE to the affected knee every three hours for 30 minutes at a time and then as needed for pain and swelling.  Continue to use ice on the knee for pain and swelling from surgery. You may notice swelling that will progress down to the foot and ankle.  This is normal after surgery.  Elevate the leg when you are not up walking on it.   Continue to use the breathing machine which will  help keep your temperature down.  It is common for your temperature to cycle up and down following surgery, especially at night when you are not up moving around and exerting yourself.  The breathing machine keeps your lungs expanded and your temperature down. Do not place pillow under knee, focus on keeping the knee straight while resting   DIET You may resume your previous home diet once your are discharged from the hospital.  DRESSING / WOUND CARE / SHOWERING You may shower 3 days after surgery, but keep the wounds dry during showering.  You may use an occlusive plastic wrap (Press'n Seal for example), NO SOAKING/SUBMERGING IN THE BATHTUB.  If the bandage gets wet, change with a clean dry gauze.  If the incision gets wet, pat the wound dry with a clean towel. You may start showering once you are discharged home but do not submerge the incision under water. Just pat  the incision dry and apply a dry gauze dressing on daily. Change the surgical dressing daily and reapply a dry dressing each time.  ACTIVITY Walk with your walker as instructed. Use walker as long as suggested by your caregivers. Avoid periods of inactivity such as sitting longer than an hour when not asleep. This helps prevent blood clots.  You may resume a sexual relationship in one month or when given the OK by your doctor.  You may return to work once you are cleared by your doctor.  Do not drive a car for 6 weeks or until released by you surgeon.  Do not drive while taking narcotics.  WEIGHT BEARING Weight bearing as tolerated with assist device (walker, cane, etc) as directed, use it as long as suggested by your surgeon or therapist, typically at least 4-6 weeks.  POSTOPERATIVE CONSTIPATION PROTOCOL Constipation - defined medically as fewer than three stools per week and severe constipation as less than one stool per week.  One of the most common issues patients have following surgery is constipation.  Even if you have a regular bowel pattern at home, your normal regimen is likely to be disrupted due to multiple reasons following surgery.  Combination of anesthesia, postoperative narcotics, change in appetite and fluid intake all can affect your bowels.  In order to avoid complications following surgery, here are some recommendations in order to help you during your recovery period.  Colace (docusate) - Pick up an over-the-counter form of Colace or another stool softener and take twice a day as long as you are requiring postoperative pain medications.  Take with a full glass of water daily.  If you experience loose stools or diarrhea, hold the colace until you stool forms back up.  If your symptoms do not get better within 1 week or if they get worse, check with your doctor.  Dulcolax (bisacodyl) - Pick up over-the-counter and take as directed by the product packaging as needed to assist  with the movement of your bowels.  Take with a full glass of water.  Use this product as needed if not relieved by Colace only.   MiraLax (polyethylene glycol) - Pick up over-the-counter to have on hand.  MiraLax is a solution that will increase the amount of water in your bowels to assist with bowel movements.  Take as directed and can mix with a glass of water, juice, soda, coffee, or tea.  Take if you go more than two days without a movement. Do not use MiraLax more than once per day. Call your doctor if you  are still constipated or irregular after using this medication for 7 days in a row.  If you continue to have problems with postoperative constipation, please contact the office for further assistance and recommendations.  If you experience "the worst abdominal pain ever" or develop nausea or vomiting, please contact the office immediatly for further recommendations for treatment.  ITCHING  If you experience itching with your medications, try taking only a single pain pill, or even half a pain pill at a time.  You can also use Benadryl over the counter for itching or also to help with sleep.   TED HOSE STOCKINGS Wear the elastic stockings on both legs for three weeks following surgery during the day but you may remove then at night for sleeping.  MEDICATIONS See your medication summary on the "After Visit Summary" that the nursing staff will review with you prior to discharge.  You may have some home medications which will be placed on hold until you complete the course of blood thinner medication.  It is important for you to complete the blood thinner medication as prescribed by your surgeon.  Continue your approved medications as instructed at time of discharge.  PRECAUTIONS If you experience chest pain or shortness of breath - call 911 immediately for transfer to the hospital emergency department.  If you develop a fever greater that 101 F, purulent drainage from wound, increased redness  or drainage from wound, foul odor from the wound/dressing, or calf pain - CONTACT YOUR SURGEON.                                                   FOLLOW-UP APPOINTMENTS Make sure you keep all of your appointments after your operation with your surgeon and caregivers. You should call the office at the above phone number and make an appointment for approximately two weeks after the date of your surgery or on the date instructed by your surgeon outlined in the "After Visit Summary".   RANGE OF MOTION AND STRENGTHENING EXERCISES  Rehabilitation of the knee is important following a knee injury or an operation. After just a few days of immobilization, the muscles of the thigh which control the knee become weakened and shrink (atrophy). Knee exercises are designed to build up the tone and strength of the thigh muscles and to improve knee motion. Often times heat used for twenty to thirty minutes before working out will loosen up your tissues and help with improving the range of motion but do not use heat for the first two weeks following surgery. These exercises can be done on a training (exercise) mat, on the floor, on a table or on a bed. Use what ever works the best and is most comfortable for you Knee exercises include:  Leg Lifts - While your knee is still immobilized in a splint or cast, you can do straight leg raises. Lift the leg to 60 degrees, hold for 3 sec, and slowly lower the leg. Repeat 10-20 times 2-3 times daily. Perform this exercise against resistance later as your knee gets better.  Quad and Hamstring Sets - Tighten up the muscle on the front of the thigh (Quad) and hold for 5-10 sec. Repeat this 10-20 times hourly. Hamstring sets are done by pushing the foot backward against an object and holding for 5-10 sec. Repeat as with quad sets.  Leg Slides: Lying on your back, slowly slide your foot toward your buttocks, bending your knee up off the floor (only go as far as is comfortable). Then slowly  slide your foot back down until your leg is flat on the floor again. Angel Wings: Lying on your back spread your legs to the side as far apart as you can without causing discomfort.  A rehabilitation program following serious knee injuries can speed recovery and prevent re-injury in the future due to weakened muscles. Contact your doctor or a physical therapist for more information on knee rehabilitation.   IF YOU ARE TRANSFERRED TO A SKILLED REHAB FACILITY If the patient is transferred to a skilled rehab facility following release from the hospital, a list of the current medications will be sent to the facility for the patient to continue.  When discharged from the skilled rehab facility, please have the facility set up the patient's [place] prior to being released. Also, the skilled facility will be responsible for providing the patient with their medications at time of release from the facility to include their pain medication, the muscle relaxants, and their blood thinner medication. If the patient is still at the rehab facility at time of the two week follow up appointment, the skilled rehab facility will also need to assist the patient in arranging follow up appointment in our office and any transportation needs.  MAKE SURE YOU:  Understand these instructions.  Get help right away if you are not doing well or get worse.    Pick up stool softner and laxative for home use following surgery while on pain medications. Do not submerge incision under water. Please use good hand washing techniques while changing dressing each day. May shower starting three days after surgery. Please use a clean towel to pat the incision dry following showers. Continue to use ice for pain and swelling after surgery. Do not use any lotions or creams on the incision until instructed by your surgeon.   Do not put a pillow under the knee. Place it under the heel.   Complete by:  As directed     Driving restrictions   Complete by:  As directed    No driving for two weeks   TED hose   Complete by:  As directed    Use stockings (TED hose) for three weeks on both leg(s).  You may remove them at night for sleeping.   Weight bearing as tolerated   Complete by:  As directed      Allergies as of 07/08/2018      Reactions   Sulfamethoxazole-trimethoprim Other (See Comments)   Kidney failure      Medication List    STOP taking these medications   aspirin 81 MG chewable tablet     TAKE these medications   acetaminophen 650 MG CR tablet Commonly known as:  TYLENOL Take 1,300 mg by mouth every 8 (eight) hours as needed for pain.   BIOFREEZE EX Apply 1 application topically 2 (two) times daily as needed (pain).   calcium-vitamin D 500-200 MG-UNIT tablet Take 1 tablet by mouth every other day.   CoQ10 100 MG Caps Take 100 mg by mouth every other day.   DULoxetine 30 MG capsule Commonly known as:  CYMBALTA Take 60 mg by mouth daily.   ELIQUIS 5 MG Tabs tablet Generic drug:  apixaban TAKE 1 TABLET BY MOUTH 2 TIMES A DAY   Fish Oil 1000 MG Caps Take 1,000 mg  by mouth every other day.   furosemide 40 MG tablet Commonly known as:  LASIX Take 40 mg tab, twice a day until weight is at 232 lbs, then decrease to 40 mg daily. If wt is or above 235 take extra 40 mg tab. What changed:    how much to take  how to take this  when to take this   gabapentin 300 MG capsule Commonly known as:  NEURONTIN Take 1 capsule (300 mg total) by mouth 2 (two) times daily. What changed:  when to take this   lactobacillus acidophilus & bulgar chewable tablet Chew 3 tablets by mouth every other day.   LECITHIN PO Take 2 tablets by mouth every other day.   loratadine 10 MG tablet Commonly known as:  CLARITIN Take 10 mg by mouth daily as needed for allergies.   methocarbamol 500 MG tablet Commonly known as:  ROBAXIN Take 1 tablet (500 mg total) by mouth every 6 (six) hours as  needed for muscle spasms.   multivitamin with minerals tablet Take 1 tablet by mouth every other day.   oxyCODONE 5 MG immediate release tablet Commonly known as:  Oxy IR/ROXICODONE Take 1-2 tablets (5-10 mg total) by mouth every 6 (six) hours as needed for moderate pain (pain score 4-6).   Red Yeast Rice 600 MG Caps Take 600 mg by mouth every other day.   rosuvastatin 40 MG tablet Commonly known as:  CRESTOR Take 1 tablet (40 mg total) by mouth daily at 6 PM.   sacubitril-valsartan 97-103 MG Commonly known as:  ENTRESTO Take 1 tablet by mouth 2 (two) times daily.   SALONPAS ARTHRITIS PAIN RELIEF EX Place 1 patch onto the skin daily as needed (pain).   senna 8.6 MG Tabs tablet Commonly known as:  SENOKOT Take 1 tablet (8.6 mg total) by mouth 2 (two) times daily. What changed:    when to take this  reasons to take this   Vitamin C/Bioflavonoids 1000-25 MG Tabs Take 1 tablet by mouth every other day.   vitamin E 400 UNIT capsule Take 400 Units by mouth every other day.            Discharge Care Instructions  (From admission, onward)         Start     Ordered   07/08/18 0000  Weight bearing as tolerated     07/08/18 0719   07/08/18 0000  Change dressing    Comments:  Change the dressing daily with sterile 4 x 4 inch gauze dressing and apply TED hose.   07/08/18 0719         Follow-up Information    Gaynelle Arabian, MD. Schedule an appointment as soon as possible for a visit on 07/21/2018.   Specialty:  Orthopedic Surgery Contact information: [address] [place] [address] [phone]           Signed: Theresa Duty, PA-C Orthopedic Surgery 07/08/2018, 7:20 AM

## 2018-07-08 NOTE — Progress Notes (Signed)
Pt admitting to [facility] room 7009- report [phone]. DC summary provided. Both pt's daughters aware and agreeable. [facility] transportation arranged.  Ilean Skill, MSW, LCSW Clinical Social Work 07/08/2018 [phone] coverage for [phone]

## 2018-07-25 ENCOUNTER — Encounter (HOSPITAL_COMMUNITY): Payer: Medicare Other | Admitting: Internal Medicine

## 2018-08-03 ENCOUNTER — Other Ambulatory Visit: Payer: Self-pay

## 2018-08-03 ENCOUNTER — Emergency Department (HOSPITAL_BASED_OUTPATIENT_CLINIC_OR_DEPARTMENT_OTHER): Payer: Medicare Other

## 2018-08-03 ENCOUNTER — Emergency Department (HOSPITAL_COMMUNITY): Payer: Medicare Other

## 2018-08-03 ENCOUNTER — Emergency Department (HOSPITAL_COMMUNITY)
Admission: EM | Admit: 2018-08-03 | Discharge: 2018-08-03 | Disposition: A | Discharge status: Home / Self Care | Payer: Medicare Other | Attending: Emergency Medicine | Admitting: Emergency Medicine

## 2018-08-03 ENCOUNTER — Encounter (HOSPITAL_COMMUNITY): Payer: Self-pay | Admitting: Emergency Medicine

## 2018-08-03 DIAGNOSIS — Z79899 Other long term (current) drug therapy: Secondary | ICD-10-CM | POA: Insufficient documentation

## 2018-08-03 DIAGNOSIS — R0602 Shortness of breath: Secondary | ICD-10-CM | POA: Diagnosis present

## 2018-08-03 DIAGNOSIS — I110 Hypertensive heart disease with heart failure: Secondary | ICD-10-CM | POA: Insufficient documentation

## 2018-08-03 DIAGNOSIS — I5022 Chronic systolic (congestive) heart failure: Secondary | ICD-10-CM | POA: Diagnosis not present

## 2018-08-03 DIAGNOSIS — R609 Edema, unspecified: Secondary | ICD-10-CM | POA: Diagnosis not present

## 2018-08-03 DIAGNOSIS — Z96653 Presence of artificial knee joint, bilateral: Secondary | ICD-10-CM | POA: Insufficient documentation

## 2018-08-03 DIAGNOSIS — R0600 Dyspnea, unspecified: Secondary | ICD-10-CM | POA: Diagnosis not present

## 2018-08-03 LAB — BASIC METABOLIC PANEL
ANION GAP: 9 (ref 5–15)
BUN: 34 mg/dL — AB (ref 8–23)
CHLORIDE: 108 mmol/L (ref 98–111)
CO2: 25 mmol/L (ref 22–32)
Calcium: 8.8 mg/dL — ABNORMAL LOW (ref 8.9–10.3)
Creatinine, Ser: 1.43 mg/dL — ABNORMAL HIGH (ref 0.44–1.00)
GFR calc Af Amer: 41 mL/min — ABNORMAL LOW (ref >60)
GFR, EST NON AFRICAN AMERICAN: 35 mL/min — AB (ref >60)
GLUCOSE: 100 mg/dL — AB (ref 70–99)
POTASSIUM: 4.2 mmol/L (ref 3.5–5.1)
Sodium: 142 mmol/L (ref 135–145)

## 2018-08-03 LAB — CBC
HEMATOCRIT: 30.1 % — AB (ref 36.0–46.0)
HEMOGLOBIN: 8.7 g/dL — AB (ref 12.0–15.0)
MCH: 25.9 pg — ABNORMAL LOW (ref 26.0–34.0)
MCHC: 28.9 g/dL — AB (ref 30.0–36.0)
MCV: 89.6 fL (ref 78.0–100.0)
Platelets: 293 10*3/uL (ref 150–400)
RBC: 3.36 MIL/uL — ABNORMAL LOW (ref 3.87–5.11)
RDW: 18.5 % — AB (ref 11.5–15.5)
WBC: 6 10*3/uL (ref 4.0–10.5)

## 2018-08-03 MED ORDER — ACETAMINOPHEN 500 MG PO TABS
1000.0000 mg | ORAL_TABLET | Freq: Once | ORAL | Status: AC
  Administered 2018-08-03: 1000 mg via ORAL
  Filled 2018-08-03: qty 2

## 2018-08-03 MED ORDER — GABAPENTIN 300 MG PO CAPS
300.0000 mg | ORAL_CAPSULE | Freq: Once | ORAL | Status: AC
  Administered 2018-08-03: 300 mg via ORAL
  Filled 2018-08-03: qty 1

## 2018-08-03 MED ORDER — IOPAMIDOL (ISOVUE-370) INJECTION 76%
100.0000 mL | Freq: Once | INTRAVENOUS | Status: AC | PRN
  Administered 2018-08-03: 75 mL via INTRAVENOUS

## 2018-08-03 MED ORDER — IOPAMIDOL (ISOVUE-370) INJECTION 76%
INTRAVENOUS | Status: AC
  Filled 2018-08-03: qty 100

## 2018-08-03 NOTE — Discharge Instructions (Addendum)
The testing today is reassuring.  There is no sign of blood clot in the lungs or leg.  Continue your usual rehab and medications as prescribed.  Return here, if needed, for problems.

## 2018-08-03 NOTE — ED Triage Notes (Signed)
Pt from PCP for chest palpitations. She has a hx of afib. She reports some SOB. Recent knee surgery. Pt is alert and oriented.

## 2018-08-03 NOTE — ED Notes (Signed)
Pt back from vascular.

## 2018-08-03 NOTE — ED Notes (Signed)
Pt transported to vascular.  °

## 2018-08-03 NOTE — ED Notes (Signed)
Pt back from CT

## 2018-08-03 NOTE — ED Provider Notes (Signed)
[facility] EMERGENCY DEPARTMENT Provider Note   CSN: [id number] Arrival date & time: 08/03/18  1037     History   Chief Complaint Chief Complaint  Patient presents with  . Palpitations    HPI Kelly Dickson is a 72 y.o. female.  HPI   She presents for evaluation of shortness of breath.  She went to see her PCP today for follow-up post right knee replacement surgery about a month ago.  PCP was concerned about right leg swelling and shortness of breath so advised that she come here for evaluation.  She came by private vehicle.  She complains of ongoing symptoms.  No recent change in related to eat.  No cough.  No chest pain.  She is undergoing therapy for right knee replacement.  She saw her orthopedist, while she was in rehab post surgery.  According to notes in epic, the provider today was concerned about presence of left bundle branch block, and the possibility of DVT/PE.  Patient continues to take her Eliquis as prescribed.  There are no other no modifying factors.  Past Medical History:  Diagnosis Date  . A-fib (HCC)   . Arthritis   . CHF (congestive heart failure) (HCC)   . Essential hypertension   . HLD (hyperlipidemia)   . Kidney failure    D/T bACTRIM   . MR (mitral regurgitation)   . OSA (obstructive sleep apnea)    WITH NASAL PRONG   . Panic attack   . Pulmonary hypertension (HCC)   . TR (tricuspid regurgitation)     Patient Active Problem List   Diagnosis Date Noted  . 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 Acute metabolic encephalopathy 04/13/2018  . Acute kidney injury (HCC)   . Hyperkalemia   . Chronic systolic CHF (congestive heart failure) (HCC)   . AKI (acute kidney injury) (HCC) 04/12/2018  . TIA (transient ischemic attack) 04/27/2017  . Bradycardia 01/09/2016  . PAF (paroxysmal atrial fibrillation) (HCC)   . LBBB (left bundle branch block) 12/22/2015  . Leg swelling 11/25/2015  . Cellulitis of leg, left 11/25/2015   . Panic attack   . CHF (congestive heart failure) (HCC)   . Essential hypertension   . MR (mitral regurgitation)   . TR (tricuspid regurgitation)   . Pulmonary hypertension (HCC)   . HLD (hyperlipidemia)     Past Surgical History:  Procedure Laterality Date  . CARDIOVERSION N/A 12/25/2015   Procedure: CARDIOVERSION;  Surgeon: Dolores Patty, MD;  Location: [facility] ENDOSCOPY;  Service: Cardiovascular;  Laterality: N/A;  . CARDIOVERSION N/A 01/04/2018   Procedure: CARDIOVERSION;  Surgeon: Dolores Patty, MD;  Location[facility] ENDOSCOPY;  Service: Cardiovascular;  Laterality: N/A;  . HERNIA REPAIR    . REPLACEMENT TOTAL KNEE Right 2009  . TONSILLECTOMY    . TOTAL KNEE ARTHROPLASTY Left   . TOTAL KNEE REVISION Right 07/06/2018   Procedure: RIGHT TOTAL KNEE REVISION;  Surgeon: Ollen Gross, MD;  Location: [facility] ORS;  Service: Orthopedics;  Laterality: Right;      OB History   None      Home Medications    Prior to Admission medications   Medication Sig Start Date End Date Taking? Authorizing Provider  acetaminophen (TYLENOL) 650 MG CR tablet Take 1,300 mg by mouth every 8 (eight) hours as needed for pain.     [provider]  Bioflavonoid Products (VITAMIN C/BIOFLAVONOIDS) 1000-25 MG TABS Take 1 tablet by mouth every other day.  [provider]  Calcium Carbonate-Vitamin D (CALCIUM-VITAMIN D) 500-200 MG-UNIT tablet Take 1 tablet by mouth every other day.     [provider]  Coenzyme Q10 (COQ10) 100 MG CAPS Take 100 mg by mouth every other day.     [provider]  DULoxetine (CYMBALTA) 30 MG capsule Take 60 mg by mouth daily.     [provider]  ELIQUIS 5 MG TABS tablet TAKE 1 TABLET BY MOUTH 2 TIMES A DAY 10/28/17   Bensimhon, Bevelyn Buckles, MD  furosemide (LASIX) 40 MG tablet Take 40 mg tab, twice a day until weight is at 232 lbs, then decrease to 40 mg daily. If wt is or above 235 take extra 40 mg tab. Patient taking differently:  Take 40 mg by mouth daily. Take 40 mg tab, twice a day until weight is at 232 lbs, then decrease to 40 mg daily. If wt is or above 235 take extra 40 mg tab. 04/29/18   Bensimhon, Bevelyn Buckles, MD  gabapentin (NEURONTIN) 300 MG capsule Take 1 capsule (300 mg total) by mouth 2 (two) times daily. Patient taking differently: Take 300 mg by mouth 3 (three) times daily.  04/14/18   Rodolph Bong, MD  lactobacillus acidophilus & bulgar (LACTINEX) chewable tablet Chew 3 tablets by mouth every other day.     [provider]  LECITHIN PO Take 2 tablets by mouth every other day.     [provider]  Liniments [facility] ARTHRITIS PAIN RELIEF EX) Place 1 patch onto the skin daily as needed (pain).    [provider]  loratadine (CLARITIN) 10 MG tablet Take 10 mg by mouth daily as needed for allergies.     [provider]  Menthol, Topical Analgesic, (BIOFREEZE EX) Apply 1 application topically 2 (two) times daily as needed (pain).    [provider]  methocarbamol (ROBAXIN) 500 MG tablet Take 1 tablet (500 mg total) by mouth every 6 (six) hours as needed for muscle spasms. 07/08/18   Edmisten, Lyn Hollingshead, PA  Multiple Vitamins-Minerals (MULTIVITAMIN WITH MINERALS) tablet Take 1 tablet by mouth every other day.     [provider]  Omega-3 Fatty Acids (FISH OIL) 1000 MG CAPS Take 1,000 mg by mouth every other day.     [provider]  oxyCODONE (OXY IR/ROXICODONE) 5 MG immediate release tablet Take 1-2 tablets (5-10 mg total) by mouth every 6 (six) hours as needed for moderate pain (pain score 4-6). 07/08/18   Edmisten, Kristie L, PA  Red Yeast Rice 600 MG CAPS Take 600 mg by mouth every other day.     [provider]  rosuvastatin (CRESTOR) 40 MG tablet Take 1 tablet (40 mg total) by mouth daily at 6 PM. 07/08/18   Edmisten, Kristie L, PA  sacubitril-valsartan (ENTRESTO) 97-103 MG Take 1 tablet by mouth 2 (two) times daily. 04/29/18   Bensimhon, Bevelyn Buckles, MD  senna (SENOKOT) 8.6 MG TABS tablet Take 1 tablet (8.6 mg total) by mouth 2 (two) times daily. Patient taking differently: Take 1 tablet by mouth daily as needed for moderate constipation.  04/14/18   Rodolph Bong, MD  vitamin E 400 UNIT capsule Take 400 Units by mouth every other day.     [provider]    Family History Family History  Problem Relation Age of Onset  . Breast cancer Mother   . Hypertension Sister     Social History Social History   Tobacco Use  .  Smoking status: Never Smoker  . Smokeless tobacco: Never Used  Substance Use Topics  . Alcohol use: No  . Drug use: No     Allergies   Sulfamethoxazole-trimethoprim   Review of Systems Review of Systems  All other systems reviewed and are negative.    Physical Exam Updated Vital Signs BP 112/65   Pulse 86   Temp 98.1 F (36.7 C) (Oral)   Resp (!) 27   Ht 5\' 3"  (1.6 m)   Wt 105.2 kg   SpO2 100%   BMI 41.10 kg/m   Physical Exam  Constitutional: She is oriented to person, place, and time. She appears well-developed. No distress.  Elderly, frail  HENT:  Head: Normocephalic and atraumatic.  Eyes: Pupils are equal, round, and reactive to light. Conjunctivae and EOM are normal.  Neck: Normal range of motion and phonation normal. Neck supple.  Cardiovascular: Normal rate and regular rhythm.  Pulmonary/Chest: Effort normal and breath sounds normal. She exhibits no tenderness.  Musculoskeletal:  Entire right leg swollen.  Healing surgical site anterior knee.  Minimal erythema surrounding surgical site is nonspecific and does not indicate cellulitis.  Neurological: She is alert and oriented to person, place, and time. She exhibits normal muscle tone.  Skin: Skin is warm and dry.  Psychiatric: She has a normal mood and affect. Her behavior is normal. Judgment and thought content normal.  Nursing note and vitals reviewed.    ED Treatments / Results  Labs (all labs ordered are  listed, but only abnormal results are displayed) Labs Reviewed  BASIC METABOLIC PANEL - Abnormal; Notable for the following components:      Result Value   Glucose, Bld 100 (*)    BUN 34 (*)    Creatinine, Ser 1.43 (*)    Calcium 8.8 (*)    GFR calc non Af Amer 35 (*)    GFR calc Af Amer 41 (*)    All other components within normal limits  CBC - Abnormal; Notable for the following components:   RBC 3.36 (*)    Hemoglobin 8.7 (*)    HCT 30.1 (*)    MCH 25.9 (*)    MCHC 28.9 (*)    RDW 18.5 (*)    All other components within normal limits    EKG EKG Interpretation  Date/Time:  Wednesday August 03 2018 10:47:34 EDT Ventricular Rate:  95 PR Interval:    QRS Duration: 158 QT Interval:  360 QTC Calculation: 452 R Axis:   74 Text Interpretation:  Atrial fibrillation Left bundle branch block Abnormal ECG since last tracing no significant change Confirmed by Mancel Bale [phone]) on 08/03/2018 12:16:20 PM Also confirmed by Mancel Bale [phone]), editor Barbette Hair [phone])  on 08/03/2018 3:28:34 PM   Radiology Ct Angio Chest Pe W/cm &/or Wo Cm  Result Date: 08/03/2018 CLINICAL DATA:  Chest palpitations, atrial fibrillation, some shortness of breath, recent knee surgery EXAM: CT ANGIOGRAPHY CHEST WITH CONTRAST TECHNIQUE: Multidetector CT imaging of the chest was performed using the standard protocol during bolus administration of intravenous contrast. Multiplanar CT image reconstructions and MIPs were obtained to evaluate the vascular anatomy. CONTRAST:  75mL ISOVUE-370 IOPAMIDOL (ISOVUE-370) INJECTION 76% COMPARISON:  Chest x-ray of 08/03/2017 FINDINGS: Cardiovascular: The pulmonary arteries are well opacified. There is no evidence of acute pulmonary embolism. The thoracic aorta is not well opacified and there is evidence of moderate thoracic aortic atherosclerosis present. There is cardiomegaly present and diffuse coronary artery calcifications are noted. No pericardial effusion is  seen. Considerable mitral annular calcification is evident. Mediastinum/Nodes: No mediastinal or hilar adenopathy is seen. The thyroid gland contains several low-attenuation nodules of questionable significance in this age patient. There is a small hiatal hernia present. Lungs/Pleura: On lung window images, there is minimal linear scarring in the right middle lobe and lingula. No pneumonia or effusion is seen. No suspicious lung nodule is evident. The central airway is patent. Upper Abdomen: There is some reflux of contrast into the hepatic veins. On the images through the upper abdomen obtained, no significant abnormality is seen. There does appear to be fatty infiltration of the pancreas. Musculoskeletal: The thoracic vertebrae are in in normal alignment with degenerative changes in the mid to lower thoracic spine. Review of the MIP images confirms the above findings. IMPRESSION: 1. No evidence of acute pulmonary embolism. No pneumonia or effusion is seen. 2. Cardiomegaly with diffuse coronary artery calcifications and moderate thoracic aortic atherosclerosis. 3. Small hiatal hernia. Electronically Signed   By: Dwyane Dee M.D.   On: 08/03/2018 15:27    Procedures Procedures (including critical care time)  Medications Ordered in ED Medications  gabapentin (NEURONTIN) capsule 300 mg (300 mg Oral Given 08/03/18 1434)  acetaminophen (TYLENOL) tablet 1,000 mg (1,000 mg Oral Given 08/03/18 1341)  iopamidol (ISOVUE-370) 76 % injection 100 mL (75 mLs Intravenous Contrast Given 08/03/18 1511)     Initial Impression / Assessment and Plan / ED Course  I have reviewed the triage vital signs and the nursing notes.  Pertinent labs & imaging results that were available during my care of the patient were reviewed by me and considered in my medical decision making (see chart for details).  Clinical Course as of Aug 05 1428  Wed Aug 03, 2018  1530 Normal except mild elevation BUN and creatinine, decreased  calcium, decreased GFR  Basic metabolic panel(!) [EW]  1531 Normal Except hemoglobin low 8.7, normal platelets.  CBC(!) [EW]  1531 No PE or infiltrate, images reviewed by me  CT Angio Chest PE W/Cm &/Or Wo Cm [EW]    Clinical Course User Index [EW] Mancel Bale, MD     No data found.  3:43 PM Reevaluation with update and discussion. After initial assessment and treatment, an updated evaluation reveals patient is comfortable, no further complaints.  Findings discussed with patient and family members, all questions answered. Mancel Bale   Medical Decision Making: Nonspecific shortness of breath with reassuring evaluation.  Doubt complications of surgical site, DVT, PE or pneumonia.  CRITICAL CARE-no Performed by: Mancel Bale  Nursing Notes Reviewed/ Care Coordinated Applicable Imaging Reviewed Interpretation of Laboratory Data incorporated into ED treatment  The patient appears reasonably screened and/or stabilized for discharge and I doubt any other medical condition or other [facility] requiring further screening, evaluation, or treatment in the ED at this time prior to discharge.  Plan: Home Medications-continue usual medications; Home Treatments-rest, usual diet; return here if the recommended treatment, does not improve the symptoms; Recommended follow up-PCP and orthopedics PRN   Final Clinical Impressions(s) / ED Diagnoses   Final diagnoses:  Dyspnea, unspecified type    ED Discharge Orders    None       Mancel Bale, MD 08/05/18 1430

## 2018-08-03 NOTE — Progress Notes (Signed)
*  Preliminary Results* Right lower extremity venous duplex completed. Right lower extremity is negative for deep vein thrombosis. There is no evidence of right Baker's cyst.  08/03/2018 2:07 PM  Blanch Media

## 2018-08-29 ENCOUNTER — Other Ambulatory Visit (HOSPITAL_COMMUNITY): Payer: Self-pay | Admitting: Internal Medicine

## 2018-09-22 ENCOUNTER — Encounter (HOSPITAL_COMMUNITY): Payer: Medicare Other | Admitting: Internal Medicine

## 2018-10-13 ENCOUNTER — Other Ambulatory Visit (HOSPITAL_COMMUNITY): Payer: Self-pay | Admitting: Internal Medicine

## 2019-03-29 ENCOUNTER — Other Ambulatory Visit (HOSPITAL_COMMUNITY): Payer: Self-pay | Admitting: Internal Medicine

## 2019-05-22 IMAGING — NM NM BONE 3 PHASE
8 series · 18 of 18 positions shown · non-contrast
Comparison: None.

CLINICAL DATA: RIGHT knee pain. RIGHT knee prosthetic replaced
0150.

EXAM:
NUCLEAR MEDICINE 3-PHASE BONE SCAN
TECHNIQUE: Radionuclide angiographic images, immediate static blood pool
images, and 3-hour delayed static images were obtained of the knees
after intravenous injection of radiopharmaceutical.
RADIOPHARMACEUTICALS:  21.8 mCi 0c-FFm MDP IV

[Series 1: flow · 2.07mm/px · 6 of 48 frames shown (1 of 2)]
[frame 5/48]
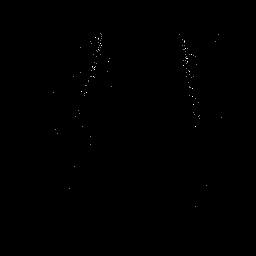
[frame 13/48  full-range]
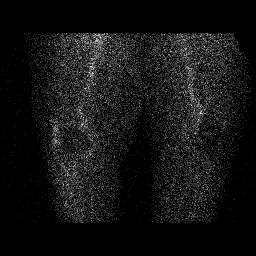
[frame 21/48  full-range]
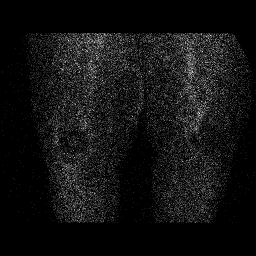
[frame 29/48  full-range]
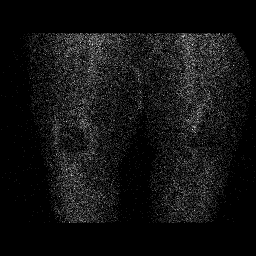
[frame 37/48  full-range]
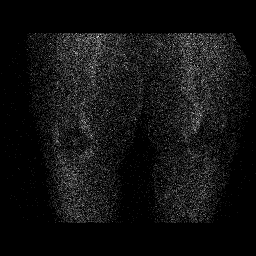
[frame 45/48  full-range]
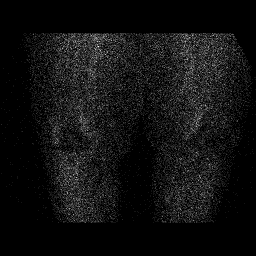

[Series 1: flow · 2.07mm/px · 6 of 48 frames shown (2 of 2)]
[frame 5/48]
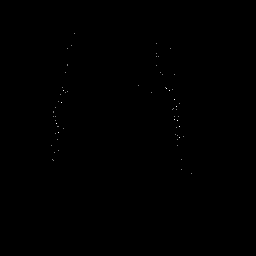
[frame 13/48  full-range]
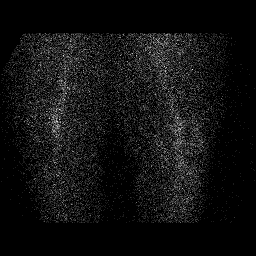
[frame 21/48  full-range]
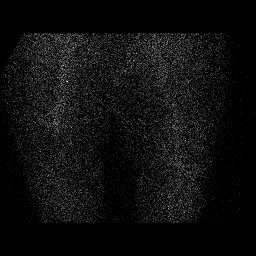
[frame 29/48  full-range]
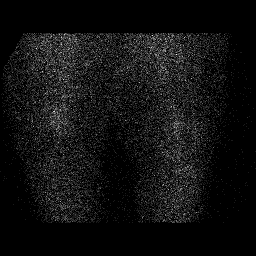
[frame 37/48  full-range]
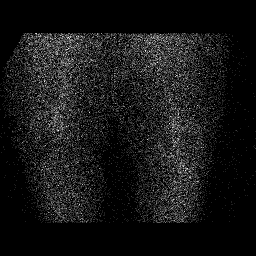
[frame 45/48  full-range]
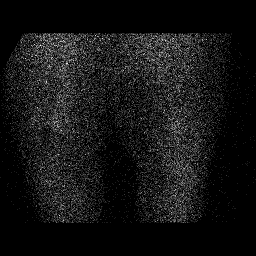

[Series 1: delay · delayed · 2.07mm/px · 1 of 1 slices shown (1 of 2)]
[im 1/1]
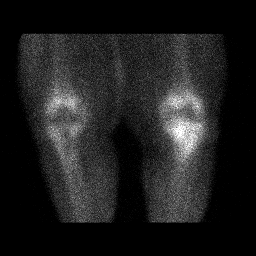

[Series 2: blood pool · 2.07mm/px · 1 of 1 slices shown (1 of 2)]
[im 1/1  full-range]
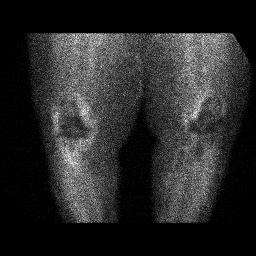

[Series 2: delay · delayed · 2.07mm/px · 1 of 1 slices shown (2 of 2)]
[im 1/1]
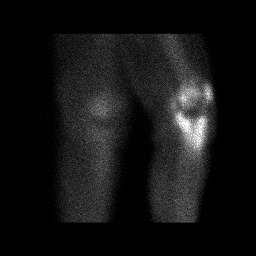

[Series 2: blood pool · 2.07mm/px · 1 of 1 slices shown (2 of 2)]
[im 1/1  full-range]
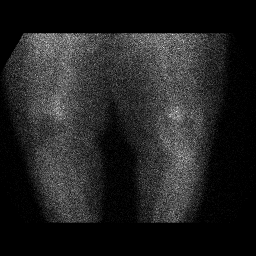

[Series 3: lat bp · 2.07mm/px · 1 of 1 slices shown (1 of 2)]
[im 1/1  full-range]
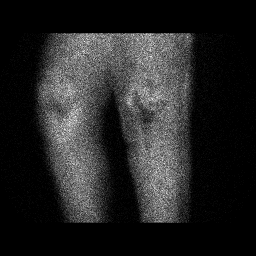

[Series 3: lat bp · 2.07mm/px · 1 of 1 slices shown (2 of 2)]
[im 1/1  full-range]
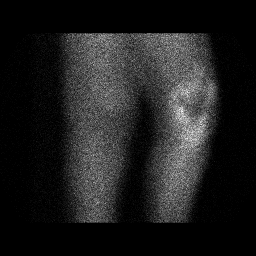

[18 of 18 positions shown; findings below may reference images not displayed]

FINDINGS: Vascular phase: Mild increased blood flow to the RIGHT knee compared
to the LEFT.

Blood pool phase: There is moderate periarticular uptake in the
RIGHT knee above the knee joint and below the knee joint. Mild
uptake in the medial LEFT knee above the knee joint.

Delayed phase: Delayed uptake in the RIGHT knee localizing to the
RIGHT tibial metaphysis parallel to the prosthetic stem.
IMPRESSION: Increased blood flow, blood pool activity and delayed activity in
the RIGHT knee suggests early loosening. Most focal delayed activity
within the RIGHT tibial metaphysis

## 2022-02-18 ENCOUNTER — Emergency Department (HOSPITAL_BASED_OUTPATIENT_CLINIC_OR_DEPARTMENT_OTHER)
Admission: EM | Admit: 2022-02-18 | Discharge: 2022-02-18 | Disposition: A | Discharge status: Other Acute Inpatient Hospital | Payer: Medicare PPO | Attending: Emergency Medicine | Admitting: Emergency Medicine

## 2022-02-18 ENCOUNTER — Emergency Department (HOSPITAL_BASED_OUTPATIENT_CLINIC_OR_DEPARTMENT_OTHER): Payer: Medicare PPO

## 2022-02-18 ENCOUNTER — Encounter (HOSPITAL_BASED_OUTPATIENT_CLINIC_OR_DEPARTMENT_OTHER): Payer: Self-pay

## 2022-02-18 ENCOUNTER — Other Ambulatory Visit: Payer: Self-pay

## 2022-02-18 DIAGNOSIS — I110 Hypertensive heart disease with heart failure: Secondary | ICD-10-CM | POA: Diagnosis not present

## 2022-02-18 DIAGNOSIS — G459 Transient cerebral ischemic attack, unspecified: Secondary | ICD-10-CM | POA: Diagnosis not present

## 2022-02-18 DIAGNOSIS — R7309 Other abnormal glucose: Secondary | ICD-10-CM | POA: Diagnosis not present

## 2022-02-18 DIAGNOSIS — I509 Heart failure, unspecified: Secondary | ICD-10-CM | POA: Insufficient documentation

## 2022-02-18 DIAGNOSIS — R410 Disorientation, unspecified: Secondary | ICD-10-CM | POA: Diagnosis present

## 2022-02-18 DIAGNOSIS — R7989 Other specified abnormal findings of blood chemistry: Secondary | ICD-10-CM | POA: Diagnosis not present

## 2022-02-18 LAB — CBC WITH DIFFERENTIAL/PLATELET
Abs Immature Granulocytes: 0.06 10*3/uL (ref 0.00–0.07)
Basophils Absolute: 0.1 10*3/uL (ref 0.0–0.1)
Basophils Relative: 1 %
Eosinophils Absolute: 0.1 10*3/uL (ref 0.0–0.5)
Eosinophils Relative: 2 %
HCT: 37.8 % (ref 36.0–46.0)
Hemoglobin: 11.9 g/dL — ABNORMAL LOW (ref 12.0–15.0)
Immature Granulocytes: 1 %
Lymphocytes Relative: 21 %
Lymphs Abs: 1.7 10*3/uL (ref 0.7–4.0)
MCH: 29.3 pg (ref 26.0–34.0)
MCHC: 31.5 g/dL (ref 30.0–36.0)
MCV: 93.1 fL (ref 80.0–100.0)
Monocytes Absolute: 0.7 10*3/uL (ref 0.1–1.0)
Monocytes Relative: 9 %
Neutro Abs: 5.3 10*3/uL (ref 1.7–7.7)
Neutrophils Relative %: 66 %
Platelets: 201 10*3/uL (ref 150–400)
RBC: 4.06 MIL/uL (ref 3.87–5.11)
RDW: 15.1 % (ref 11.5–15.5)
WBC: 7.9 10*3/uL (ref 4.0–10.5)
nRBC: 0 % (ref 0.0–0.2)

## 2022-02-18 LAB — URINALYSIS, MICROSCOPIC (REFLEX)

## 2022-02-18 LAB — COMPREHENSIVE METABOLIC PANEL
ALT: 11 U/L (ref 0–44)
AST: 17 U/L (ref 15–41)
Albumin: 3.7 g/dL (ref 3.5–5.0)
Alkaline Phosphatase: 74 U/L (ref 38–126)
Anion gap: 9 (ref 5–15)
BUN: 41 mg/dL — ABNORMAL HIGH (ref 8–23)
CO2: 25 mmol/L (ref 22–32)
Calcium: 8.7 mg/dL — ABNORMAL LOW (ref 8.9–10.3)
Chloride: 105 mmol/L (ref 98–111)
Creatinine, Ser: 1.24 mg/dL — ABNORMAL HIGH (ref 0.44–1.00)
GFR, Estimated: 45 mL/min — ABNORMAL LOW (ref >60)
Glucose, Bld: 125 mg/dL — ABNORMAL HIGH (ref 70–99)
Potassium: 4.1 mmol/L (ref 3.5–5.1)
Sodium: 139 mmol/L (ref 135–145)
Total Bilirubin: 0.7 mg/dL (ref 0.3–1.2)
Total Protein: 6.8 g/dL (ref 6.5–8.1)

## 2022-02-18 LAB — URINALYSIS, ROUTINE W REFLEX MICROSCOPIC
Bilirubin Urine: NEGATIVE
Glucose, UA: NEGATIVE mg/dL
Hgb urine dipstick: NEGATIVE
Ketones, ur: NEGATIVE mg/dL
Nitrite: NEGATIVE
Protein, ur: NEGATIVE mg/dL
Specific Gravity, Urine: 1.02 (ref 1.005–1.030)
pH: 5.5 (ref 5.0–8.0)

## 2022-02-18 LAB — CBG MONITORING, ED: Glucose-Capillary: 116 mg/dL — ABNORMAL HIGH (ref 70–99)

## 2022-02-18 NOTE — ED Triage Notes (Signed)
Memory loss since recent hospitalization in February, Yesterday not herself per daughter.  Call to PMD, sent to ED for stroke work up.  Pt forgetful, doesn't remember to take medications.  Pt otherwise neuro intact, moves all extremities, pupils equal reactive, alert, oriented x4 ?

## 2022-02-18 NOTE — ED Notes (Signed)
[employer] Pacemaker interrogated per ED MD orders ?

## 2022-02-18 NOTE — ED Provider Notes (Signed)
? ?Emergency Department Provider Note ? ? ?I have reviewed the triage vital signs and the nursing notes. ? ? ?HISTORY ? ?Chief Complaint ?Memory Loss ? ? ?HPI ?Kelly Dickson is a 76 y.o. female with past medical history reviewed below including congestive heart failure, A-fib on digoxin, mitral regurgitation, pulmonary hypertension presents with confusion yesterday.  Daughter, at bedside, states that she has been having a lot of difficulty finding correct words for things and her speech seemed a little bit "off". She experienced mild confusion. Symptoms lasted for around 2 hours and have since gotten better. Patient recalls feeling "a bit off yesterday." Denies feeling any numbness/weakness. No HA. Daughter noted that patient had not taken any of her medication over the last 4 days, which is unusual. No dementia diagnosis.  ? ? ?Past Medical History:  ?Diagnosis Date  ? A-fib ([place])   ? Arthritis   ? CHF (congestive heart failure) ([place])   ? Essential hypertension   ? HLD (hyperlipidemia)   ? Kidney failure   ? D/T bACTRIM   ? MR (mitral regurgitation)   ? OSA (obstructive sleep apnea)   ? WITH NASAL PRONG   ? Panic attack   ? Pulmonary hypertension ([place])   ? TR (tricuspid regurgitation)   ? ? ?Review of Systems ? ?Constitutional: No fever/chills ?Eyes: No visual changes. ?ENT: No sore throat. ?Cardiovascular: Denies chest pain. ?Respiratory: Denies shortness of breath. ?Gastrointestinal: No abdominal pain.  No nausea, no vomiting.  No diarrhea.  No constipation. ?Genitourinary: Negative for dysuria. ?Musculoskeletal: Negative for back pain. ?Skin: Negative for rash. ?Neurological: Negative for headaches, focal weakness or numbness. Positive transient speech abnormality.  ? ? ?____________________________________________ ? ? ?PHYSICAL EXAM: ? ?VITAL SIGNS: ?ED Triage Vitals  ?Enc Vitals Group  ?   BP 02/18/22 1240 (!) 142/47  ?   Pulse Rate 02/18/22 1240 67  ?   Resp 02/18/22 1240 20  ?   Temp 02/18/22 1240 98 ?F  (36.7 ?C)  ?   Temp Source 02/18/22 1240 Oral  ?   SpO2 02/18/22 1240 99 %  ?   Weight 02/18/22 1307 183 lb 10.3 oz (83.3 kg)  ? ?Constitutional: Alert and oriented. Well appearing and in no acute distress. ?Eyes: Conjunctivae are normal. PERRL. EOMI. ?Head: Atraumatic. ?Nose: No congestion/rhinnorhea. ?Mouth/Throat: Mucous membranes are moist.  ?Neck: No stridor.  ?Cardiovascular: Normal rate, regular rhythm. Good peripheral circulation. Grossly normal heart sounds.   ?Respiratory: Normal respiratory effort.  No retractions. Lungs CTAB. ?Gastrointestinal: Soft and nontender. No distention.  ?Musculoskeletal: No lower extremity tenderness nor edema. No gross deformities of extremities. ?Neurologic:  Normal speech and language. No gross focal neurologic deficits are appreciated. Normal finger to nose testing. 5/5 strength in the bilateral upper/lower extremities.  ?Skin:  Skin is warm, dry and intact. No rash noted. ? ?____________________________________________ ?  ?LABS ?(all labs ordered are listed, but only abnormal results are displayed) ? ?Labs Reviewed  ?CBC WITH DIFFERENTIAL/PLATELET - Abnormal; Notable for the following components:  ?    Result Value  ? Hemoglobin 11.9 (*)   ? All other components within normal limits  ?COMPREHENSIVE METABOLIC PANEL - Abnormal; Notable for the following components:  ? Glucose, Bld 125 (*)   ? BUN 41 (*)   ? Creatinine, Ser 1.24 (*)   ? Calcium 8.7 (*)   ? GFR, Estimated 45 (*)   ? All other components within normal limits  ?URINALYSIS, ROUTINE W REFLEX MICROSCOPIC - Abnormal; Notable for the following components:  ?  APPearance HAZY (*)   ? Leukocytes,Ua TRACE (*)   ? All other components within normal limits  ?URINALYSIS, MICROSCOPIC (REFLEX) - Abnormal; Notable for the following components:  ? Bacteria, UA MANY (*)   ? All other components within normal limits  ?CBG MONITORING, ED - Abnormal; Notable for the following components:  ? Glucose-Capillary 116 (*)   ? All other  components within normal limits  ? ?____________________________________________ ? ?EKG ? ? EKG Interpretation ? ?Date/Time:  Wednesday February 18 2022 12:51:08 EDT ?Ventricular Rate:  57 ?PR Interval:    ?QRS Duration: 149 ?QT Interval:  471 ?QTC Calculation: 459 ?R Axis:   246 ?Text Interpretation: VENTRICULAR PACED RHYTHM Nonspecific IVCD with LAD Probable anteroseptal infarct, recent Lateral leads are also involved Confirmed by Nanda Quinton [phone]) on 02/18/2022 1:00:38 PM ?  ? ?  ? ? ?____________________________________________ ? ?RADIOLOGY ? ?CT HEAD WO CONTRAST (5MM) ? ?Result Date: 02/18/2022 ?CLINICAL DATA:  Mental status change, unknown cause EXAM: CT HEAD WITHOUT CONTRAST TECHNIQUE: Contiguous axial images were obtained from the base of the skull through the vertex without intravenous contrast. RADIATION DOSE REDUCTION: This exam was performed according to the departmental dose-optimization program which includes automated exposure control, adjustment of the mA and/or kV according to patient size and/or use of iterative reconstruction technique. COMPARISON:  CT head April 12, 2018.  MRI head April 13, 2018. FINDINGS: Brain: No evidence of acute infarction, hemorrhage, hydrocephalus, extra-axial collection or mass lesion/mass effect. Remote left basal ganglia and thalamic infarcts. Remote left frontoparietal cortical infarct. Vascular: No hyperdense vessel identified. Skull: No acute fracture. Sinuses/Orbits: Clear sinuses.  No acute orbital findings. Other: No mastoid effusions. IMPRESSION: 1. No evidence of acute intracranial abnormality. 2. Remote left frontoparietal, basal ganglia, and thalamic infarcts. Electronically Signed   By: Margaretha Sheffield M.D.   On: 02/18/2022 14:41   ? ?____________________________________________ ? ? ?PROCEDURES ? ?Procedure(s) performed:  ? ?Procedures ? ?None  ?____________________________________________ ? ? ?INITIAL IMPRESSION / ASSESSMENT AND PLAN / ED COURSE ? ?Pertinent  labs & imaging results that were available during my care of the patient were reviewed by me and considered in my medical decision making (see chart for details). ?  ?This patient is Presenting for Evaluation of AMS, which does require a range of treatment options, and is a complaint that involves a high risk of morbidity and mortality. ? ?The Differential Diagnoses includes but is not exclusive to alcohol, illicit or prescription medications, intracranial pathology such as stroke, intracerebral hemorrhage, fever or infectious causes including sepsis, hypoxemia, uremia, trauma, endocrine related disorders such as diabetes, hypoglycemia, thyroid-related diseases, etc. ? ? ? ?I did obtain Additional Historical Information from daughter at bedside. ? ?I decided to review pertinent External Data, and in summary patient's last admit . ?  ?Clinical Laboratory Tests Ordered, included CBC without leukocytosis. Creatinine 1.24 with BUN 41. No severe anemia. Glucose 116.  ? ?Radiologic Tests Ordered, included CT head. I independently interpreted the images and agree with radiology interpretation.  ? ?Cardiac Monitor Tracing which shows NSR. ? ? ?Social Determinants of Health Risk no smoking history.  ? ?Consult complete with Hospitalist. Spoke Dr. Ranelle Oyster with the hospitalist service and [facility]. He accepts the patient for admit.  ? ?Medical Decision Making: Summary:  ?Patient presents to the emergency department for evaluation of TIA like symptoms yesterday. Patient is neuro intact currently. Blood work is reassuring. UA pending along with head CT. Patient has an MRI-compatible pacemaker ([employer]). ABCD2 score of 4 noted (  moderate risk).  ? ?Reevaluation with update and discussion with patient and daughter. Plan for transfer to [facility] regional.  ? ?Disposition: transfer ? ?____________________________________________ ? ?FINAL CLINICAL IMPRESSION(S) / ED DIAGNOSES ? ?Final diagnoses:  ?TIA (transient ischemic  attack)  ? ? ? ?Note:  This document was prepared using Dragon voice recognition software and may include unintentional dictation errors. ? ?Nanda Quinton, MD, FACEP ?Emergency Medicine ? ?  ?Regis Wiland, Josh

## 2024-02-22 ENCOUNTER — Emergency Department (HOSPITAL_BASED_OUTPATIENT_CLINIC_OR_DEPARTMENT_OTHER)

## 2024-02-22 ENCOUNTER — Encounter (HOSPITAL_BASED_OUTPATIENT_CLINIC_OR_DEPARTMENT_OTHER): Payer: Self-pay | Admitting: Emergency Medicine

## 2024-02-22 ENCOUNTER — Inpatient Hospital Stay (HOSPITAL_BASED_OUTPATIENT_CLINIC_OR_DEPARTMENT_OTHER)
Admission: EM | Admit: 2024-02-22 | Discharge: 2024-02-25 | DRG: 291 | Disposition: A | Discharge status: Home / Self Care | Attending: Internal Medicine | Admitting: Internal Medicine

## 2024-02-22 ENCOUNTER — Other Ambulatory Visit: Payer: Self-pay

## 2024-02-22 DIAGNOSIS — Z888 Allergy status to other drugs, medicaments and biological substances status: Secondary | ICD-10-CM

## 2024-02-22 DIAGNOSIS — Z9109 Other allergy status, other than to drugs and biological substances: Secondary | ICD-10-CM

## 2024-02-22 DIAGNOSIS — Z882 Allergy status to sulfonamides status: Secondary | ICD-10-CM

## 2024-02-22 DIAGNOSIS — G629 Polyneuropathy, unspecified: Secondary | ICD-10-CM | POA: Diagnosis present

## 2024-02-22 DIAGNOSIS — Z96653 Presence of artificial knee joint, bilateral: Secondary | ICD-10-CM | POA: Diagnosis present

## 2024-02-22 DIAGNOSIS — G4733 Obstructive sleep apnea (adult) (pediatric): Secondary | ICD-10-CM | POA: Diagnosis present

## 2024-02-22 DIAGNOSIS — Z7901 Long term (current) use of anticoagulants: Secondary | ICD-10-CM

## 2024-02-22 DIAGNOSIS — Z7982 Long term (current) use of aspirin: Secondary | ICD-10-CM

## 2024-02-22 DIAGNOSIS — F410 Panic disorder [episodic paroxysmal anxiety] without agoraphobia: Secondary | ICD-10-CM | POA: Diagnosis present

## 2024-02-22 DIAGNOSIS — E785 Hyperlipidemia, unspecified: Secondary | ICD-10-CM | POA: Diagnosis present

## 2024-02-22 DIAGNOSIS — I5021 Acute systolic (congestive) heart failure: Secondary | ICD-10-CM | POA: Diagnosis present

## 2024-02-22 DIAGNOSIS — I110 Hypertensive heart disease with heart failure: Principal | ICD-10-CM | POA: Diagnosis present

## 2024-02-22 DIAGNOSIS — I081 Rheumatic disorders of both mitral and tricuspid valves: Secondary | ICD-10-CM | POA: Diagnosis present

## 2024-02-22 DIAGNOSIS — I482 Chronic atrial fibrillation, unspecified: Secondary | ICD-10-CM | POA: Diagnosis present

## 2024-02-22 DIAGNOSIS — Z79899 Other long term (current) drug therapy: Secondary | ICD-10-CM

## 2024-02-22 DIAGNOSIS — I5023 Acute on chronic systolic (congestive) heart failure: Secondary | ICD-10-CM | POA: Diagnosis present

## 2024-02-22 DIAGNOSIS — Z1152 Encounter for screening for COVID-19: Secondary | ICD-10-CM

## 2024-02-22 DIAGNOSIS — Z8249 Family history of ischemic heart disease and other diseases of the circulatory system: Secondary | ICD-10-CM

## 2024-02-22 DIAGNOSIS — M1990 Unspecified osteoarthritis, unspecified site: Secondary | ICD-10-CM | POA: Diagnosis present

## 2024-02-22 DIAGNOSIS — R0602 Shortness of breath: Secondary | ICD-10-CM | POA: Diagnosis not present

## 2024-02-22 DIAGNOSIS — I509 Heart failure, unspecified: Secondary | ICD-10-CM

## 2024-02-22 DIAGNOSIS — J9601 Acute respiratory failure with hypoxia: Principal | ICD-10-CM | POA: Diagnosis present

## 2024-02-22 DIAGNOSIS — I272 Pulmonary hypertension, unspecified: Secondary | ICD-10-CM | POA: Diagnosis present

## 2024-02-22 LAB — COMPREHENSIVE METABOLIC PANEL WITH GFR
ALT: 13 U/L (ref 0–44)
AST: 21 U/L (ref 15–41)
Albumin: 4.4 g/dL (ref 3.5–5.0)
Alkaline Phosphatase: 91 U/L (ref 38–126)
Anion gap: 16 — ABNORMAL HIGH (ref 5–15)
BUN: 25 mg/dL — ABNORMAL HIGH (ref 8–23)
CO2: 21 mmol/L — ABNORMAL LOW (ref 22–32)
Calcium: 9.6 mg/dL (ref 8.9–10.3)
Chloride: 100 mmol/L (ref 98–111)
Creatinine, Ser: 1.01 mg/dL — ABNORMAL HIGH (ref 0.44–1.00)
GFR, Estimated: 57 mL/min — ABNORMAL LOW (ref >60)
Glucose, Bld: 144 mg/dL — ABNORMAL HIGH (ref 70–99)
Potassium: 4.3 mmol/L (ref 3.5–5.1)
Sodium: 138 mmol/L (ref 135–145)
Total Bilirubin: 0.6 mg/dL (ref 0.0–1.2)
Total Protein: 7.5 g/dL (ref 6.5–8.1)

## 2024-02-22 LAB — RESP PANEL BY RT-PCR (RSV, FLU A&B, COVID)  RVPGX2
Influenza A by PCR: NEGATIVE
Influenza B by PCR: NEGATIVE
Resp Syncytial Virus by PCR: NEGATIVE
SARS Coronavirus 2 by RT PCR: NEGATIVE

## 2024-02-22 LAB — CBC WITH DIFFERENTIAL/PLATELET
Abs Immature Granulocytes: 0.03 10*3/uL (ref 0.00–0.07)
Basophils Absolute: 0 10*3/uL (ref 0.0–0.1)
Basophils Relative: 0 %
Eosinophils Absolute: 0 10*3/uL (ref 0.0–0.5)
Eosinophils Relative: 0 %
HCT: 38.9 % (ref 36.0–46.0)
Hemoglobin: 12.5 g/dL (ref 12.0–15.0)
Immature Granulocytes: 0 %
Lymphocytes Relative: 9 %
Lymphs Abs: 0.7 10*3/uL (ref 0.7–4.0)
MCH: 29.1 pg (ref 26.0–34.0)
MCHC: 32.1 g/dL (ref 30.0–36.0)
MCV: 90.5 fL (ref 80.0–100.0)
Monocytes Absolute: 0.5 10*3/uL (ref 0.1–1.0)
Monocytes Relative: 7 %
Neutro Abs: 6.6 10*3/uL (ref 1.7–7.7)
Neutrophils Relative %: 84 %
Platelets: 163 10*3/uL (ref 150–400)
RBC: 4.3 MIL/uL (ref 3.87–5.11)
RDW: 14.7 % (ref 11.5–15.5)
WBC: 7.9 10*3/uL (ref 4.0–10.5)
nRBC: 0 % (ref 0.0–0.2)

## 2024-02-22 LAB — BASIC METABOLIC PANEL WITH GFR
Anion gap: 16 — ABNORMAL HIGH (ref 5–15)
BUN: 25 mg/dL — ABNORMAL HIGH (ref 8–23)
CO2: 22 mmol/L (ref 22–32)
Calcium: 9.3 mg/dL (ref 8.9–10.3)
Chloride: 99 mmol/L (ref 98–111)
Creatinine, Ser: 0.93 mg/dL (ref 0.44–1.00)
GFR, Estimated: >60 mL/min (ref >60)
Glucose, Bld: 120 mg/dL — ABNORMAL HIGH (ref 70–99)
Potassium: 3.8 mmol/L (ref 3.5–5.1)
Sodium: 137 mmol/L (ref 135–145)

## 2024-02-22 LAB — PRO BRAIN NATRIURETIC PEPTIDE: Pro Brain Natriuretic Peptide: 11240 pg/mL — ABNORMAL HIGH (ref <300.0)

## 2024-02-22 LAB — TROPONIN T, HIGH SENSITIVITY
Troponin T High Sensitivity: 64 ng/L — ABNORMAL HIGH (ref <19)
Troponin T High Sensitivity: 65 ng/L — ABNORMAL HIGH (ref <19)

## 2024-02-22 MED ORDER — FUROSEMIDE 10 MG/ML IJ SOLN
40.0000 mg | Freq: Once | INTRAMUSCULAR | Status: AC
  Administered 2024-02-22: 40 mg via INTRAVENOUS
  Filled 2024-02-22: qty 4

## 2024-02-22 MED ORDER — IPRATROPIUM-ALBUTEROL 0.5-2.5 (3) MG/3ML IN SOLN
3.0000 mL | Freq: Once | RESPIRATORY_TRACT | Status: AC
  Administered 2024-02-22: 3 mL via RESPIRATORY_TRACT
  Filled 2024-02-22: qty 3

## 2024-02-22 MED ORDER — NITROGLYCERIN 0.4 MG SL SUBL
0.4000 mg | SUBLINGUAL_TABLET | Freq: Once | SUBLINGUAL | Status: AC
  Administered 2024-02-22: 0.4 mg via SUBLINGUAL

## 2024-02-22 MED ORDER — NITROGLYCERIN 0.4 MG SL SUBL
0.4000 mg | SUBLINGUAL_TABLET | Freq: Once | SUBLINGUAL | Status: AC
  Administered 2024-02-22: 0.4 mg via SUBLINGUAL
  Filled 2024-02-22: qty 1

## 2024-02-22 NOTE — ED Notes (Signed)
   02/22/24 1647  Respiratory Assessment  $ RT Protocol Assessment  Yes  Assessment Type Pre-treatment  Respiratory Pattern Regular;Labored;Dyspnea at rest;Retractions;Symmetrical  Chest Assessment Chest expansion symmetrical  Cough Congested  Bilateral Breath Sounds Expiratory wheezes (audible mostly upper airway)  Oxygen Therapy/Pulse Ox  O2 Device Nasal Cannula  O2 Therapy Oxygen humidified  O2 Flow Rate (L/min) 4 L/min   Seen in triage expiratory wheeze upper airways, SpO2 90% on room air, once in room2 SPO2 87% with exertion.Placed on 4LPM [place]. Spo2  98% now.  IV started labs sent and duoneb given.

## 2024-02-22 NOTE — Progress Notes (Signed)
 Hospitalist Transfer Note:    Nursing staff, Please call [facility] Admits & Consults System-Wide number on Amion [phone]) as soon as patient's arrival, so appropriate admitting provider can evaluate the pt.   Transferring facility: [facility] Requesting provider: Dr. Ranelle Buys (EDP at [facility]) Reason for transfer: admission for further evaluation and management of acute on chronic systolic heart failure complicated by acute hypoxic respiratory distress.    78 year old female with history of chronic systolic heart failure, who presented to [facility] ED complaining of 2 days of progressive shortness of breath.  Denies any associated chest pain.  She has a history of chronic systolic heart failure with most recent echocardiogram performed in June 2019, which demonstrated LVEF 30 to 35%.  She confirms no known baseline supplemental oxygen requirements.  Vital signs in the ED were notable for the following: Initial oxygen saturation in the low to mid 80s on room air, [place] improving into the mid 90s on 2 L nasal cannula  Labs were notable for BNP of 11,000, initial high-sensitivity troponin I was found to be 64, with repeat troponin pending.  EKG is reported to show no evidence of acute ischemic changes.  Chest x-ray showed mild cardiomegaly, but otherwise no acute findings.  EDP performed bedside ultrasound, which indicated Kerly B-lines.  Medications administered prior to transfer included the following: IV lasix .    Subsequently, I accepted this patient for transfer for inpatient admission to a cardiac-tele bed at  [facility] for further work-up and management of the above.       Camelia Cavalier, DO Hospitalist

## 2024-02-22 NOTE — ED Notes (Signed)
 Pt was SOB upon assessment, warm and dry. Noted to have 1-2 word dyspnea but CAOx4 and conversational to the best of her ability.

## 2024-02-22 NOTE — ED Triage Notes (Signed)
 Pt with cough, SHOB for a few days, but worse today; increased WOB noted; RT in to assess

## 2024-02-22 NOTE — ED Provider Notes (Signed)
 [place] [facility] DE[facility] AT [facility] Provider Note   CSN: [id number] Arrival date & time: 02/22/24  1634     History  Chief Complaint  Patient presents with   Shortness of Breath    Kelly Dickson is a 78 y.o. female.  With a history of heart failure, mitral regurgitation and atrial fibrillation on Eliquis  who presents to the ED for shortness of breath.  Ongoing shortness of breath with audible wheezing beginning yesterday and persisting through today.  Shortness of breath with minimal exertion.  Reports compliance with Eliquis  and Lasix  as well as her other medications.  No increased peripheral edema or appreciable weight gain.  Denies productive coughing fevers chills nausea vomiting abdominal pain.  No reported chest pain.   Shortness of Breath      Home Medications Prior to Admission medications   Medication Sig Start Date End Date Taking? Authorizing Provider  acetaminophen  (TYLENOL ) 650 MG CR tablet Take 1,300 mg by mouth every 8 (eight) hours as needed for pain.     [provider]  ALPRAZolam  (XANAX ) 0.25 MG tablet Take 0.25 mg by mouth daily as needed for anxiety or sleep. 03/20/21   [provider]  aspirin  81 MG EC tablet Take 1 tablet by mouth daily. 12/20/20   [provider]  benzonatate  (TESSALON ) 200 MG capsule Take 200 mg by mouth 3 (three) times daily as needed for cough. 09/24/21   [provider]  Bioflavonoid Products (VITAMIN C /BIOFLAVONOIDS) 1000-25 MG TABS Take 1 tablet by mouth every other day.     [provider]  Calcium  Carbonate-Vitamin D  (CALCIUM -VITAMIN D ) 500-200 MG-UNIT tablet Take 1 tablet by mouth every other day.     [provider]  Coenzyme Q10 (COQ10) 100 MG CAPS Take 100 mg by mouth every other day.     [provider]  digoxin  (LANOXIN ) 0.125 MG tablet Take 125 mcg by mouth daily. 12/18/21   [provider]  DULoxetine  (CYMBALTA ) 30 MG capsule Take 60  mg by mouth daily.     [provider]  DULoxetine  (CYMBALTA ) 60 MG capsule Take 60 mg by mouth daily. 01/26/22   [provider]  ELIQUIS  5 MG TABS tablet TAKE 1 TABLET BY MOUTH TWICE DAILY 03/30/19   Bensimhon, Rheta Celestine, MD  ENTRESTO  24-26 MG Take 1 tablet by mouth 2 (two) times daily. 02/02/22   [provider]  ENTRESTO  97-103 MG TAKE ONE TABLET BY MOUTH TWICE DAILY Patient not taking: Reported on 02/18/2022 10/14/18   Bensimhon, Rheta Celestine, MD  ferrous sulfate  325 (65 FE) MG tablet Take 1 tablet by mouth daily.    [provider]  furosemide  (LASIX ) 20 MG tablet Take 20 mg by mouth in the morning and at bedtime. 11/15/21   [provider]  furosemide  (LASIX ) 40 MG tablet Take 40 mg tab, twice a day until weight is at 232 lbs, then decrease to 40 mg daily. If wt is or above 235 take extra 40 mg tab. Patient not taking: Reported on 02/18/2022 04/29/18   Bensimhon, Rheta Celestine, MD  gabapentin  (NEURONTIN ) 100 MG capsule Take 100 mg by mouth daily. 02/13/22   [provider]  gabapentin  (NEURONTIN ) 300 MG capsule Take 1 capsule (300 mg total) by mouth 2 (two) times daily. Patient taking differently: Take 300 mg by mouth 3 (three) times daily. 04/14/18   Armenta Landau, MD  lactobacillus acidophilus & bulgar (LACTINEX) chewable tablet Chew 3 tablets by mouth every  other day.     [provider]  LECITHIN PO Take 2 tablets by mouth every other day.     [provider]  Liniments [facility] ARTHRITIS PAIN RELIEF EX) Place 1 patch onto the skin daily as needed (pain).    [provider]  loratadine  (CLARITIN ) 10 MG tablet Take 10 mg by mouth daily as needed for allergies.     [provider]  Menthol , Topical Analgesic, (BIOFREEZE EX) Apply 1 application topically 2 (two) times daily as needed (pain).    [provider]  methocarbamol  (ROBAXIN ) 500 MG tablet Take 1 tablet (500 mg total) by mouth every 6 (six) hours  as needed for muscle spasms. Patient not taking: Reported on 02/18/2022 07/08/18   Edmisten, Onesimo Bijou, PA  metoprolol  succinate (TOPROL -XL) 25 MG 24 hr tablet Take 25 mg by mouth daily. 01/26/22   [provider]  Multiple Vitamins-Minerals (MULTIVITAMIN WITH MINERALS) tablet Take 1 tablet by mouth every other day.     [provider]  Omega-3 Fatty Acids (FISH OIL ) 1000 MG CAPS Take 1,000 mg by mouth every other day.     [provider]  oxybutynin  (DITROPAN ) 5 MG tablet Take 5 mg by mouth 2 (two) times daily. 11/24/21   [provider]  oxyCODONE  (OXY IR/ROXICODONE ) 5 MG immediate release tablet Take 1-2 tablets (5-10 mg total) by mouth every 6 (six) hours as needed for moderate pain (pain score 4-6). Patient not taking: Reported on 02/18/2022 07/08/18   Edmisten, Kristie L, PA  Red Yeast Rice 600 MG CAPS Take 600 mg by mouth every other day.     [provider]  senna (SENOKOT) 8.6 MG TABS tablet Take 1 tablet (8.6 mg total) by mouth 2 (two) times daily. Patient not taking: Reported on 02/18/2022 04/14/18   Armenta Landau, MD  spironolactone  (ALDACTONE ) 25 MG tablet Take 25 mg by mouth daily. 01/12/22   [provider]  traMADol  (ULTRAM ) 50 MG tablet Take 50 mg by mouth daily as needed. 01/05/22   [provider]  vitamin E 400 UNIT capsule Take 400 Units by mouth every other day.    [provider]      Allergies    Statins and Sulfamethoxazole-trimethoprim    Review of Systems   Review of Systems  Respiratory:  Positive for shortness of breath.     Physical Exam Updated Vital Signs BP (!) 177/84   Pulse 95   Temp 99.8 F (37.7 C) (Oral)   Resp (!) 28   Ht 5\' 2"  (1.575 m)   Wt 77.1 kg   SpO2 97%   BMI 31.09 kg/m  Physical Exam Vitals and nursing note reviewed.  HENT:     Head: Normocephalic and atraumatic.  Eyes:     Pupils: Pupils are equal, round, and reactive to light.  Cardiovascular:     Rate and  Rhythm: Normal rate and regular rhythm.  Pulmonary:     Effort: Pulmonary effort is normal.     Breath sounds: Examination of the right-upper field reveals wheezing. Examination of the left-upper field reveals wheezing. Wheezing present.  Abdominal:     Palpations: Abdomen is soft.     Tenderness: There is no abdominal tenderness.  Skin:    General: Skin is warm and dry.  Neurological:     Mental Status: She is alert.  Psychiatric:        Mood and Affect: Mood normal.     ED Results /  Procedures / Treatments   Labs (all labs ordered are listed, but only abnormal results are displayed) Labs Reviewed  COMPREHENSIVE METABOLIC PANEL WITH GFR - Abnormal; Notable for the following components:      Result Value   CO2 21 (*)    Glucose, Bld 144 (*)    BUN 25 (*)    Creatinine, Ser 1.01 (*)    GFR, Estimated 57 (*)    Anion gap 16 (*)    All other components within normal limits  PRO BRAIN NATRIURETIC PEPTIDE - Abnormal; Notable for the following components:   Pro Brain Natriuretic Peptide 11,240.0 (*)    All other components within normal limits  TROPONIN T, HIGH SENSITIVITY - Abnormal; Notable for the following components:   Troponin T High Sensitivity 64 (*)    All other components within normal limits  TROPONIN T, HIGH SENSITIVITY - Abnormal; Notable for the following components:   Troponin T High Sensitivity 65 (*)    All other components within normal limits  RESP PANEL BY RT-PCR (RSV, FLU A&B, COVID)  RVPGX2  CBC WITH DIFFERENTIAL/PLATELET    EKG EKG Interpretation Date/Time:  Tuesday February 22 2024 16:44:39 EDT Ventricular Rate:  103 PR Interval:  167 QRS Duration:  135 QT Interval:  410 QTC Calculation: 537 R Axis:   0  Text Interpretation: VENTRICULAR PACED RHYTHM LVH with IVCD and secondary repol abnrm Probable lateral infarct, age indeterminate Prolonged QT interval Confirmed by Rafael Bun [phone]) on 02/22/2024 4:49:19 PM  Radiology DG Chest Portable 1  View Result Date: 02/22/2024 CLINICAL DATA:  Shortness of breath and coughing for several days. EXAM: PORTABLE CHEST 1 VIEW COMPARISON:  August 03, 2018. FINDINGS: Mild cardiomegaly. Left-sided pacemaker is in grossly good position. Both lungs are clear. The visualized skeletal structures are unremarkable. IMPRESSION: No active disease. Electronically Signed   By: Rosalene Colon M.D.   On: 02/22/2024 17:51    Procedures Ultrasound ED Thoracic  Date/Time: 02/22/2024 5:26 PM  Performed by: Sallyanne Creamer, DO Authorized by: Sallyanne Creamer, DO   Procedure details:    Indications: dyspnea     Assessment for:  Pleural effusion, pneumothorax and hemothorax   Left lung pleural:  Visualized   Right lung pleural:  Visualized   Images: archived   Findings:    A-lines noted throughout: not identified     B-lines noted throughout: identified   Right Lung Findings:     right lung sliding    no right lung consolidation    no right lung pleural effusion  Left Lung Findings:     left lung sliding    no left lung consolidation    no left lung pleural effusion Impression:    Impression: interstitial syndrome   Comments:     Scant lines in all 4 lung fields .Critical Care  Performed by: Sallyanne Creamer, DO Authorized by: Sallyanne Creamer, DO   Critical care provider statement:    Critical care time (minutes):  35   Critical care time was exclusive of:  Teaching time and separately billable procedures and treating other patients   Critical care was necessary to treat or prevent imminent or life-threatening deterioration of the following conditions:  Respiratory failure   Critical care was time spent personally by me on the following activities:  Development of treatment plan with patient or surrogate, evaluation of patient's response to treatment, examination of patient, ordering and review of laboratory studies, ordering and review of radiographic studies, ordering  and performing treatments  and interventions, pulse oximetry, re-evaluation of patient's condition and review of old charts   I assumed direction of critical care for this patient from another provider in my specialty: no     Care discussed with: admitting provider       Medications Ordered in ED Medications  ipratropium-albuterol  (DUONEB) 0.5-2.5 (3) MG/3ML nebulizer solution 3 mL (3 mLs Nebulization Given 02/22/24 1714)  furosemide  (LASIX ) injection 40 mg (40 mg Intravenous Given 02/22/24 1724)  nitroGLYCERIN  (NITROSTAT ) SL tablet 0.4 mg (0.4 mg Sublingual Given 02/22/24 1724)  nitroGLYCERIN  (NITROSTAT ) SL tablet 0.4 mg (0.4 mg Sublingual Given 02/22/24 1902)    ED Course/ Medical Decision Making/ A&P Clinical Course as of 02/22/24 2000  Tue Feb 22, 2024  1958 Workup most consistent with heart failure exacerbation with BNP over 11,000.  Initial troponin 64 with delta of 65.  Suspicion for ACS at this time.  Patient reports significant improvement after nitro Lasix  and breathing treatment.  Most likely heart failure exacerbation.  She is still on supplemental oxygen.  Will admit to medicine for acute heart failure exacerbation [MP]    Clinical Course User Index [MP] Sallyanne Creamer, DO                                 Medical Decision Making 78 year old female with history as above presenting for 2 days of worsening shortness of breath.  Audible wheezing on assessment.  Tachypnea.  Hypertension.  Bedside ultrasound notable for diffuse B-lines concerning for pulmonary edema.  Differential diagnosis includes heart failure exacerbation, pneumonia, viral respiratory illness and dysrhythmia.  Low sufficient for ACS given lack of reported chest pain.  Suspicion for pulmonary embolism as patient has been compliant with Eliquis  and has not missed any doses.  Will obtain laboratory workup including BNP, chest x-ray and EKG  Will trial DuoNeb treatment for wheezing although she has no history of asthma or COPD this may help if  there is some component of viral bronchitis. Given finding of B-lines on ultrasound and history of heart failure we will give her 1 dose of sublingual nitro as she is very hypertensive here with systolic in the 190s along with IV Lasix . Will leave her on 2 L nasal cannula as she was in the mid to high 80s on room air when getting into her ED room.  No home O2 at baseline  Admission likely  Amount and/or Complexity of Data Reviewed Labs: ordered. Radiology: ordered.  Risk Prescription drug management. Decision regarding hospitalization.           Final Clinical Impression(s) / ED Diagnoses Final diagnoses:  Acute hypoxemic respiratory failure (HCC)  Acute on chronic congestive heart failure, unspecified heart failure type [facility])    Rx / DC Orders ED Discharge Orders     None         Sallyanne Creamer, DO 02/22/24 2000

## 2024-02-23 DIAGNOSIS — Z9109 Other allergy status, other than to drugs and biological substances: Secondary | ICD-10-CM | POA: Diagnosis not present

## 2024-02-23 DIAGNOSIS — G629 Polyneuropathy, unspecified: Secondary | ICD-10-CM | POA: Diagnosis present

## 2024-02-23 DIAGNOSIS — I272 Pulmonary hypertension, unspecified: Secondary | ICD-10-CM | POA: Diagnosis present

## 2024-02-23 DIAGNOSIS — I5023 Acute on chronic systolic (congestive) heart failure: Secondary | ICD-10-CM | POA: Diagnosis present

## 2024-02-23 DIAGNOSIS — I081 Rheumatic disorders of both mitral and tricuspid valves: Secondary | ICD-10-CM | POA: Diagnosis present

## 2024-02-23 DIAGNOSIS — I110 Hypertensive heart disease with heart failure: Secondary | ICD-10-CM | POA: Diagnosis present

## 2024-02-23 DIAGNOSIS — Z7901 Long term (current) use of anticoagulants: Secondary | ICD-10-CM | POA: Diagnosis not present

## 2024-02-23 DIAGNOSIS — Z1152 Encounter for screening for COVID-19: Secondary | ICD-10-CM | POA: Diagnosis not present

## 2024-02-23 DIAGNOSIS — G4733 Obstructive sleep apnea (adult) (pediatric): Secondary | ICD-10-CM | POA: Diagnosis present

## 2024-02-23 DIAGNOSIS — I482 Chronic atrial fibrillation, unspecified: Secondary | ICD-10-CM | POA: Diagnosis present

## 2024-02-23 DIAGNOSIS — Z79899 Other long term (current) drug therapy: Secondary | ICD-10-CM | POA: Diagnosis not present

## 2024-02-23 DIAGNOSIS — Z7982 Long term (current) use of aspirin: Secondary | ICD-10-CM | POA: Diagnosis not present

## 2024-02-23 DIAGNOSIS — Z8249 Family history of ischemic heart disease and other diseases of the circulatory system: Secondary | ICD-10-CM | POA: Diagnosis not present

## 2024-02-23 DIAGNOSIS — J9601 Acute respiratory failure with hypoxia: Secondary | ICD-10-CM | POA: Diagnosis present

## 2024-02-23 DIAGNOSIS — Z882 Allergy status to sulfonamides status: Secondary | ICD-10-CM | POA: Diagnosis not present

## 2024-02-23 DIAGNOSIS — Z888 Allergy status to other drugs, medicaments and biological substances status: Secondary | ICD-10-CM | POA: Diagnosis not present

## 2024-02-23 DIAGNOSIS — I5021 Acute systolic (congestive) heart failure: Secondary | ICD-10-CM | POA: Diagnosis present

## 2024-02-23 DIAGNOSIS — Z96653 Presence of artificial knee joint, bilateral: Secondary | ICD-10-CM | POA: Diagnosis present

## 2024-02-23 DIAGNOSIS — M1990 Unspecified osteoarthritis, unspecified site: Secondary | ICD-10-CM | POA: Diagnosis present

## 2024-02-23 DIAGNOSIS — R0602 Shortness of breath: Secondary | ICD-10-CM | POA: Diagnosis present

## 2024-02-23 DIAGNOSIS — F410 Panic disorder [episodic paroxysmal anxiety] without agoraphobia: Secondary | ICD-10-CM | POA: Diagnosis present

## 2024-02-23 DIAGNOSIS — E785 Hyperlipidemia, unspecified: Secondary | ICD-10-CM | POA: Diagnosis present

## 2024-02-23 LAB — RESPIRATORY PANEL BY PCR

## 2024-02-23 MED ORDER — METOPROLOL SUCCINATE ER 25 MG PO TB24
12.5000 mg | ORAL_TABLET | Freq: Every day | ORAL | Status: DC
  Administered 2024-02-23 – 2024-02-25 (×3): 12.5 mg via ORAL
  Filled 2024-02-23 (×2): qty 1

## 2024-02-23 MED ORDER — FLUTICASONE PROPIONATE 50 MCG/ACT NA SUSP
1.0000 | NASAL | Status: DC | PRN
Start: 2024-02-23 — End: 2024-02-23

## 2024-02-23 MED ORDER — FUROSEMIDE 20 MG PO TABS
20.0000 mg | ORAL_TABLET | Freq: Every day | ORAL | Status: DC
  Administered 2024-02-23: 20 mg via ORAL

## 2024-02-23 MED ORDER — ALPRAZOLAM 0.25 MG PO TABS
0.2500 mg | ORAL_TABLET | Freq: Every evening | ORAL | Status: DC | PRN
Start: 2024-02-23 — End: 2024-02-25

## 2024-02-23 MED ORDER — DULOXETINE HCL 60 MG PO CPEP
60.0000 mg | ORAL_CAPSULE | Freq: Every day | ORAL | Status: DC
  Administered 2024-02-24 – 2024-02-25 (×2): 60 mg via ORAL
  Filled 2024-02-23 (×2): qty 1

## 2024-02-23 MED ORDER — SACUBITRIL-VALSARTAN 24-26 MG PO TABS
1.0000 | ORAL_TABLET | Freq: Two times a day (BID) | ORAL | Status: DC
  Administered 2024-02-23 – 2024-02-25 (×4): 1 via ORAL
  Filled 2024-02-23 (×4): qty 1

## 2024-02-23 MED ORDER — OXYBUTYNIN CHLORIDE 5 MG PO TABS
5.0000 mg | ORAL_TABLET | Freq: Two times a day (BID) | ORAL | Status: DC
  Administered 2024-02-23 – 2024-02-25 (×5): 5 mg via ORAL
  Filled 2024-02-23 (×5): qty 1

## 2024-02-23 MED ORDER — VITAMIN C 500 MG PO TABS
1000.0000 mg | ORAL_TABLET | Freq: Every day | ORAL | Status: DC
Start: 2024-02-23 — End: 2024-02-25
  Administered 2024-02-23 – 2024-02-25 (×3): 1000 mg via ORAL
  Filled 2024-02-23 (×3): qty 2

## 2024-02-23 MED ORDER — FUROSEMIDE 20 MG PO TABS: 20.0000 mg | ORAL_TABLET | ORAL | Status: DC

## 2024-02-23 MED ORDER — FLUTICASONE PROPIONATE 50 MCG/ACT NA SUSP: 1.0000 | Freq: Every day | NASAL | Status: DC | PRN

## 2024-02-23 MED ORDER — GABAPENTIN 100 MG PO CAPS: 100.0000 mg | ORAL_CAPSULE | Freq: Every day | ORAL | Status: DC

## 2024-02-23 MED ORDER — FUROSEMIDE 10 MG/ML IJ SOLN
20.0000 mg | Freq: Two times a day (BID) | INTRAMUSCULAR | Status: DC
  Administered 2024-02-23 – 2024-02-24 (×3): 20 mg via INTRAVENOUS
  Filled 2024-02-23 (×3): qty 2

## 2024-02-23 MED ORDER — ACETAMINOPHEN 325 MG PO TABS
650.0000 mg | ORAL_TABLET | Freq: Four times a day (QID) | ORAL | Status: DC | PRN
Start: 2024-02-23 — End: 2024-02-25

## 2024-02-23 MED ORDER — TRAMADOL HCL 50 MG PO TABS: 50.0000 mg | ORAL_TABLET | Freq: Three times a day (TID) | ORAL | Status: DC | PRN

## 2024-02-23 MED ORDER — GABAPENTIN 300 MG PO CAPS
300.0000 mg | ORAL_CAPSULE | Freq: Every day | ORAL | Status: DC
Start: 2024-02-23 — End: 2024-02-25
  Administered 2024-02-23 – 2024-02-24 (×2): 300 mg via ORAL
  Filled 2024-02-23 (×2): qty 1

## 2024-02-23 MED ORDER — SPIRONOLACTONE 25 MG PO TABS
25.0000 mg | ORAL_TABLET | Freq: Every day | ORAL | Status: DC
  Administered 2024-02-23 – 2024-02-25 (×3): 25 mg via ORAL
  Filled 2024-02-23 (×3): qty 1

## 2024-02-23 MED ORDER — APIXABAN 5 MG PO TABS
5.0000 mg | ORAL_TABLET | Freq: Two times a day (BID) | ORAL | Status: DC
  Administered 2024-02-23 – 2024-02-25 (×5): 5 mg via ORAL
  Filled 2024-02-23 (×2): qty 1
  Filled 2024-02-23: qty 2
  Filled 2024-02-23 (×2): qty 1

## 2024-02-23 MED ORDER — TRAMADOL HCL 50 MG PO TABS: 50.0000 mg | ORAL_TABLET | ORAL | Status: DC | PRN

## 2024-02-23 MED ORDER — IPRATROPIUM-ALBUTEROL 0.5-2.5 (3) MG/3ML IN SOLN: 3.0000 mL | Freq: Four times a day (QID) | RESPIRATORY_TRACT | Status: DC | PRN

## 2024-02-23 MED ORDER — BUFFERED VITAMIN C 1000 MG PO CAPS: 1000.0000 mg | ORAL_CAPSULE | Freq: Every day | ORAL | Status: DC

## 2024-02-23 MED ORDER — GABAPENTIN 100 MG PO CAPS
100.0000 mg | ORAL_CAPSULE | Freq: Every morning | ORAL | Status: DC
  Administered 2024-02-23 – 2024-02-25 (×3): 100 mg via ORAL
  Filled 2024-02-23 (×2): qty 1

## 2024-02-23 MED ORDER — ACETAMINOPHEN ER 650 MG PO TBCR: 650.0000 mg | EXTENDED_RELEASE_TABLET | ORAL | Status: DC | PRN

## 2024-02-23 MED ORDER — FERROUS SULFATE 325 (65 FE) MG PO TABS
975.0000 mg | ORAL_TABLET | Freq: Every day | ORAL | Status: DC
  Administered 2024-02-23 – 2024-02-25 (×3): 975 mg via ORAL
  Filled 2024-02-23 (×3): qty 3

## 2024-02-23 MED ORDER — DIGOXIN 125 MCG PO TABS
62.5000 ug | ORAL_TABLET | Freq: Every day | ORAL | Status: DC
  Administered 2024-02-23 – 2024-02-25 (×3): 62.5 ug via ORAL
  Filled 2024-02-23 (×2): qty 1

## 2024-02-23 NOTE — ED Notes (Signed)
 Pt took home medications that daughter brought from home, including Digoxin , Entresto , Metoprolol , and Gabapentin . Dr. Martina Sledge approved this and ordered all home medications.   Kelly Dickson

## 2024-02-23 NOTE — H&P (Signed)
 History and Physical    Patient: Kelly Dickson DOB: [birth year] DOA: 02/22/2024 DOS: the patient was seen and examined on 02/23/2024 PCP: Scarlett Current, PA-C     Chief Complaint:  Chief Complaint  Patient presents with   Shortness of Breath   HPI: Kelly Dickson is a 78 y.o. female with medical history significant of systolic CHF (diagnosed 2016), afib on eliquis  who initially presented to [facility] ER for 2 days of sob and wheezing.  Pt's daughter states the pt has actually been "winded" for approx 1 month. She denies cough, fever, chills or chest pain.  Currently exercise tolerance is poor and she sleeps with 2 pillows at night for comfort. Pt initally required 4L [place] but this improved with lasix . Pt was transferred to [facility] for further management of her CHF.   Review of Systems: Review of Systems  Constitutional:  Negative for chills and fever.  HENT:  Negative for hearing loss and tinnitus.   Eyes:  Negative for blurred vision and double vision.  Respiratory:  Positive for shortness of breath and wheezing. Negative for cough.   Cardiovascular:  Negative for chest pain and leg swelling.  Gastrointestinal:  Negative for nausea and vomiting.  Genitourinary:  Negative for dysuria and urgency.  Musculoskeletal:  Negative for back pain.  Skin:  Negative for rash.  Neurological:  Negative for dizziness and headaches.  Endo/Heme/Allergies:  Negative for polydipsia.  Psychiatric/Behavioral:  Negative for depression.     Past Medical History:  Diagnosis Date   A-fib (HCC)    Arthritis    CHF (congestive heart failure) (HCC)    Essential hypertension    HLD (hyperlipidemia)    Kidney failure    D/T bACTRIM    MR (mitral regurgitation)    OSA (obstructive sleep apnea)    WITH NASAL PRONG    Panic attack    Pulmonary hypertension (HCC)    TR (tricuspid regurgitation)    Past Surgical History:  Procedure Laterality Date   CARDIOVERSION  N/A 12/25/2015   Procedure: CARDIOVERSION;  Surgeon: Mardell Shade, MD;  Location: [facility] ENDOSCOPY;  Service: Cardiovascular;  Laterality: N/A;   CARDIOVERSION N/A 01/04/2018   Procedure: CARDIOVERSION;  Surgeon: Mardell Shade, MD;  Location: [facility] ENDOSCOPY;  Service: Cardiovascular;  Laterality: N/A;   HERNIA REPAIR     REPLACEMENT TOTAL KNEE Right 2009   TONSILLECTOMY     TOTAL KNEE ARTHROPLASTY Left    TOTAL KNEE REVISION Right 07/06/2018   Procedure: RIGHT TOTAL KNEE REVISION;  Surgeon: Liliane Rei, MD;  Location: [facility] ORS;  Service: Orthopedics;  Laterality: Right;    Social History:  reports that she has never smoked. She has never used smokeless tobacco. She reports that she does not drink alcohol  and does not use drugs.  Allergies  Allergen Reactions   Statins Other (See Comments)    Kidney failure    Sulfamethoxazole-Trimethoprim Other (See Comments) and Anaphylaxis    Kidney failure   Gramineae Pollens Other (See Comments)    Sinus congestion.     Nitrofurantoin Dermatitis and Itching    Family History  Problem Relation Age of Onset   Breast cancer Mother    Hypertension Sister     Prior to Admission medications   Medication Sig Start Date End Date Taking? Authorizing Provider  acetaminophen  (TYLENOL ) 650 MG CR tablet Take 1,300 mg by mouth as needed for pain.   Yes [provider]  ALPRAZolam  (XANAX )  0.25 MG tablet Take 0.25 mg by mouth at bedtime as needed for anxiety or sleep. 03/20/21  Yes [provider]  Ascorbic Acid  Buffered (BUFFERED VITAMIN C ) 1000 MG CAPS Take 1,000 mg by mouth daily. 07/28/23  Yes [provider]  aspirin  81 MG EC tablet Take 1 tablet by mouth daily. 12/20/20  Yes [provider]  digoxin  (LANOXIN ) 0.125 MG tablet Take 62.5 mcg by mouth daily. 09/06/23  Yes [provider]  DULoxetine  (CYMBALTA ) 60 MG capsule Take 60 mg by mouth daily. 01/26/22  Yes [provider]  ELIQUIS  5 MG  TABS tablet TAKE 1 TABLET BY MOUTH TWICE DAILY 03/30/19  Yes Bensimhon, Rheta Celestine, MD  ENTRESTO  24-26 MG Take 1 tablet by mouth 2 (two) times daily. 02/02/22  Yes [provider]  ferrous sulfate  325 (65 FE) MG tablet Take 3 tablets by mouth daily.   Yes [provider]  fluticasone  (FLONASE ) 50 MCG/ACT nasal spray Place 1 spray into both nostrils as needed for allergies. 09/13/18  Yes [provider]  furosemide  (LASIX ) 20 MG tablet Take 20 mg by mouth See admin instructions. 20 mg daily and 40 mg every other day 11/15/21  Yes [provider]  gabapentin  (NEURONTIN ) 100 MG capsule Take 100 mg by mouth in the morning. 02/13/22  Yes [provider]  gabapentin  (NEURONTIN ) 300 MG capsule Take 1 capsule (300 mg total) by mouth 2 (two) times daily. Patient taking differently: Take 300 mg by mouth at bedtime. 04/14/18  Yes Armenta Landau, MD  loratadine  (CLARITIN ) 10 MG tablet Take 10 mg by mouth daily as needed for allergies.    Yes [provider]  Menthol , Topical Analgesic, (BIOFREEZE EX) Apply 1 application topically 2 (two) times daily as needed (pain).   Yes [provider]  metoprolol  succinate (TOPROL -XL) 25 MG 24 hr tablet Take 12.5 mg by mouth daily. 01/26/22  Yes [provider]  oxybutynin  (DITROPAN ) 5 MG tablet Take 5 mg by mouth 2 (two) times daily. 11/24/21  Yes [provider]  spironolactone  (ALDACTONE ) 25 MG tablet Take 25 mg by mouth daily. 01/12/22  Yes [provider]  traMADol  (ULTRAM ) 50 MG tablet Take 50 mg by mouth as needed for moderate pain (pain score 4-6). 01/05/22  Yes [provider]    Physical Exam: Vitals:   02/23/24 0748 02/23/24 1211 02/23/24 1300 02/23/24 1531  BP:  (!) 178/95 (!) 162/95 (!) 157/80  Pulse:  80 80 73  Resp:  (!) 27 (!) 32 19  Temp: 98 F (36.7 C) 98.2 F (36.8 C)  98.2 F (36.8 C)  TempSrc: Oral Oral  Oral  SpO2:  97% 98% 97%  Weight:      Height:        Physical Exam HENT:     Head: Normocephalic.  Cardiovascular:     Rate and Rhythm: Normal rate and regular rhythm.  Pulmonary:     Breath sounds: Wheezing and rales present.  Abdominal:     Palpations: Abdomen is soft.  Musculoskeletal:        General: Normal range of motion.  Skin:    General: Skin is warm and dry.  Neurological:     Mental Status: She is alert.  Psychiatric:        Mood and Affect: Mood normal.    Assessment and Plan: Acute systolic CHF - IV lasix  20 mg bid  - Aldactone  25 mg PO daily  - ECHO - Cardiac diet  and 1.5 L daily fluid restriction  - Daily weights  - Strict I & O   HTN  - As above  - Toprol  XL 12.5 mg PO daily   Neuropathy - Gabapentin  100 mg PO daily and 300 mg PO at bedtime  - Cymbalta  60 mg PO daily   Chronic afib  - Eliquis  5 mg PO bid  - Toprol  XL 12.5 mg PO daily  - Digoxin  62.5 mcg PO daily   Family Communication: Daughter at bedside during H&P process  Severity of Illness: The appropriate patient status for this patient is INPATIENT. Inpatient status is judged to be reasonable and necessary in order to provide the required intensity of service to ensure the patient's safety. The patient's presenting symptoms, physical exam findings, and initial radiographic and laboratory data in the context of their chronic comorbidities is felt to place them at high risk for further clinical deterioration. Furthermore, it is not anticipated that the patient will be medically stable for discharge from the hospital within 2 midnights of admission.   * I certify that at the point of admission it is my clinical judgment that the patient will require inpatient hospital care spanning beyond 2 midnights from the point of admission due to high intensity of service, high risk for further deterioration and high frequency of surveillance required.*  Author: Bryten Maher , MD 02/23/2024 3:53 PM  For on call review [email or web address].

## 2024-02-23 NOTE — Plan of Care (Signed)

## 2024-02-23 NOTE — Progress Notes (Signed)
 PHARMACY ROUNDING NOTE  RACHNA SCHONBERGER is a 78 y.o. female awaiting admission. A chart review was completed to evaluate prior to admission medications, antibiotic therapy and labs/vitals. The following interventions were made:  Restarted apixaban   This was discussed with the ED or admitting provider and/or nurse/paramedic.   Jerri Morale, PharmD, BCPS, BCEMP Clinical Pharmacist Please see AMION for all pharmacy numbers 02/23/2024 9:29 AM

## 2024-02-23 NOTE — TOC CM/SW Note (Signed)
 Transition of Care [facility]) - Inpatient Brief Assessment   Patient Details  Name: Kelly Dickson MRN: [id number] Date of Birth: 10-10-1946  [facility] CM/SW Contact:    Jennett Model, RN Phone Number: 02/23/2024, 3:31 PM   Clinical Narrative: Patient gives NCM permission to speak with her daughter in the room, Agnes Alderman, From home alone at [employer], has PCP and insurance on file, states has no HH services in place at this time , has rollator at home.  States daughter  will transport them home at [employer] and family is support system, states gets medications from [facility] in [place].   Pta self ambulatory with rollator.    Transition of Care Asessment: Insurance and Status: Insurance coverage has been reviewed Patient has primary care physician: Yes Home environment has been reviewed: [employer] Prior level of function:: indep Prior/Current Home Services: Current home services (rollator) Social Drivers of Health Review: SDOH reviewed no interventions necessary Readmission risk has been reviewed: Yes Transition of care needs: no transition of care needs at this time

## 2024-02-23 NOTE — ED Notes (Signed)
 Called [facility] for transfer to [facility] @11 :50am.  Spoke with Caretha Chapel.

## 2024-02-23 NOTE — Progress Notes (Signed)
 Pt arrived to 3E, VSS, AAOx4, RA, oriented to room, daughter at bedside.   Keane Passe, RN 02/23/2024 3:48 PM

## 2024-02-24 ENCOUNTER — Inpatient Hospital Stay (HOSPITAL_COMMUNITY)

## 2024-02-24 DIAGNOSIS — I5023 Acute on chronic systolic (congestive) heart failure: Secondary | ICD-10-CM

## 2024-02-24 LAB — ECHOCARDIOGRAM COMPLETE
AR max vel: 2.97 cm2
AV Area VTI: 2.54 cm2
AV Area mean vel: 2.97 cm2
AV Mean grad: 7.5 mmHg
AV Peak grad: 14.3 mmHg
Ao pk vel: 1.89 m/s
Area-P 1/2: 2.32 cm2
Calc EF: 54.9 %
Height: 62 in
MV VTI: 4.18 cm2
S' Lateral: 3.4 cm
Single Plane A2C EF: 42.8 %
Single Plane A4C EF: 62 %
Weight: 2659.63 [oz_av]

## 2024-02-24 LAB — CBC
HCT: 37.8 % (ref 36.0–46.0)
Hemoglobin: 12.2 g/dL (ref 12.0–15.0)
MCH: 29 pg (ref 26.0–34.0)
MCHC: 32.3 g/dL (ref 30.0–36.0)
MCV: 90 fL (ref 80.0–100.0)
Platelets: 144 10*3/uL — ABNORMAL LOW (ref 150–400)
RBC: 4.2 MIL/uL (ref 3.87–5.11)
RDW: 14.5 % (ref 11.5–15.5)
WBC: 5.4 10*3/uL (ref 4.0–10.5)
nRBC: 0 % (ref 0.0–0.2)

## 2024-02-24 LAB — COMPREHENSIVE METABOLIC PANEL WITH GFR
ALT: 12 U/L (ref 0–44)
AST: 19 U/L (ref 15–41)
Albumin: 3.1 g/dL — ABNORMAL LOW (ref 3.5–5.0)
Alkaline Phosphatase: 55 U/L (ref 38–126)
Anion gap: 12 (ref 5–15)
BUN: 21 mg/dL (ref 8–23)
CO2: 28 mmol/L (ref 22–32)
Calcium: 8.8 mg/dL — ABNORMAL LOW (ref 8.9–10.3)
Chloride: 97 mmol/L — ABNORMAL LOW (ref 98–111)
Creatinine, Ser: 0.92 mg/dL (ref 0.44–1.00)
GFR, Estimated: >60 mL/min (ref >60)
Glucose, Bld: 114 mg/dL — ABNORMAL HIGH (ref 70–99)
Potassium: 3.8 mmol/L (ref 3.5–5.1)
Sodium: 137 mmol/L (ref 135–145)
Total Bilirubin: 0.6 mg/dL (ref 0.0–1.2)
Total Protein: 6.3 g/dL — ABNORMAL LOW (ref 6.5–8.1)

## 2024-02-24 LAB — MAGNESIUM: Magnesium: 1.8 mg/dL (ref 1.7–2.4)

## 2024-02-24 LAB — DIGOXIN LEVEL: Digoxin Level: <0.4 ng/mL — ABNORMAL LOW (ref 0.8–2.0)

## 2024-02-24 LAB — PHOSPHORUS: Phosphorus: 2.9 mg/dL (ref 2.5–4.6)

## 2024-02-24 MED ORDER — DM-GUAIFENESIN ER 30-600 MG PO TB12
1.0000 | ORAL_TABLET | Freq: Two times a day (BID) | ORAL | Status: DC
  Administered 2024-02-24 – 2024-02-25 (×3): 1 via ORAL
  Filled 2024-02-24 (×3): qty 1

## 2024-02-24 MED ORDER — IPRATROPIUM-ALBUTEROL 0.5-2.5 (3) MG/3ML IN SOLN
3.0000 mL | Freq: Three times a day (TID) | RESPIRATORY_TRACT | Status: DC
  Administered 2024-02-24 – 2024-02-25 (×3): 3 mL via RESPIRATORY_TRACT
  Filled 2024-02-24 (×3): qty 3

## 2024-02-24 NOTE — Plan of Care (Signed)
   Problem: Clinical Measurements: Goal: Respiratory complications will improve Outcome: Progressing   Problem: Activity: Goal: Risk for activity intolerance will decrease Outcome: Progressing

## 2024-02-24 NOTE — Progress Notes (Signed)
 Mobility Specialist Progress Note:   02/24/24 1023  Mobility  Activity Ambulated with assistance in hallway  Level of Assistance Contact guard assist, steadying assist  Assistive Device Front wheel walker  Distance Ambulated (ft) 250 ft  Activity Response Tolerated well  Mobility Referral Yes  Mobility visit 1 Mobility  Mobility Specialist Start Time (ACUTE ONLY) 1023  Mobility Specialist Stop Time (ACUTE ONLY) 1035  Mobility Specialist Time Calculation (min) (ACUTE ONLY) 12 min   Pt agreeable to mobility session. Required only minG assist to ambulate with RW. VSS on RA throughout ambulation. Pt with no c/o throughout. Back in chair with all needs met.   Oneda Big Mobility Specialist Please contact via SecureChat or  Rehab office at [phone]

## 2024-02-24 NOTE — Progress Notes (Signed)
 Heart Failure Navigator Progress Note  Assessed for Heart & Vascular TOC clinic readiness.  Patient does not meet criteria due to patient is seen by [facility]. No HF TOC. .   Navigator will sign off at this time.   Randie Bustle, BSN, Scientist, clinical (histocompatibility and immunogenetics) Only

## 2024-02-24 NOTE — Progress Notes (Signed)
  Progress Note   Patient: Kelly Dickson VQQ:[id number] DOB: 1946/10/09 DOA: 02/22/2024     1 DOS: the patient was seen and examined on 02/24/2024  Assessment and Plan: Acute systolic CHF - IV lasix  20 mg bid  - Entresto  24-26 mg PO bid - Aldactone  25 mg PO daily  - ECHO pending - Cardiac diet and 1.5 L daily fluid restriction  - Daily weights  - Strict I & O    HTN  - As above  - Toprol  XL 12.5 mg PO daily    Neuropathy - Gabapentin  100 mg PO daily and 300 mg PO at bedtime  - Cymbalta  60 mg PO daily    Chronic afib  - Eliquis  5 mg PO bid  - Toprol  XL 12.5 mg PO daily  - Digoxin  62.5 mcg PO daily   5. Metapneumovirus +  - Mucinex  DM 1 tab PO bid  - Duoneb tid and q6 hr PRN   Subjective: Pt seen and examined at the bedside. Extended resp panel + for metapneumovirus.  Pt started on mucinex  and standing duonebs.  Continue with IV lasix .  Physical Exam: Vitals:   02/23/24 2356 02/23/24 2357 02/24/24 0720 02/24/24 0800  BP:   130/70 127/77  Pulse: 84 82 70   Resp: (!) 28 (!) 28 17   Temp:   97.7 F (36.5 C) 98.7 F (37.1 C)  TempSrc:   Oral Oral  SpO2: 94% 94% 93%   Weight:      Height:       HENT:     Head: Normocephalic.  Cardiovascular:     Rate and Rhythm: Normal rate and regular rhythm.  Pulmonary:     Breath sounds: Rales b/l Abdominal:     Palpations: Abdomen is soft.  Musculoskeletal:        General: Normal range of motion.  Skin:    General: Skin is warm and dry.  Neurological:     Mental Status: She is alert.  Psychiatric:        Mood and Affect: Mood normal.     Disposition: Status is: Inpatient Remains inpatient appropriate because: IV lasix  and breathing tx  Planned Discharge Destination: Home     Time spent: 35 minutes  Author: Ruhani Umland , MD 02/24/2024 9:18 AM  For on call review [email or web address].

## 2024-02-24 NOTE — Plan of Care (Signed)

## 2024-02-24 NOTE — Progress Notes (Signed)
  Echocardiogram 2D Echocardiogram has been performed.  Kelly Dickson 02/24/2024, 3:21 PM

## 2024-02-25 DIAGNOSIS — I5023 Acute on chronic systolic (congestive) heart failure: Secondary | ICD-10-CM | POA: Diagnosis not present

## 2024-02-25 LAB — MAGNESIUM: Magnesium: 2 mg/dL (ref 1.7–2.4)

## 2024-02-25 LAB — COMPREHENSIVE METABOLIC PANEL WITH GFR
ALT: 11 U/L (ref 0–44)
AST: 16 U/L (ref 15–41)
Albumin: 3 g/dL — ABNORMAL LOW (ref 3.5–5.0)
Alkaline Phosphatase: 55 U/L (ref 38–126)
Anion gap: 10 (ref 5–15)
BUN: 30 mg/dL — ABNORMAL HIGH (ref 8–23)
CO2: 28 mmol/L (ref 22–32)
Calcium: 8.5 mg/dL — ABNORMAL LOW (ref 8.9–10.3)
Chloride: 98 mmol/L (ref 98–111)
Creatinine, Ser: 1.05 mg/dL — ABNORMAL HIGH (ref 0.44–1.00)
GFR, Estimated: 54 mL/min — ABNORMAL LOW (ref >60)
Glucose, Bld: 104 mg/dL — ABNORMAL HIGH (ref 70–99)
Potassium: 3.3 mmol/L — ABNORMAL LOW (ref 3.5–5.1)
Sodium: 136 mmol/L (ref 135–145)
Total Bilirubin: 0.7 mg/dL (ref 0.0–1.2)
Total Protein: 5.9 g/dL — ABNORMAL LOW (ref 6.5–8.1)

## 2024-02-25 LAB — PHOSPHORUS: Phosphorus: 3.7 mg/dL (ref 2.5–4.6)

## 2024-02-25 MED ORDER — POTASSIUM CHLORIDE CRYS ER 20 MEQ PO TBCR
40.0000 meq | EXTENDED_RELEASE_TABLET | Freq: Once | ORAL | Status: AC
  Administered 2024-02-25: 40 meq via ORAL
  Filled 2024-02-25: qty 2

## 2024-02-25 MED ORDER — POTASSIUM CHLORIDE 20 MEQ PO PACK: 40.0000 meq | PACK | Freq: Once | ORAL | Status: DC

## 2024-02-25 MED ORDER — DM-GUAIFENESIN ER 30-600 MG PO TB12: 1.0000 | ORAL_TABLET | Freq: Two times a day (BID) | ORAL | 0 refills | Status: AC | PRN

## 2024-02-25 MED ORDER — FUROSEMIDE 20 MG PO TABS: 20.0000 mg | ORAL_TABLET | Freq: Every day | ORAL | Status: DC

## 2024-02-25 MED ORDER — ALBUTEROL SULFATE HFA 108 (90 BASE) MCG/ACT IN AERS: 2.0000 | INHALATION_SPRAY | Freq: Four times a day (QID) | RESPIRATORY_TRACT | 0 refills | Status: AC | PRN

## 2024-02-25 NOTE — Discharge Summary (Signed)
 Physician Discharge Summary   Patient: Kelly Dickson MRN: [id number] DOB: 1946/04/12  Admit date:     02/22/2024  Discharge date: 02/25/24  Discharge Physician: Mickle Albe    PCP: Scarlett Current, PA-C      Discharge Diagnoses: Principal Problem:   Acute on chronic systolic (congestive) heart failure (HCC) Active Problems:   Acute systolic CHF (congestive heart failure) (HCC)  Resolved Problems:   * No resolved hospital problems. *  Hospital Course: 78 yo F treated for acute systolic CHF and also found to be metapneumovirus + (on respiratory panel).  She received IV lasix  20 mg bid, Entresto  24-26 mg PO bid, Aldactone  25 mg PO daily. ECHO showed LVEF 65-70%, LV had normal function and LV had no regional wall motion abnormalities. She was maintained on a cardiac diet and 1.5 L daily fluid restriction, Daily weights and Strict I & O. For metapneumovirus she received mucinex  bid and duoneb tid and q6 hr PRN.  IV lasix  was stopped as BUN/Cr started to rise. Electrolytes were replaced as needed. Pt was able to ambulate 250 ft with PT/OT/mobility specialist. Pt should follow up with her PCP in 1 - 2 weeks.    DISCHARGE MEDICATION: Allergies as of 02/25/2024       Reactions   Statins Other (See Comments)   Kidney failure    Sulfamethoxazole-trimethoprim Other (See Comments), Anaphylaxis   Kidney failure   Gramineae Pollens Other (See Comments)   Sinus congestion.    Nitrofurantoin Dermatitis, Itching        Medication List     TAKE these medications    acetaminophen  650 MG CR tablet Commonly known as: TYLENOL  Take 1,300 mg by mouth as needed for pain.   albuterol  108 (90 Base) MCG/ACT inhaler Commonly known as: VENTOLIN  HFA Inhale 2 puffs into the lungs every 6 (six) hours as needed for up to 5 days for wheezing or shortness of breath.   ALPRAZolam  0.25 MG tablet Commonly known as: XANAX  Take 0.25 mg by mouth at bedtime as needed for anxiety or sleep.   aspirin  EC  81 MG tablet Take 1 tablet by mouth daily.   BIOFREEZE EX Apply 1 application topically 2 (two) times daily as needed (pain).   Buffered Vitamin C  1000 MG Caps Take 1,000 mg by mouth daily.   dextromethorphan -guaiFENesin  30-600 MG 12hr tablet Commonly known as: MUCINEX  DM Take 1 tablet by mouth 2 (two) times daily as needed for up to 5 days for cough.   digoxin  0.125 MG tablet Commonly known as: LANOXIN  Take 62.5 mcg by mouth daily.   DULoxetine  60 MG capsule Commonly known as: CYMBALTA  Take 60 mg by mouth daily.   Eliquis  5 MG Tabs tablet Generic drug: apixaban  TAKE 1 TABLET BY MOUTH TWICE DAILY   Entresto  24-26 MG Generic drug: sacubitril -valsartan  Take 1 tablet by mouth 2 (two) times daily.   ferrous sulfate  325 (65 FE) MG tablet Take 3 tablets by mouth daily.   fluticasone  50 MCG/ACT nasal spray Commonly known as: FLONASE  Place 1 spray into both nostrils as needed for allergies.   furosemide  20 MG tablet Commonly known as: LASIX  Take 20 mg by mouth See admin instructions. 20 mg daily and 40 mg every other day   gabapentin  300 MG capsule Commonly known as: NEURONTIN  Take 1 capsule (300 mg total) by mouth 2 (two) times daily. What changed: when to take this   gabapentin  100 MG capsule Commonly known as: NEURONTIN  Take 100 mg by mouth  in the morning. What changed: Another medication with the same name was changed. Make sure you understand how and when to take each.   loratadine  10 MG tablet Commonly known as: CLARITIN  Take 10 mg by mouth daily as needed for allergies.   metoprolol  succinate 25 MG 24 hr tablet Commonly known as: TOPROL -XL Take 12.5 mg by mouth daily.   oxybutynin  5 MG tablet Commonly known as: DITROPAN  Take 5 mg by mouth 2 (two) times daily.   spironolactone  25 MG tablet Commonly known as: ALDACTONE  Take 25 mg by mouth daily.   traMADol  50 MG tablet Commonly known as: ULTRAM  Take 50 mg by mouth as needed for moderate pain (pain  score 4-6).        Discharge Exam: Filed Weights   02/22/24 1643 02/24/24 1050 02/25/24 0417  Weight: 77.1 kg 75.4 kg 75.6 kg   Physical Exam HENT:     Head: Normocephalic.  Cardiovascular:     Rate and Rhythm: Normal rate and regular rhythm.  Pulmonary:     Effort: Pulmonary effort is normal.  Abdominal:     Palpations: Abdomen is soft.  Musculoskeletal:        General: Normal range of motion.  Skin:    General: Skin is warm.  Neurological:     Mental Status: She is alert.  Psychiatric:        Mood and Affect: Mood normal.      Condition at discharge: fair  The results of significant diagnostics from this hospitalization (including imaging, microbiology, ancillary and laboratory) are listed below for reference.   Imaging Studies: ECHOCARDIOGRAM COMPLETE Result Date: 02/24/2024    ECHOCARDIOGRAM REPORT   Patient Name:   Kelly Dickson Date of Exam: 02/24/2024 Medical Rec #:  [id number]        Height:       62.0 in Accession #:    [id number]       Weight:       166.2 lb Date of Birth:  04-26-1946        BSA:          1.767 m Patient Age:    77 years         BP:           107/66 mmHg Patient Gender: F                HR:           78 bpm. Exam Location:  Inpatient Procedure: 2D Echo, Cardiac Doppler and Color Doppler (Both Spectral and Color            Flow Doppler were utilized during procedure). Indications:    I50.40* Unspecified combined systolic (congestive) and diastolic                 (congestive) heart failure  History:        Patient has prior history of Echocardiogram examinations, most                 recent 04/13/2018. CHF, Abnormal ECG, Pulmonary HTN and TIA,                 Arrythmias:LBBB and Bradycardia; Risk Factors:Hypertension and                 Dyslipidemia.  Sonographer:    Raynelle Callow RDCS Referring Phys: [id number] Finas Delone IMPRESSIONS  1. Left ventricular ejection fraction, by estimation, is 65 to 70%. The left ventricle has normal function. The  left  ventricle has no regional wall motion abnormalities. There is moderate concentric left ventricular hypertrophy. Left ventricular diastolic parameters are indeterminate.  2. Right ventricular systolic function is moderately reduced. The right ventricular size is normal. There is moderately elevated pulmonary artery systolic pressure.  3. Left atrial size was severely dilated.  4. The mitral valve is degenerative. Trivial mitral valve regurgitation. No evidence of mitral stenosis. Moderate mitral annular calcification.  5. The aortic valve is tricuspid. There is moderate calcification of the aortic valve. Aortic valve regurgitation is trivial. Aortic valve sclerosis/calcification is present, without any evidence of aortic stenosis. Aortic valve area, by VTI measures 2.54 cm. Aortic valve mean gradient measures 7.5 mmHg. Aortic valve Vmax measures 1.89 m/s.  6. The inferior vena cava is normal in size with <50% respiratory variability, suggesting right atrial pressure of 8 mmHg. FINDINGS  Left Ventricle: Left ventricular ejection fraction, by estimation, is 65 to 70%. The left ventricle has normal function. The left ventricle has no regional wall motion abnormalities. The left ventricular internal cavity size was normal in size. There is  moderate concentric left ventricular hypertrophy. Left ventricular diastolic parameters are indeterminate. Right Ventricle: The right ventricular size is normal. No increase in right ventricular wall thickness. Right ventricular systolic function is moderately reduced. There is moderately elevated pulmonary artery systolic pressure. The tricuspid regurgitant velocity is 2.77 m/s, and with an assumed right atrial pressure of 15 mmHg, the estimated right ventricular systolic pressure is 45.7 mmHg. Left Atrium: Left atrial size was severely dilated. Right Atrium: Right atrial size was normal in size. Pericardium: There is no evidence of pericardial effusion. Mitral Valve: The mitral  valve is degenerative in appearance. There is mild calcification of the mitral valve leaflet(s). Moderate mitral annular calcification. Trivial mitral valve regurgitation. No evidence of mitral valve stenosis. MV peak gradient, 3.3 mmHg. The mean mitral valve gradient is 1.0 mmHg. Tricuspid Valve: The tricuspid valve is normal in structure. Tricuspid valve regurgitation is mild . No evidence of tricuspid stenosis. Aortic Valve: The aortic valve is tricuspid. There is moderate calcification of the aortic valve. Aortic valve regurgitation is trivial. Aortic valve sclerosis/calcification is present, without any evidence of aortic stenosis. Aortic valve mean gradient measures 7.5 mmHg. Aortic valve peak gradient measures 14.3 mmHg. Aortic valve area, by VTI measures 2.54 cm. Pulmonic Valve: The pulmonic valve was normal in structure. Pulmonic valve regurgitation is mild. No evidence of pulmonic stenosis. Aorta: The aortic root is normal in size and structure. Venous: The inferior vena cava is normal in size with less than 50% respiratory variability, suggesting right atrial pressure of 8 mmHg. IAS/Shunts: No atrial level shunt detected by color flow Doppler. Additional Comments: A device lead is visualized.  LEFT VENTRICLE PLAX 2D LVIDd:         3.90 cm     Diastology LVIDs:         3.40 cm     LV e' medial:    6.53 cm/s LV PW:         1.10 cm     LV E/e' medial:  13.5 LV IVS:        1.80 cm     LV e' lateral:   8.38 cm/s LVOT diam:     2.40 cm     LV E/e' lateral: 10.5 LV SV:         77 LV SV Index:   44 LVOT Area:     4.52 cm  LV Volumes (MOD) LV  vol d, MOD A2C: 64.9 ml LV vol d, MOD A4C: 58.0 ml LV vol s, MOD A2C: 37.1 ml LV vol s, MOD A4C: 22.0 ml LV SV MOD A2C:     27.8 ml LV SV MOD A4C:     58.0 ml LV SV MOD BP:      35.3 ml RIGHT VENTRICLE            IVC RV S prime:     7.62 cm/s  IVC diam: 1.70 cm TAPSE (M-mode): 0.7 cm LEFT ATRIUM              Index        RIGHT ATRIUM           Index LA diam:        4.30 cm   2.43 cm/m   RA Area:     19.20 cm LA Vol (A2C):   163.0 ml 92.24 ml/m  RA Volume:   60.90 ml  34.46 ml/m LA Vol (A4C):   99.2 ml  56.14 ml/m LA Biplane Vol: 125.0 ml 70.73 ml/m  AORTIC VALVE AV Area (Vmax):    2.97 cm AV Area (Vmean):   2.97 cm AV Area (VTI):     2.54 cm AV Vmax:           189.00 cm/s AV Vmean:          123.500 cm/s AV VTI:            0.303 m AV Peak Grad:      14.3 mmHg AV Mean Grad:      7.5 mmHg LVOT Vmax:         124.00 cm/s LVOT Vmean:        81.200 cm/s LVOT VTI:          0.170 m LVOT/AV VTI ratio: 0.56  AORTA Ao Root diam: 3.10 cm Ao Asc diam:  3.40 cm MITRAL VALVE               TRICUSPID VALVE MV Area (PHT): 2.32 cm    TR Peak grad:   30.7 mmHg MV Area VTI:   4.18 cm    TR Vmax:        277.00 cm/s MV Peak grad:  3.3 mmHg MV Mean grad:  1.0 mmHg    SHUNTS MV Vmax:       0.91 m/s    Systemic VTI:  0.17 m MV Vmean:      54.2 cm/s   Systemic Diam: 2.40 cm MV Decel Time: 327 msec MV E velocity: 88.00 cm/s Jules Oar MD Electronically signed by Jules Oar MD Signature Date/Time: 02/24/2024/3:39:05 PM    Final    DG Chest Portable 1 View Result Date: 02/22/2024 CLINICAL DATA:  Shortness of breath and coughing for several days. EXAM: PORTABLE CHEST 1 VIEW COMPARISON:  August 03, 2018. FINDINGS: Mild cardiomegaly. Left-sided pacemaker is in grossly good position. Both lungs are clear. The visualized skeletal structures are unremarkable. IMPRESSION: No active disease. Electronically Signed   By: Rosalene Colon M.D.   On: 02/22/2024 17:51    Microbiology: Results for orders placed or performed during the hospital encounter of 02/22/24  Resp panel by RT-PCR (RSV, Flu A&B, Covid) Anterior Nasal Swab     Status: None   Collection Time: 02/22/24  4:59 PM   Specimen: Anterior Nasal Swab  Result Value Ref Range Status   SARS Coronavirus 2 by RT PCR NEGATIVE NEGATIVE Final    Comment: (NOTE)  SARS-CoV-2 target nucleic acids are NOT DETECTED.  The SARS-CoV-2 RNA is  generally detectable in upper respiratory specimens during the acute phase of infection. The lowest concentration of SARS-CoV-2 viral copies this assay can detect is 138 copies/mL. A negative result does not preclude SARS-Cov-2 infection and should not be used as the sole basis for treatment or other patient management decisions. A negative result may occur with  improper specimen collection/handling, submission of specimen other than nasopharyngeal swab, presence of viral mutation(s) within the areas targeted by this assay, and inadequate number of viral copies(<138 copies/mL). A negative result must be combined with clinical observations, patient history, and epidemiological information. The expected result is Negative.  Fact Sheet for Patients:  BloggerCourse.com  Fact Sheet for Healthcare Providers:  SeriousBroker.it  This test is no t yet approved or cleared by the [place]  FDA and  has been authorized for detection and/or diagnosis of SARS-CoV-2 by FDA under an Emergency Use Authorization (EUA). This EUA will remain  in effect (meaning this test can be used) for the duration of the COVID-19 declaration under Section 564(b)(1) of the Act, 21 U.S.C.section 360bbb-3(b)(1), unless the authorization is terminated  or revoked sooner.       Influenza A by PCR NEGATIVE NEGATIVE Final   Influenza B by PCR NEGATIVE NEGATIVE Final    Comment: (NOTE) The Xpert Xpress SARS-CoV-2/FLU/RSV plus assay is intended as an aid in the diagnosis of influenza from Nasopharyngeal swab specimens and should not be used as a sole basis for treatment. Nasal washings and aspirates are unacceptable for Xpert Xpress SARS-CoV-2/FLU/RSV testing.  Fact Sheet for Patients: BloggerCourse.com  Fact Sheet for Healthcare Providers: SeriousBroker.it  This test is not yet approved or cleared by the [place] FDA and has been authorized for detection and/or diagnosis of SARS-CoV-2 by FDA under an Emergency Use Authorization (EUA). This EUA will remain in effect (meaning this test can be used) for the duration of the COVID-19 declaration under Section 564(b)(1) of the Act, 21 U.S.C. section 360bbb-3(b)(1), unless the authorization is terminated or revoked.     Resp Syncytial Virus by PCR NEGATIVE NEGATIVE Final    Comment: (NOTE) Fact Sheet for Patients: BloggerCourse.com  Fact Sheet for Healthcare Providers: SeriousBroker.it  This test is not yet approved or cleared by the [place]  FDA and has been authorized for detection and/or diagnosis of SARS-CoV-2 by FDA under an Emergency Use Authorization (EUA). This EUA will remain in effect (meaning this test can be used) for the duration of the COVID-19 declaration under Section 564(b)(1) of the Act, 21 U.S.C. section 360bbb-3(b)(1), unless the authorization is terminated or revoked.  Performed at [facility], [address] [place], [place] [address]   Respiratory (~20 pathogens) panel by PCR     Status: Abnormal   Collection Time: 02/23/24  3:46 PM   Specimen: Nasopharyngeal Swab; Respiratory  Result Value Ref Range Status   Adenovirus NOT DETECTED NOT DETECTED Final   Coronavirus 229E NOT DETECTED NOT DETECTED Final    Comment: (NOTE) The Coronavirus on the Respiratory Panel, DOES NOT test for the novel  Coronavirus (2019 nCoV)    Coronavirus HKU1 NOT DETECTED NOT DETECTED Final   Coronavirus NL63 NOT DETECTED NOT DETECTED Final   Coronavirus OC43 NOT DETECTED NOT DETECTED Final   Metapneumovirus DETECTED (A) NOT DETECTED Final   Rhinovirus / Enterovirus NOT DETECTED NOT DETECTED Final   Influenza A NOT DETECTED NOT DETECTED Final   Influenza B NOT DETECTED  NOT DETECTED Final   Parainfluenza Virus 1 NOT DETECTED NOT DETECTED Final   Parainfluenza Virus 2  NOT DETECTED NOT DETECTED Final   Parainfluenza Virus 3 NOT DETECTED NOT DETECTED Final   Parainfluenza Virus 4 NOT DETECTED NOT DETECTED Final   Respiratory Syncytial Virus NOT DETECTED NOT DETECTED Final   Bordetella pertussis NOT DETECTED NOT DETECTED Final   Bordetella Parapertussis NOT DETECTED NOT DETECTED Final   Chlamydophila pneumoniae NOT DETECTED NOT DETECTED Final   Mycoplasma pneumoniae NOT DETECTED NOT DETECTED Final    Comment: Performed at [facility], [address], [place], [place] [address]    Labs: CBC: Recent Labs  Lab 02/22/24 1658 02/24/24 0247  WBC 7.9 5.4  NEUTROABS 6.6  --   HGB 12.5 12.2  HCT 38.9 37.8  MCV 90.5 90.0  PLT 163 144*   Basic Metabolic Panel: Recent Labs  Lab 02/22/24 1658 02/22/24 2142 02/24/24 0247 02/25/24 0321  NA 138 137 137 136  K 4.3 3.8 3.8 3.3*  CL 100 99 97* 98  CO2 21* 22 28 28   GLUCOSE 144* 120* 114* 104*  BUN 25* 25* 21 30*  CREATININE 1.01* 0.93 0.92 1.05*  CALCIUM  9.6 9.3 8.8* 8.5*  MG  --   --  1.8 2.0  PHOS  --   --  2.9 3.7   Liver Function Tests: Recent Labs  Lab 02/22/24 1658 02/24/24 0247 02/25/24 0321  AST 21 19 16   ALT 13 12 11   ALKPHOS 91 55 55  BILITOT 0.6 0.6 0.7  PROT 7.5 6.3* 5.9*  ALBUMIN 4.4 3.1* 3.0*   CBG: No results for input(s): "GLUCAP" in the last 168 hours.  Discharge time spent: greater than 30 minutes.  Signed: Zyrah Wiswell , MD [facility] 02/25/2024

## 2024-02-25 NOTE — TOC Transition Note (Signed)
 Transition of Care [facility]) - Discharge Note   Patient Details  Name: Kelly Dickson MRN: [id number] Date of Birth: Nov 22, 1945  [facility] of [facility]) CM/SW Contact:  Jennett Model, RN Phone Number: 02/25/2024, 10:09 AM   Clinical Narrative:    For dc today, returning to [facility], has no needs. Has transportation.         Patient Goals and CMS Choice            Discharge Placement                       Discharge Plan and Services Additional resources added to the After Visit Summary for                                       Social Drivers of Health (SDOH) Interventions SDOH Screenings   Food Insecurity: No Food Insecurity (02/23/2024)  Housing: Unknown (02/23/2024)  Transportation Needs: No Transportation Needs (02/23/2024)  Utilities: Not At Risk (02/23/2024)  Social Connections: Moderately Integrated (02/23/2024)  Tobacco Use: Low Risk  (02/22/2024)     Readmission Risk Interventions     No data to display

## 2024-02-25 NOTE — Plan of Care (Signed)

## 2024-02-25 NOTE — Progress Notes (Signed)
 Mobility Specialist Progress Note:   02/25/24 0932  Mobility  Activity Ambulated with assistance in hallway  Level of Assistance Contact guard assist, steadying assist  Assistive Device Front wheel walker  Distance Ambulated (ft) 250 ft  Activity Response Tolerated well  Mobility Referral Yes  Mobility visit 1 Mobility  Mobility Specialist Start Time (ACUTE ONLY) 0932  Mobility Specialist Stop Time (ACUTE ONLY) 0950  Mobility Specialist Time Calculation (min) (ACUTE ONLY) 18 min   Pt agreeable to mobility session. Required only minG to ambulate with RW. No c/o throughout. Pt back in chair with all needs met.   Oneda Big Mobility Specialist Please contact via SecureChat or  Rehab office at [phone]
